# Patient Record
Sex: Male | Born: 1975 | Race: Black or African American | Hispanic: No | Marital: Single | Smoking: Current some day smoker
Health system: Southern US, Community
[De-identification: ages and names within clinical notes are randomized; demographics above are authoritative.]

## PROBLEM LIST (undated history)

## (undated) ENCOUNTER — Emergency Department (HOSPITAL_COMMUNITY): Discharge status: Against Medical Advice | Payer: No Typology Code available for payment source

## (undated) DIAGNOSIS — F329 Major depressive disorder, single episode, unspecified: Secondary | ICD-10-CM

## (undated) DIAGNOSIS — F200 Paranoid schizophrenia: Secondary | ICD-10-CM

## (undated) DIAGNOSIS — S42309A Unspecified fracture of shaft of humerus, unspecified arm, initial encounter for closed fracture: Secondary | ICD-10-CM

## (undated) DIAGNOSIS — M792 Neuralgia and neuritis, unspecified: Secondary | ICD-10-CM

## (undated) DIAGNOSIS — F419 Anxiety disorder, unspecified: Secondary | ICD-10-CM

## (undated) DIAGNOSIS — F32A Depression, unspecified: Secondary | ICD-10-CM

## (undated) DIAGNOSIS — F4481 Dissociative identity disorder: Secondary | ICD-10-CM

## (undated) DIAGNOSIS — F4310 Post-traumatic stress disorder, unspecified: Secondary | ICD-10-CM

## (undated) HISTORY — DX: Paranoid schizophrenia: F20.0

## (undated) HISTORY — PX: KNEE SURGERY: SHX244

---

## 2015-07-18 ENCOUNTER — Emergency Department (HOSPITAL_COMMUNITY)
Admission: EM | Admit: 2015-07-18 | Discharge: 2015-07-18 | Disposition: A | Discharge status: Home / Self Care | Payer: Self-pay | Attending: Emergency Medicine | Admitting: Emergency Medicine

## 2015-07-18 ENCOUNTER — Emergency Department (HOSPITAL_COMMUNITY): Payer: Self-pay

## 2015-07-18 ENCOUNTER — Encounter (HOSPITAL_COMMUNITY): Payer: Self-pay | Admitting: *Deleted

## 2015-07-18 DIAGNOSIS — K219 Gastro-esophageal reflux disease without esophagitis: Secondary | ICD-10-CM | POA: Insufficient documentation

## 2015-07-18 DIAGNOSIS — R066 Hiccough: Secondary | ICD-10-CM

## 2015-07-18 DIAGNOSIS — Z760 Encounter for issue of repeat prescription: Secondary | ICD-10-CM | POA: Insufficient documentation

## 2015-07-18 DIAGNOSIS — F329 Major depressive disorder, single episode, unspecified: Secondary | ICD-10-CM | POA: Insufficient documentation

## 2015-07-18 DIAGNOSIS — F17210 Nicotine dependence, cigarettes, uncomplicated: Secondary | ICD-10-CM | POA: Insufficient documentation

## 2015-07-18 HISTORY — DX: Major depressive disorder, single episode, unspecified: F32.9

## 2015-07-18 HISTORY — DX: Dissociative identity disorder: F44.81

## 2015-07-18 HISTORY — DX: Post-traumatic stress disorder, unspecified: F43.10

## 2015-07-18 HISTORY — DX: Anxiety disorder, unspecified: F41.9

## 2015-07-18 HISTORY — DX: Neuralgia and neuritis, unspecified: M79.2

## 2015-07-18 HISTORY — DX: Depression, unspecified: F32.A

## 2015-07-18 LAB — BASIC METABOLIC PANEL
ANION GAP: 6 (ref 5–15)
BUN: 10 mg/dL (ref 6–20)
CHLORIDE: 107 mmol/L (ref 101–111)
CO2: 26 mmol/L (ref 22–32)
Calcium: 9.1 mg/dL (ref 8.9–10.3)
Creatinine, Ser: 0.9 mg/dL (ref 0.61–1.24)
GFR calc Af Amer: >60 mL/min (ref >60)
GLUCOSE: 90 mg/dL (ref 65–99)
POTASSIUM: 4.2 mmol/L (ref 3.5–5.1)
SODIUM: 139 mmol/L (ref 135–145)

## 2015-07-18 MED ORDER — CHLORPROMAZINE HCL 25 MG PO TABS
25.0000 mg | ORAL_TABLET | Freq: Once | ORAL | Status: AC
  Administered 2015-07-18: 25 mg via ORAL
  Filled 2015-07-18: qty 1

## 2015-07-18 MED ORDER — PANTOPRAZOLE SODIUM 40 MG PO TBEC
40.0000 mg | DELAYED_RELEASE_TABLET | Freq: Once | ORAL | Status: AC
  Administered 2015-07-18: 40 mg via ORAL
  Filled 2015-07-18: qty 1

## 2015-07-18 MED ORDER — FAMOTIDINE 20 MG PO TABS: 20.0000 mg | ORAL_TABLET | Freq: Two times a day (BID) | ORAL | Status: DC

## 2015-07-18 MED ORDER — GABAPENTIN 100 MG PO CAPS: 300.0000 mg | ORAL_CAPSULE | Freq: Three times a day (TID) | ORAL | Status: DC

## 2015-07-18 MED ORDER — GI COCKTAIL ~~LOC~~
30.0000 mL | Freq: Once | ORAL | Status: AC
  Administered 2015-07-18: 30 mL via ORAL
  Filled 2015-07-18: qty 30

## 2015-07-18 MED ORDER — CHLORPROMAZINE HCL 25 MG PO TABS: 25.0000 mg | ORAL_TABLET | Freq: Three times a day (TID) | ORAL | Status: DC | PRN

## 2015-07-18 NOTE — ED Notes (Signed)
Per pt report: pt has had hiccups for the past 48 hours.  Pt reports that in the past, pt has had them for a day and they have resolved. Pt also reports having severe nerve pain that is normally managed by Neurontin but pt has no PCP since getting out of prison 3 months ago.  Pt a/o x 4 and ambulatory.

## 2015-07-18 NOTE — ED Provider Notes (Signed)
CSN: [id number]     Arrival date & time 07/18/15  1002 History  By signing my name below, I, Renetta ChalkBobby Ross, attest that this documentation has been prepared under the direction and in the presence of [facility] Jammy Stlouis PA-C.  Electronically Signed: Renetta ChalkBobby Ross, ED Scribe. 07/18/2015. 12:24 PM.   Chief Complaint  Patient presents with  . Hiccups    The history is provided by the patient. No language interpreter was used.    HPI Comments: Danny Lester is a 40 y.o. male who presents to the Emergency Department complaining of unchanged, burning hiccups that have persisted for the past 48 hours. Pt states he has had difficulty sleeping due to the hiccups. Pt states he has had a Hx of hiccups but no episodes has lasted this long. Symptoms always worse at night.  Pt denies eating any spicy foods in the past 3 days. Pt also reports a "sour taste" in the back of his throat and denies taking any medication for acid reflux. Has occasional cough and abdominal discomfort.  Pt has taken TUMS with no relief. Pt states he has had 4 bowel movements per day, for the last 4 days. Pt states he normally has two bowel movements per day. Pt further states he was recently prescribed Zoloft 50mg  and Risperdal 3mg  due to bipolar disorder, multiple personality disorder, paranoid schizophrenia, prescribed [facility].    Pt states he recently was released from a penitentiary and has run out of his nerve medication. Pt states he normally takes Neurontin 900mg  tid but has not taken it in the last 2 weeks. Pt states he was unaware he could go to the doctor without insurance to receive a refill of his medication. Pt reports he takes Neurontin due to a remote neck injury.     Past Medical History  Diagnosis Date  . Nerve pain   . Depression   . PTSD (post-traumatic stress disorder)   . Anxiety   . Multiple personality disorder    Past Surgical History  Procedure Laterality Date  . Knee surgery     No family history on file. Social  History  Substance Use Topics  . Smoking status: Current Every Day Smoker -- 0.10 packs/day    Types: Cigarettes  . Smokeless tobacco: None  . Alcohol Use: Yes    Review of Systems  Constitutional: Negative for fever, activity change and appetite change.  Respiratory: Positive for cough.   Gastrointestinal: Positive for nausea and abdominal pain (discomfort). Negative for vomiting, diarrhea and constipation.  Skin: Positive for rash (back). Negative for color change.  Allergic/Immunologic: Negative for immunocompromised state.  Hematological: Does not bruise/bleed easily.  Psychiatric/Behavioral: Positive for sleep disturbance. Negative for self-injury.    Allergies  Penicillins  Home Medications   Prior to Admission medications   Not on File   BP 124/84 mmHg  Pulse 49  Temp(Src) 98 F (36.7 C) (Oral)  Resp 16  Ht 5\' 6"  (1.676 m)  Wt 170 lb (77.111 kg)  BMI 27.45 kg/m2  SpO2 100% Physical Exam  Constitutional: He appears well-developed and well-nourished. No distress.  HENT:  Head: Normocephalic and atraumatic.  Neck: Neck supple.  Cardiovascular: Normal rate and regular rhythm.   Pulmonary/Chest: Effort normal and breath sounds normal. No respiratory distress. He has no wheezes. He has no rales.  Abdominal: Soft. He exhibits no distension and no mass. There is no tenderness. There is no rebound and no guarding.  Musculoskeletal: Normal range of motion. He exhibits no edema.  Neurological: He is alert. He exhibits normal muscle tone.  Skin: He is not diaphoretic.  Nursing note and vitals reviewed.   ED Course  Procedures  DIAGNOSTIC STUDIES: Oxygen Saturation is 100% on RA, normal by my interpretation.  COORDINATION OF CARE: 12:15 PM-Will order medication and imaging. Discussed treatment plan with pt at bedside and pt agreed to plan.   Labs Review Labs Reviewed  BASIC METABOLIC PANEL  I-STAT CHEM 8, ED    Imaging Review Dg Chest 2 View  07/18/2015   CLINICAL DATA:  Intractable hiccups.  Chest pain EXAM: CHEST  2 VIEW COMPARISON:  None. FINDINGS: Lungs are clear. Heart size and pulmonary vascularity are normal. No adenopathy. No pneumothorax. No bone lesions. IMPRESSION: No edema or consolidation. Electronically Signed   By: Bretta Bang III M.D.   On: 07/18/2015 12:53   I have personally reviewed and evaluated these images and lab results as part of my medical decision-making.   EKG Interpretation None      MDM   Final diagnoses:  Hiccups  Medication refill  Gastroesophageal reflux disease, esophagitis presence not specified   Afebrile, nontoxic patient with hiccups x 48 hours, with frequent hiccups that seem to be associated with reflux symptoms.  GI cocktail, protonix given.  CXR negative.  BMP normal.   Discussed and provided PCP and community resources for the patient.   D/C home with thorazine, pepcid, short term neurontin refill, resources for follow up.  Discussed result, findings, treatment, and follow up  with patient.  Pt given return precautions.  Pt verbalizes understanding and agrees with plan.        I personally performed the services described in this documentation, which was scribed in my presence. The recorded information has been reviewed and is accurate.     Trixie Dredge, PA-C 07/18/15 1458  Vanetta Mulders, MD 07/21/15 1402

## 2015-07-18 NOTE — Discharge Instructions (Signed)
Read the information below.  Use the prescribed medication as directed.  Please discuss all new medications with your pharmacist.  You may return to the Emergency Department at any time for worsening condition or any new symptoms that concern you.     Food Choices for Gastroesophageal Reflux Disease, Adult When you have gastroesophageal reflux disease (GERD), the foods you eat and your eating habits are very important. Choosing the right foods can help ease the discomfort of GERD. WHAT GENERAL GUIDELINES DO I NEED TO FOLLOW?  Choose fruits, vegetables, whole grains, low-fat dairy products, and low-fat meat, fish, and poultry.  Limit fats such as oils, salad dressings, butter, nuts, and avocado.  Keep a food diary to identify foods that cause symptoms.  Avoid foods that cause reflux. These may be different for different people.  Eat frequent small meals instead of three large meals each day.  Eat your meals slowly, in a relaxed setting.  Limit fried foods.  Cook foods using methods other than frying.  Avoid drinking alcohol.  Avoid drinking large amounts of liquids with your meals.  Avoid bending over or lying down until 2-3 hours after eating. WHAT FOODS ARE NOT RECOMMENDED? The following are some foods and drinks that may worsen your symptoms: Vegetables Tomatoes. Tomato juice. Tomato and spaghetti sauce. Chili peppers. Onion and garlic. Horseradish. Fruits Oranges, grapefruit, and lemon (fruit and juice). Meats High-fat meats, fish, and poultry. This includes hot dogs, ribs, ham, sausage, salami, and bacon. Dairy Whole milk and chocolate milk. Sour cream. Cream. Butter. Ice cream. Cream cheese.  Beverages Coffee and tea, with or without caffeine. Carbonated beverages or energy drinks. Condiments Hot sauce. Barbecue sauce.  Sweets/Desserts Chocolate and cocoa. Donuts. Peppermint and spearmint. Fats and Oils High-fat foods, including [place] fries and potato  chips. Other Vinegar. Strong spices, such as black pepper, white pepper, red pepper, cayenne, curry powder, cloves, ginger, and chili powder. The items listed above may not be a complete list of foods and beverages to avoid. Contact your dietitian for more information.   This information is not intended to replace advice given to you by your health care provider. Make sure you discuss any questions you have with your health care provider.   Document Released: 12/26/2004 Document Revised: 01/16/2014 Document Reviewed: 10/30/2012 Elsevier Interactive Patient Education 2016 Elsevier Inc.  Gastroesophageal Reflux Disease, Adult Normally, food travels down the esophagus and stays in the stomach to be digested. However, when a person has gastroesophageal reflux disease (GERD), food and stomach acid move back up into the esophagus. When this happens, the esophagus becomes sore and inflamed. Over time, GERD can create small holes (ulcers) in the lining of the esophagus.  CAUSES This condition is caused by a problem with the muscle between the esophagus and the stomach (lower esophageal sphincter, or LES). Normally, the LES muscle closes after food passes through the esophagus to the stomach. When the LES is weakened or abnormal, it does not close properly, and that allows food and stomach acid to go back up into the esophagus. The LES can be weakened by certain dietary substances, medicines, and medical conditions, including:  Tobacco use.  Pregnancy.  Having a hiatal hernia.  Heavy alcohol use.  Certain foods and beverages, such as coffee, chocolate, onions, and peppermint. RISK FACTORS This condition is more likely to develop in:  People who have an increased body weight.  People who have connective tissue disorders.  People who use NSAID medicines. SYMPTOMS Symptoms of this condition include:  Heartburn.  Difficult or painful swallowing.  The feeling of having a lump in the  throat.  Abitter taste in the mouth.  Bad breath.  Having a large amount of saliva.  Having an upset or bloated stomach.  Belching.  Chest pain.  Shortness of breath or wheezing.  Ongoing (chronic) cough or a night-time cough.  Wearing away of tooth enamel.  Weight loss. Different conditions can cause chest pain. Make sure to see your health care provider if you experience chest pain. DIAGNOSIS Your health care provider will take a medical history and perform a physical exam. To determine if you have mild or severe GERD, your health care provider may also monitor how you respond to treatment. You may also have other tests, including:  An endoscopy toexamine your stomach and esophagus with a small camera.  A test thatmeasures the acidity level in your esophagus.  A test thatmeasures how much pressure is on your esophagus.  A barium swallow or modified barium swallow to show the shape, size, and functioning of your esophagus. TREATMENT The goal of treatment is to help relieve your symptoms and to prevent complications. Treatment for this condition may vary depending on how severe your symptoms are. Your health care provider may recommend:  Changes to your diet.  Medicine.  Surgery. HOME CARE INSTRUCTIONS Diet  Follow a diet as recommended by your health care provider. This may involve avoiding foods and drinks such as:  Coffee and tea (with or without caffeine).  Drinks that containalcohol.  Energy drinks and sports drinks.  Carbonated drinks or sodas.  Chocolate and cocoa.  Peppermint and mint flavorings.  Garlic and onions.  Horseradish.  Spicy and acidic foods, including peppers, chili powder, curry powder, vinegar, hot sauces, and barbecue sauce.  Citrus fruit juices and citrus fruits, such as oranges, lemons, and limes.  Tomato-based foods, such as red sauce, chili, salsa, and pizza with red sauce.  Fried and fatty foods, such as donuts,  french fries, potato chips, and high-fat dressings.  High-fat meats, such as hot dogs and fatty cuts of red and white meats, such as rib eye steak, sausage, ham, and bacon.  High-fat dairy items, such as whole milk, butter, and cream cheese.  Eat small, frequent meals instead of large meals.  Avoid drinking large amounts of liquid with your meals.  Avoid eating meals during the 2-3 hours before bedtime.  Avoid lying down right after you eat.  Do not exercise right after you eat. General Instructions  Pay attention to any changes in your symptoms.  Take over-the-counter and prescription medicines only as told by your health care provider. Do not take aspirin, ibuprofen, or other NSAIDs unless your health care provider told you to do so.  Do not use any tobacco products, including cigarettes, chewing tobacco, and e-cigarettes. If you need help quitting, ask your health care provider.  Wear loose-fitting clothing. Do not wear anything tight around your waist that causes pressure on your abdomen.  Raise (elevate) the head of your bed 6 inches (15cm).  Try to reduce your stress, such as with yoga or meditation. If you need help reducing stress, ask your health care provider.  If you are overweight, reduce your weight to an amount that is healthy for you. Ask your health care provider for guidance about a safe weight loss goal.  Keep all follow-up visits as told by your health care provider. This is important. SEEK MEDICAL CARE IF:  You have new symptoms.  You  have unexplained weight loss.  You have difficulty swallowing, or it hurts to swallow.  You have wheezing or a persistent cough.  Your symptoms do not improve with treatment.  You have a hoarse voice. SEEK IMMEDIATE MEDICAL CARE IF:  You have pain in your arms, neck, jaw, teeth, or back.  You feel sweaty, dizzy, or light-headed.  You have chest pain or shortness of breath.  You vomit and your vomit looks like  blood or coffee grounds.  You faint.  Your stool is bloody or black.  You cannot swallow, drink, or eat.   This information is not intended to replace advice given to you by your health care provider. Make sure you discuss any questions you have with your health care provider.   Document Released: 10/05/2004 Document Revised: 09/16/2014 Document Reviewed: 04/22/2014 Elsevier Interactive Patient Education 2016 [employer].  Hiccups A hiccup is the result of a sudden shortening of the muscle below your lungs (diaphragm). This movement of your diaphragm causes a sudden inhalation followed by the closing of your vocal cords, which causes the hiccup sound. Most people get the hiccups. Typically, hiccups last only a short amount of time.  There are three types of hiccups:   Benign. These hiccups last less than 48 hours.   Persistent. These hiccups last more than 48 hours, but less than 1 month.   Intractable. These hiccups last more than 1 month.  A hiccup is a reflex. You cannot control reflexes.  HOME CARE INSTRUCTIONS  Watch your hiccups for any changes. The following actions may help to lessen any discomfort that you are feeling:  Eat small meals.   Limit alcohol intake to no more than 1 drink per day for nonpregnant women and 2 drinks per day for men. One drink equals 12 oz of beer, 5 oz of wine, or 1 oz of hard liquor.  Limit drinking carbonated or fizzy drinks, such as soda.  Eat and chew your food slowly.   Avoid eating or drinking hot or spicy foods and drinks.  Take medicines only as directed by your health care provider.  SEEK MEDICAL CARE IF:   Your hiccups last for more than 48 hours.   Your hiccups do not improve with treatment.  You cannot sleep or eat due to the hiccups.   You have unexpected weight loss due to the hiccups.   You have a fever.   You have trouble breathing or swallowing.   You develop severe pain in your abdomen.  You  develop numbness, tingling, or weakness.   This information is not intended to replace advice given to you by your health care provider. Make sure you discuss any questions you have with your health care provider.   Document Released: 03/06/2001 Document Revised: 05/12/2014 Document Reviewed: 12/22/2013 Elsevier Interactive Patient Education 2016 [employer].   [employer] Assistance [employer] 211 is a great source of information about community services available.  Access by dialing 2-1-1 from anywhere in  [place], or by website -  PooledIncome.pl.   Other Local Resources (Updated 01/2015)  Financial Assistance   Services    Phone Number and Address  [facility]  Low-cost medical care - 1st and 3rd Saturday of every month  Must not qualify for public or private insurance and must have limited income [phone] [address] [place], [place]    [place] [employer] of Social Services  Child care  Emergency assistance for housing and utilities  Food stamps  Medicaid [phone] [address] [place], [place] [address]   [facility] Department  Low-cost medical care for children, communicable diseases, sexually-transmitted diseases, immunizations, maternity care, womens health and family planning [phone] [address] [place], [place] [address]  [facility]   Medication assistance for [facility] residents  Must meet income requirements [phone] [address] [place], [place].    [facility] Social Services  Child care  Emergency assistance for housing and [employer]  Medicaid [id number] [address] [place], [place] [address]  [facility]   Low-cost medical care,   Monday through Friday, 9 am to 6 pm.   Accepts Medicare/Medicaid, and self-pay [phone] [place], [place] [address]  [facility]  Low-cost medical care - Monday through Friday, 8:30 am - 5:30 pm  Accepts Medicaid and self-pay [phone] [address] [place], [place] [address]   [facility]  Primary medical care, including for those with sickle cell disease  Accepts Medicare, Medicaid, insurance and self-pay [phone] [address] [place], [place]  [facility]   Primary medical care  Accepts Medicare, [place], insurance and self-pay [phone] [address] [place], [place] [address]   [facility] Department of Social Services  Child care  Emergency assistance for housing and [employer]  Medicaid [phone] [address] [place], [place] [address]  [facility] Department of Health and [employer]  Child care  Emergency assistance for housing and [employer]  Medicaid [phone] [address] [place], [place] [address]   [facility] Medication Assistance Program  Medication assistance for [facility] residents with no insurance only  Must have a primary care doctor [phone] [address] [place], [place]  [facility]   Primary medical care  [place], [place], insurance  [phone] [address] [place], [place]  [facility]   Medication assistance [phone]  [facility]   Primary medical care  Accepts Medicare, [place], insurance and self-pay [phone] [address] [place], [place] [address]  [facility]   Primary medical care  Accepts Medicare, [place], insurance and self-pay [phone] [address] [place], [place] [address]  [facility]  For [place] residents between the ages of 73 and 44 who do not have any form of health insurance, Medicare, [place], or [place]  benefits.  Services are provided free of charge to uninsured patients who fall within federal poverty guidelines.    Hours: Tuesdays and Thursdays, 4:15 - 8 pm [phone] [address] [place], [place] [address]  [facility]     Primary medical care  Dental care  Nutritional counseling  Pharmacy  Accepts Medicaid, Medicare, most insurance.  Fees are adjusted based on ability to pay.   [phone] [facility] [address] [place], [place]  [phone] [facility] [address] [place], [place]  [phone] [facility] [place], [place]  [phone] [facility], [address] [place], [place]  [phone] [facility] [address] [place], [place]  [facility]  Womens health and family planning [phone] [address] [place], [place]  [facility] Department of Social Services  Child care  Emergency assistance for housing and utilities  Food stamps  Medicaid [phone] [address] [place], [place] [address]   [facility]    Ages 87 and older  Hours: Mondays and Thursdays, 7:00 am - 9:00 am Patients are seen on a first come, first served basis. [phone], ext. [phone] [place], [place]  [facility] Division of Social Services  Child care  Emergency assistance for housing and [employer]  Medicaid [phone] [address] [place], [place] [address]  [facility]  Medication assistance  Rental assistance  Food pantry  Medication assistance  Housing assistance  Emergency food distribution  Utility assistance [phone] [address] [place], [place]  [phone]  [address] [place], [place] [address] Hours: Tuesdays and Thursdays from 9am - 12 noon by appointment only  [phone] [address] [place], [place] [address]  [facility] - Lanae Boast   Accepts private insurance, [place], and [place].  Payment is based on a sliding scale for those without insurance.  Hours: Mondays, Tuesdays and Thursdays, 8:30 am - 5:30 pm.   [phone] [address] [place], [place]  [facility] - Family Medicine at [facility], [place], and [place].  Payment is based on a sliding scale for those without insurance. [phone] [address] [place], [place]  [facility] - Pediatrics at [address]. Scientist, research (physical sciences), [employer], and [place].  Payment is based on a sliding scale for those without insurance [phone] [address], [employer], [place]  [facility] - Pediatrics at [employer], [place], and [place].  Payment is based on a sliding scale for those without insurance. [phone] [place], [place]  [facility] - Pediatrics at [facility], [place], and [place].  Payment is based on a sliding scale for those without insurance. [phone], ext. [phone] [place], [place].    [facility]  Maternity care.  Accepts Medicaid and self-pay. [phone] [address] [place], [place]

## 2015-07-18 NOTE — ED Notes (Signed)
Bed: WA27 Expected date:  Expected time:  Means of arrival:  Comments: 

## 2015-07-28 ENCOUNTER — Ambulatory Visit: Payer: Self-pay

## 2015-08-19 ENCOUNTER — Encounter (HOSPITAL_COMMUNITY): Payer: Self-pay | Admitting: Emergency Medicine

## 2015-08-19 ENCOUNTER — Emergency Department (HOSPITAL_COMMUNITY)
Admission: EM | Admit: 2015-08-19 | Discharge: 2015-08-19 | Disposition: A | Discharge status: Home / Self Care | Payer: Self-pay | Attending: Emergency Medicine | Admitting: Emergency Medicine

## 2015-08-19 DIAGNOSIS — M5412 Radiculopathy, cervical region: Secondary | ICD-10-CM | POA: Insufficient documentation

## 2015-08-19 DIAGNOSIS — Y999 Unspecified external cause status: Secondary | ICD-10-CM | POA: Insufficient documentation

## 2015-08-19 DIAGNOSIS — Y939 Activity, unspecified: Secondary | ICD-10-CM | POA: Insufficient documentation

## 2015-08-19 DIAGNOSIS — M542 Cervicalgia: Secondary | ICD-10-CM

## 2015-08-19 DIAGNOSIS — Y92149 Unspecified place in prison as the place of occurrence of the external cause: Secondary | ICD-10-CM | POA: Insufficient documentation

## 2015-08-19 DIAGNOSIS — F17210 Nicotine dependence, cigarettes, uncomplicated: Secondary | ICD-10-CM | POA: Insufficient documentation

## 2015-08-19 MED ORDER — GABAPENTIN 300 MG PO CAPS: 300.0000 mg | ORAL_CAPSULE | Freq: Three times a day (TID) | ORAL | 0 refills | Status: DC

## 2015-08-19 MED ORDER — CIPROFLOXACIN-DEXAMETHASONE 0.3-0.1 % OT SUSP: 4.0000 [drp] | Freq: Two times a day (BID) | OTIC | 0 refills | Status: DC

## 2015-08-19 NOTE — Discharge Instructions (Addendum)
Please use medication as directed, please follow-up with neurosurgery for reevaluation further management. Please return to the emergency room immediately if he experiences any new or worsening signs or symptoms

## 2015-08-19 NOTE — Progress Notes (Deleted)
Pt with [facility] ED visits x 2 and no admissions Pt last Ed visit on 07/18/15  Pt seen today by Stacy of [facility]) Given a [facility] application, resources, information on [facility] at [place],  list of [place] self pay and stacy's [facility] contact information for further questions Pt had informed Stacy that during his last ED visit an appt had been made for him at [facility] and he went there and there was not appt scheduled for him. ED CM reviewed pt 07/18/15 d/c instructions to find.  This is not true Pt was given d/c instructions to contact the [facility] and/or [facility] to scheduled himself an appt   

## 2015-08-19 NOTE — ED Triage Notes (Signed)
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 States he was put in a hold while he was in the penitentiary, since then has had tingling and sharp pains in distal extremities. Takes Neurontin but is out. Ambulatory at triage. A/O.

## 2015-08-19 NOTE — Progress Notes (Signed)
[facility] and no admissions Pt last Ed visit on 07/18/15  Pt seen today by Kennyth ArnoldStacy of partnership for community care network [facility]) Given a Passenger transport manager4Cc application, resources, information on [facility] at [place],  [employer] [place] self pay and stacy's [facility] contact information for further questions Pt had informed Kennyth ArnoldStacy that during his last ED visit an app[facility] had been [facility] for [facility] and he went there and there was not appt scheduled for him. ED CM reviewed pt 07/18/15 d/c instructions to find.  This is not true Pt was given d/c instructions [facility] and[facility]or [facility] to scheduled himself an appt

## 2015-08-19 NOTE — ED Provider Notes (Signed)
[facility]-EMERGENCY DEPT Provider Note   CSN: [id number] Arrival date & time: 08/19/15  [id number]  First Provider Contact:  None       History   Chief Complaint Chief Complaint  Patient presents with  . Neck Pain  . Back Pain    HPI Danny Lester is a 40 y.o. male.  HPI   40 year old male presents today with neck and back pain. Patient reports that in 2009 when he was incarcerated he was in an altered location where cards placed him in a neck hold. He reports at that time he was diagnosed with a pinched nerve and Tingling in his arms and legs. Patient notes that he's been seeing the doctor at the prison with MRI showing nerve impingement, but no surgical necessity. Patient reports since that time he has continued use Neurontin for nerve symptoms which has been helping. He notes the symptoms have progressively gotten worse, he notes worsening with sleeping as he sleeps with his arms to his chest this causes increased tingling in his fingers. He was placed in wrist splints for this which seems to be improving symptoms. Patient also notes that the lower extremity symptoms have been worsening with burning in his thighs, this is worse after prolonged periods of sitting in cars or laying in the bed. He notes symptoms are improved with movement. He denies any focal lower extremity strength deficits, denies any bowel or bladder incontinence, fever, IV drug use, or any other red flags at this moment. [facility] but was unable to be seen, he has an appointment next month for reevaluation.   Past Medical History:  Diagnosis Date  . Anxiety   . Depression   . Multiple personality disorder   . Nerve pain   . PTSD (post-traumatic stress disorder)     There are no active problems to display for this patient.   Past Surgical History:  Procedure Laterality Date  . KNEE SURGERY        Home Medications    Prior to Admission medications   Medication Sig  Start Date End Date Taking? Authorizing Provider  risperiDONE (RISPERDAL) 2 MG tablet Take 2 mg by mouth at bedtime.   Yes Historical Provider, MD  chlorproMAZINE (THORAZINE) 25 MG tablet Take 1-2 tablets (25-50 mg total) by mouth 3 (three) times daily as needed for hiccoughs. Patient not taking: Reported on 08/19/2015 07/18/15   Trixie DredgeEmily West, PA-C  famotidine (PEPCID) 20 MG tablet Take 1 tablet (20 mg total) by mouth 2 (two) times daily. Patient not taking: Reported on 08/19/2015 07/18/15   Trixie DredgeEmily West, PA-C  gabapentin (NEURONTIN) 300 MG capsule Take 1 capsule (300 mg total) by mouth 3 (three) times daily. 08/19/15   Eyvonne MechanicJeffrey Isamar Nazir, PA-C    Family History No family history on file.  Social History Social History  Substance Use Topics  . Smoking status: Current Every Day Smoker    Packs/day: 0.10    Types: Cigarettes  . Smokeless tobacco: Never Used  . Alcohol use Yes     Allergies   Penicillins   Review of Systems Review of Systems  All other systems reviewed and are negative.    Physical Exam Updated Vital Signs BP 109/83   Pulse (!) 51   Temp 97.9 F (36.6 C) (Oral)   Resp 18   SpO2 100%   Physical Exam  Constitutional: He is oriented to person, place, and time. He appears well-developed and well-nourished.  HENT:  Head:  Normocephalic and atraumatic.  Eyes: Conjunctivae are normal. Pupils are equal, round, and reactive to light. Right eye exhibits no discharge. Left eye exhibits no discharge. No scleral icterus.  Neck: Normal range of motion. No JVD present. No tracheal deviation present.  Pulmonary/Chest: Effort normal. No stridor.  Musculoskeletal:  Minimal tenderness to palpation of lower C-spine and surrounding soft tissue, generalized tenderness to remainder of back, no focal abnormalities. Patient has bilateral upper and lower extremity strength 5 out of 5 sensation is grossly intact, patellar reflexes are 2+. Straight leg negative  Neurological: He is alert and  oriented to person, place, and time. Coordination normal.  Psychiatric: He has a normal mood and affect. His behavior is normal. Judgment and thought content normal.  Nursing note and vitals reviewed.    ED Treatments / Results  Labs (all labs ordered are listed, but only abnormal results are displayed) Labs Reviewed  URINALYSIS, ROUTINE W REFLEX MICROSCOPIC (NOT AT [facility])    EKG  EKG Interpretation None       Radiology No results found.  Procedures Procedures (including critical care time)  Medications Ordered in ED Medications - No data to display   Initial Impression / Assessment and Plan / ED Course  I have reviewed the triage vital signs and the nursing notes.  Pertinent labs & imaging results that were available during my care of the patient were reviewed by me and considered in my medical decision making (see chart for details).  Clinical Course     Final Clinical Impressions(s) / ED Diagnoses   Final diagnoses:  Neck pain  Cervical radiculopathy    Labs:   Imaging:  Consults:  Therapeutics:   Discharge Meds:   Assessment/Plan: 40 year old male presents today with neck and back pain with neurological complaints. The symptoms have been going on since 2009 with slow progression. Patient likely has some degree of impingement in his cervical spine causing the symptoms. Patient has no rapid progression of symptoms, can maintain activities of daily living without difficulty. Patient has no immediate red flags here. Patient will need neurosurgical evaluation as an outpatient, he will be given a prescription for Neurontin, encouraged follow-up with neurosurgery for reevaluation. Patient spoke with hospital staff and was provided information for an orange card, he has a follow-up appointment at [facility] next month. Patient is given strict return precautions, he verbalized understanding and agreement to today's plan had no further questions or concerns  at the time discharge     New Prescriptions New Prescriptions   GABAPENTIN (NEURONTIN) 300 MG CAPSULE    Take 1 capsule (300 mg total) by mouth 3 (three) times daily.     Eyvonne Mechanic, PA-C 08/19/15 [address] PA-C 08/19/15 [address] PA-C 08/19/15 1153    Nira Conn, MD 08/21/15 1134

## 2015-08-19 NOTE — ED Notes (Signed)
At the end of triage pt states he is having some dysuria. Urine ordered.

## 2015-09-03 ENCOUNTER — Encounter (HOSPITAL_COMMUNITY): Payer: Self-pay | Admitting: Emergency Medicine

## 2015-09-03 ENCOUNTER — Emergency Department (HOSPITAL_COMMUNITY)
Admission: EM | Admit: 2015-09-03 | Discharge: 2015-09-03 | Disposition: A | Discharge status: Home / Self Care | Payer: Self-pay | Attending: Emergency Medicine | Admitting: Emergency Medicine

## 2015-09-03 DIAGNOSIS — T426X5A Adverse effect of other antiepileptic and sedative-hypnotic drugs, initial encounter: Secondary | ICD-10-CM | POA: Insufficient documentation

## 2015-09-03 DIAGNOSIS — G251 Drug-induced tremor: Secondary | ICD-10-CM | POA: Insufficient documentation

## 2015-09-03 DIAGNOSIS — T50905A Adverse effect of unspecified drugs, medicaments and biological substances, initial encounter: Secondary | ICD-10-CM

## 2015-09-03 DIAGNOSIS — F17210 Nicotine dependence, cigarettes, uncomplicated: Secondary | ICD-10-CM | POA: Insufficient documentation

## 2015-09-03 LAB — COMPREHENSIVE METABOLIC PANEL
ALK PHOS: 68 U/L (ref 38–126)
ALT: 23 U/L (ref 17–63)
AST: 27 U/L (ref 15–41)
Albumin: 4.3 g/dL (ref 3.5–5.0)
Anion gap: 8 (ref 5–15)
BUN: 12 mg/dL (ref 6–20)
CALCIUM: 9.4 mg/dL (ref 8.9–10.3)
CHLORIDE: 106 mmol/L (ref 101–111)
CO2: 24 mmol/L (ref 22–32)
CREATININE: 0.79 mg/dL (ref 0.61–1.24)
Glucose, Bld: 89 mg/dL (ref 65–99)
Potassium: 3.8 mmol/L (ref 3.5–5.1)
SODIUM: 138 mmol/L (ref 135–145)
Total Bilirubin: 0.9 mg/dL (ref 0.3–1.2)
Total Protein: 8.1 g/dL (ref 6.5–8.1)

## 2015-09-03 LAB — CBC WITH DIFFERENTIAL/PLATELET
Basophils Absolute: 0 10*3/uL (ref 0.0–0.1)
Basophils Relative: 0 %
EOS ABS: 0 10*3/uL (ref 0.0–0.7)
EOS PCT: 0 %
HCT: 40.1 % (ref 39.0–52.0)
HEMOGLOBIN: 13.8 g/dL (ref 13.0–17.0)
LYMPHS ABS: 2.2 10*3/uL (ref 0.7–4.0)
Lymphocytes Relative: 12 %
MCH: 29.1 pg (ref 26.0–34.0)
MCHC: 34.4 g/dL (ref 30.0–36.0)
MCV: 84.4 fL (ref 78.0–100.0)
MONOS PCT: 8 %
Monocytes Absolute: 1.6 10*3/uL — ABNORMAL HIGH (ref 0.1–1.0)
Neutro Abs: 15.6 10*3/uL — ABNORMAL HIGH (ref 1.7–7.7)
Neutrophils Relative %: 80 %
PLATELETS: 263 10*3/uL (ref 150–400)
RBC: 4.75 MIL/uL (ref 4.22–5.81)
RDW: 16.5 % — ABNORMAL HIGH (ref 11.5–15.5)
WBC: 19.4 10*3/uL — ABNORMAL HIGH (ref 4.0–10.5)

## 2015-09-03 LAB — VALPROIC ACID LEVEL: Valproic Acid Lvl: <10 ug/mL — ABNORMAL LOW (ref 50.0–100.0)

## 2015-09-03 MED ORDER — DIPHENHYDRAMINE HCL 50 MG/ML IJ SOLN
25.0000 mg | Freq: Once | INTRAMUSCULAR | Status: AC
  Administered 2015-09-03: 25 mg via INTRAVENOUS
  Filled 2015-09-03: qty 1

## 2015-09-03 MED ORDER — SODIUM CHLORIDE 0.9 % IV SOLN: INTRAVENOUS | Status: DC

## 2015-09-03 MED ORDER — BENZTROPINE MESYLATE 1 MG/ML IJ SOLN
1.0000 mg | Freq: Once | INTRAMUSCULAR | Status: AC
  Administered 2015-09-03: 1 mg via INTRAVENOUS
  Filled 2015-09-03: qty 2

## 2015-09-03 MED ORDER — BENZTROPINE MESYLATE 1 MG PO TABS: 1.0000 mg | ORAL_TABLET | Freq: Two times a day (BID) | ORAL | 0 refills | Status: DC | PRN

## 2015-09-03 NOTE — ED Triage Notes (Signed)
Pt's depakote was recently increased from 250mg  to 500mg . Afterwards pt began to have uncontrolable R arm movement and anxiety. Takes medication for psychiatric condition.

## 2015-09-03 NOTE — ED Notes (Signed)
Pt told writer that "ya'll aint gonna all draw my blood" and got upset with me.

## 2015-09-03 NOTE — ED Provider Notes (Addendum)
[facility] DEPT Provider Note   CSN: [id number] Arrival date & time: 09/03/15  1700     History   Chief Complaint Chief Complaint  Patient presents with  . Medication Problem    HPI Shaiden Loleta ChanceHill is a 40 y.o. male.  40 year old male presents with son onset of right upper extremity tremors which began after taking a dose of Depakote. [facility] and had his dose of Depakote-and as was his first dose of that new regimen. States that he immediately began to have twitching of his right arm. Denies any headache. No vomiting. Denies any ataxia. No diarrhea noted. Patient also takes Resperdal and has been compliant with his medication. Denies any illicit drug use. Symptoms have been progressively worse throughout the entire day and no treatment use prior to arrival.      Past Medical History:  Diagnosis Date  . Anxiety   . Depression   . Multiple personality disorder   . Nerve pain   . PTSD (post-traumatic stress disorder)     There are no active problems to display for this patient.   Past Surgical History:  Procedure Laterality Date  . KNEE SURGERY         Home Medications    Prior to Admission medications   Medication Sig Start Date End Date Taking? Authorizing Provider  chlorproMAZINE (THORAZINE) 25 MG tablet Take 1-2 tablets (25-50 mg total) by mouth 3 (three) times daily as needed for hiccoughs. Patient not taking: Reported on 08/19/2015 07/18/15   Trixie DredgeEmily West, PA-C  famotidine (PEPCID) 20 MG tablet Take 1 tablet (20 mg total) by mouth 2 (two) times daily. Patient not taking: Reported on 08/19/2015 07/18/15   Trixie DredgeEmily West, PA-C  gabapentin (NEURONTIN) 300 MG capsule Take 1 capsule (300 mg total) by mouth 3 (three) times daily. 08/19/15   Eyvonne MechanicJeffrey Hedges, PA-C  risperiDONE (RISPERDAL) 2 MG tablet Take 2 mg by mouth at bedtime.    Historical Provider, MD    Family History History reviewed. No pertinent family history.  Social  History Social History  Substance Use Topics  . Smoking status: Current Every Day Smoker    Packs/day: 0.10    Types: Cigarettes  . Smokeless tobacco: Never Used  . Alcohol use Yes     Allergies   Penicillins   Review of Systems Review of Systems  All other systems reviewed and are negative.    Physical Exam Updated Vital Signs BP 129/67 (BP Location: Left Arm)   Pulse 88   Temp 99.5 F (37.5 C) (Oral)   Resp 20   SpO2 99%   Physical Exam  Constitutional: He is oriented to person, place, and time. He appears well-developed and well-nourished.  Non-toxic appearance. No distress.  HENT:  Head: Normocephalic and atraumatic.  Eyes: Conjunctivae, EOM and lids are normal. Pupils are equal, round, and reactive to light.  Neck: Normal range of motion. Neck supple. No tracheal deviation present. No thyroid mass present.  Cardiovascular: Normal rate, regular rhythm and normal heart sounds.  Exam reveals no gallop.   No murmur heard. Pulmonary/Chest: Effort normal and breath sounds normal. No stridor. No respiratory distress. He has no decreased breath sounds. He has no wheezes. He has no rhonchi. He has no rales.  Abdominal: Soft. Normal appearance and bowel sounds are normal. He exhibits no distension. There is no tenderness. There is no rebound and no CVA tenderness.  Musculoskeletal: Normal range of motion. He exhibits no edema or tenderness.  Neurological: He is alert and oriented to person, place, and time. He displays tremor. No cranial nerve deficit or sensory deficit. Coordination and gait normal. GCS eye subscore is 4. GCS verbal subscore is 5. GCS motor subscore is 6.  Right ue tremors noted  Skin: Skin is warm and dry. No abrasion and no rash noted.  Psychiatric: His speech is normal and behavior is normal. His mood appears anxious.  Nursing note and vitals reviewed.    ED Treatments / Results  Labs (all labs ordered are listed, but only abnormal results are  displayed) Labs Reviewed  CBC WITH DIFFERENTIAL/PLATELET  COMPREHENSIVE METABOLIC PANEL  VALPROIC ACID LEVEL    EKG  EKG Interpretation None       Radiology No results found.  Procedures Procedures (including critical care time)  Medications Ordered in ED Medications  0.9 %  sodium chloride infusion (not administered)  benztropine mesylate (COGENTIN) injection 1 mg (not administered)  diphenhydrAMINE (BENADRYL) injection 25 mg (not administered)     Initial Impression / Assessment and Plan / ED Course  I have reviewed the triage vital signs and the nursing notes.  Pertinent labs & imaging results that were available during my care of the patient were reviewed by me and considered in my medical decision making (see chart for details).  Clinical Course   Patient's Depakote level not supratherapeutic Medication medicated with Benadryl and Cogentin. Symptoms have improved. Possible dystonic reaction. Will prescribe Cogentin  Final Clinical Impressions(s) / ED Diagnoses   Final diagnoses:  None    New Prescriptions New Prescriptions   No medications on file     Lorre Nick, MD 09/03/15 1950    Lorre Nick, MD 09/03/15 [phone]

## 2015-09-03 NOTE — Progress Notes (Signed)
Patient noted to have been seen in the ED 3 times within the last six months 0 admissions. [facility] rep for orange card enrollment in the recent past.   Noted patient has an [facility] at 1030am to establish care.  Placed on AVS. Patient's first appointment will be 10 dollars and will be given the application for the orange card. Patient is to bring all of his medications with him to this appointment. No further EDCM needs at this time.

## 2015-09-28 ENCOUNTER — Encounter: Payer: Self-pay | Admitting: Internal Medicine

## 2015-09-28 ENCOUNTER — Ambulatory Visit (INDEPENDENT_AMBULATORY_CARE_PROVIDER_SITE_OTHER): Payer: Self-pay | Admitting: Internal Medicine

## 2015-09-28 VITALS — BP 122/76 | HR 70 | Resp 18 | Ht 65.5 in | Wt 170.0 lb

## 2015-09-28 DIAGNOSIS — M5137 Other intervertebral disc degeneration, lumbosacral region: Secondary | ICD-10-CM | POA: Insufficient documentation

## 2015-09-28 DIAGNOSIS — K029 Dental caries, unspecified: Secondary | ICD-10-CM

## 2015-09-28 DIAGNOSIS — M542 Cervicalgia: Secondary | ICD-10-CM

## 2015-09-28 DIAGNOSIS — M5442 Lumbago with sciatica, left side: Secondary | ICD-10-CM

## 2015-09-28 DIAGNOSIS — M51379 Other intervertebral disc degeneration, lumbosacral region without mention of lumbar back pain or lower extremity pain: Secondary | ICD-10-CM | POA: Insufficient documentation

## 2015-09-28 DIAGNOSIS — M5441 Lumbago with sciatica, right side: Secondary | ICD-10-CM

## 2015-09-28 DIAGNOSIS — G5603 Carpal tunnel syndrome, bilateral upper limbs: Secondary | ICD-10-CM

## 2015-09-28 DIAGNOSIS — M722 Plantar fascial fibromatosis: Secondary | ICD-10-CM

## 2015-09-28 MED ORDER — IBUPROFEN 200 MG PO TABS: ORAL_TABLET | ORAL | 0 refills | Status: DC

## 2015-09-28 MED ORDER — GABAPENTIN 300 MG PO CAPS: 300.0000 mg | ORAL_CAPSULE | Freq: Three times a day (TID) | ORAL | 11 refills | Status: DC

## 2015-09-28 NOTE — Progress Notes (Signed)
Subjective:    Patient ID: Danny Lester, male    DOB: 19-Nov-1975, 40 y.o.   MRN: [id number]  HPI   New to establish.  1.  "nerve damage to hands and legs to feet starting in 2012.  Describes numbness starting in thumb side of hands.  Also describes left thumb locking at times.   Had splints at one time, which helped a bit, but the splints don't stay on any longer.  Wore then during the day and not at night. Numbness can go up to distal upper arm.  Describes a burning numbness and tingling pain. Could drop things out of left hand at times. No history of nerve conduction studies.   No history of neck pain, but then states he has bilateral shoulder pain and points to proximal traps. Later, states when his fingertips from DIP to tip would get cold and turn pale with numbness.  His cell was damp and cold much of time.  Has not had this particular symptoms except for one time at latter end of August.  May have been cold and rainy that day.  Was in solitary confinement for the year prior to symptoms starting.  Devised a sheet to use as a pull up "bar"  To perform pull ups in his cell--performed these throughout the day.  Bilateral leg symptoms started in 2012 as well.    Was released in March after 16 years confinement.  Noted pins and needles in both feet since.  Mostly in great toes on end.  Has burning from the great toes to plantar aspect of heels bilaterally.  Nighttime is the worst with this discomfort, but also when sits in a car for a prolonged period of time.  Was taking Neurontin 300 mg three times daily in prison.  Was not given any meds on release.  He thinks he received 30 caps in July when went to ED at [facility].  Another fill in August from ED visit when could not tolerate any longer.  States discomfort was significantly less when taking 3 times daily in prison, but not resolved.  Does have bilateral low back pain with radiculopathy down backs of legs. Did have xrays of neck in  prison--he thinks.  He states he was told they were fine.  2.  Psychiatric:  Loss of paternal grandmother at age 79 yo put him in a tailspin.  States diagnosed with paranoid schizophrenia, bipolar disorder with severe depression and anxiety, and PTSD States at one time he was diagnosed with multiple personality disorder, but he thinks they have removed that diagnosis.  Current Meds  Medication Sig  . divalproex (DEPAKOTE) 500 MG DR tablet Take 500 mg by mouth 2 (two) times daily.  . famotidine (PEPCID) 20 MG tablet Take 1 tablet (20 mg total) by mouth 2 (two) times daily.  Marland Kitchen gabapentin (NEURONTIN) 300 MG capsule Take 1 capsule (300 mg total) by mouth 3 (three) times daily.  . QUEtiapine (SEROQUEL) 100 MG tablet Take 100 mg by mouth at bedtime.  . traZODone (DESYREL) 50 MG tablet Take 50 mg by mouth at bedtime.   Allergies  Allergen Reactions  . Penicillins Hives and Swelling    Swelling of face Has patient had a PCN reaction causing immediate rash, facial/tongue/throat swelling, SOB or lightheadedness with hypotension: No Has patient had a PCN reaction causing severe rash involving mucus membranes or skin necrosis: Yes Has patient had a PCN reaction that required hospitalization No Has patient had a PCN reaction  occurring within the last 10 years: No If all of the above answers are "NO", then may proceed with Cephalosporin use.       Review of Systems     Objective:   Physical Exam  NAD Muscular, well developed, healthy appearing male Long dread locks HEENT: PERRL, EOMI, TMs pearly gray throat without injection, dental decay, frontal incisors Neck:  Supple, no adenopathy, no thyromegaly Chest:  CTA CV:  RRR with normal S1 and S2, No S3, S4 or murmur, radial and DP pulses normal and equal Abd:  S, NT, No HSM or mass, + BS MS: Tender over bilateral traps to nuchal ridge, Mild tenderness over Cspinous processes Tender over thoracic to L/S spinous processes and over L/S  paraspinous musculature. Ankle monitor in place Tender over plantar aspect of bilateral heels. No triggering of thumbs noted today Neuro:  A & O x 3,  CN II - XII grossly intact, DTRs 2+ /4 throughout, Motor 5/5 throughout. Sensory normal to pinprick and light touch.  Gait normal.  Positive Tinels and Phalens over median nerve at wrist bilaterally.        Assessment & Plan:  1.  Bilateral Carpal Tunnel Syndrome, thought cannot rule out cervical radiculopathy as well.  Cockup splints at bedtime daily.  Ibuprofen 600 mg twice daily with meals for now as tolerated. Would like to visualize Cspine with MRI.  Patient to apply for [facility] [employer] support and orange card and will see about getting him in for this. May also have some mild Raynaud's phenomenon associated.  No treatment for now as better now in better environment.  2.  Low back pain with likely bilateral radiculopathy:  Would like MRI here as well.  Applications as above.  Restart Gabapentin 300 mg 3 times daily.  3.  Plantar fasciitis:  Heel cups or cushions.  Stretches/exercises twice daily.  No walking in stocking or bare feet.  4.  Dental decay:  Dental referral once has orange card

## 2015-09-28 NOTE — Patient Instructions (Addendum)
Wear cock up splints at bedtime every night.   Shoe inserts for heel cushion in all shoes Stretch bottoms of feet every morning and evening--roll a can of vegetables under your bare foot while watchingTV Stay warm with layers on cool rainy days to prevent finger and toe numbness Let us know when you have orange card or [facility] financial assistance

## 2015-12-01 ENCOUNTER — Observation Stay (HOSPITAL_COMMUNITY): Payer: Medicaid Other

## 2015-12-01 ENCOUNTER — Emergency Department (HOSPITAL_COMMUNITY): Payer: Medicaid Other

## 2015-12-01 ENCOUNTER — Encounter (HOSPITAL_COMMUNITY): Payer: Self-pay | Admitting: *Deleted

## 2015-12-01 ENCOUNTER — Inpatient Hospital Stay (HOSPITAL_COMMUNITY)
Admission: EM | Admit: 2015-12-01 | Discharge: 2015-12-03 | DRG: 641 | Disposition: A | Discharge status: Home / Self Care | Payer: Medicaid Other | Attending: Internal Medicine | Admitting: Internal Medicine

## 2015-12-01 DIAGNOSIS — R945 Abnormal results of liver function studies: Secondary | ICD-10-CM | POA: Diagnosis present

## 2015-12-01 DIAGNOSIS — F419 Anxiety disorder, unspecified: Secondary | ICD-10-CM | POA: Diagnosis present

## 2015-12-01 DIAGNOSIS — R4182 Altered mental status, unspecified: Secondary | ICD-10-CM | POA: Diagnosis present

## 2015-12-01 DIAGNOSIS — Z781 Physical restraint status: Secondary | ICD-10-CM

## 2015-12-01 DIAGNOSIS — F4310 Post-traumatic stress disorder, unspecified: Secondary | ICD-10-CM | POA: Diagnosis present

## 2015-12-01 DIAGNOSIS — R740 Nonspecific elevation of levels of transaminase and lactic acid dehydrogenase [LDH]: Secondary | ICD-10-CM | POA: Diagnosis present

## 2015-12-01 DIAGNOSIS — R451 Restlessness and agitation: Secondary | ICD-10-CM

## 2015-12-01 DIAGNOSIS — E860 Dehydration: Principal | ICD-10-CM | POA: Diagnosis present

## 2015-12-01 DIAGNOSIS — Y92009 Unspecified place in unspecified non-institutional (private) residence as the place of occurrence of the external cause: Secondary | ICD-10-CM

## 2015-12-01 DIAGNOSIS — F17210 Nicotine dependence, cigarettes, uncomplicated: Secondary | ICD-10-CM | POA: Diagnosis present

## 2015-12-01 DIAGNOSIS — D72829 Elevated white blood cell count, unspecified: Secondary | ICD-10-CM | POA: Diagnosis present

## 2015-12-01 DIAGNOSIS — F4481 Dissociative identity disorder: Secondary | ICD-10-CM

## 2015-12-01 DIAGNOSIS — F200 Paranoid schizophrenia: Secondary | ICD-10-CM | POA: Diagnosis present

## 2015-12-01 DIAGNOSIS — T426X6A Underdosing of other antiepileptic and sedative-hypnotic drugs, initial encounter: Secondary | ICD-10-CM | POA: Diagnosis present

## 2015-12-01 DIAGNOSIS — N179 Acute kidney failure, unspecified: Secondary | ICD-10-CM | POA: Diagnosis present

## 2015-12-01 DIAGNOSIS — M6282 Rhabdomyolysis: Secondary | ICD-10-CM | POA: Diagnosis present

## 2015-12-01 DIAGNOSIS — R7989 Other specified abnormal findings of blood chemistry: Secondary | ICD-10-CM | POA: Diagnosis present

## 2015-12-01 DIAGNOSIS — F209 Schizophrenia, unspecified: Secondary | ICD-10-CM

## 2015-12-01 DIAGNOSIS — Z880 Allergy status to penicillin: Secondary | ICD-10-CM

## 2015-12-01 DIAGNOSIS — Z91128 Patient's intentional underdosing of medication regimen for other reason: Secondary | ICD-10-CM

## 2015-12-01 LAB — URINALYSIS, ROUTINE W REFLEX MICROSCOPIC
Bilirubin Urine: NEGATIVE
GLUCOSE, UA: 100 mg/dL — AB
LEUKOCYTES UA: NEGATIVE
NITRITE: NEGATIVE
PROTEIN: 30 mg/dL — AB
Specific Gravity, Urine: 1.025 (ref 1.005–1.030)
pH: 5.5 (ref 5.0–8.0)

## 2015-12-01 LAB — COMPREHENSIVE METABOLIC PANEL
ALK PHOS: 79 U/L (ref 38–126)
ALT: 90 U/L — ABNORMAL HIGH (ref 17–63)
ANION GAP: 18 — AB (ref 5–15)
AST: 423 U/L — ABNORMAL HIGH (ref 15–41)
Albumin: 5 g/dL (ref 3.5–5.0)
BUN: 36 mg/dL — ABNORMAL HIGH (ref 6–20)
CALCIUM: 9.4 mg/dL (ref 8.9–10.3)
CO2: 15 mmol/L — AB (ref 22–32)
Chloride: 105 mmol/L (ref 101–111)
Creatinine, Ser: 1.39 mg/dL — ABNORMAL HIGH (ref 0.61–1.24)
GFR calc non Af Amer: >60 mL/min (ref >60)
Glucose, Bld: 58 mg/dL — ABNORMAL LOW (ref 65–99)
Potassium: 4.5 mmol/L (ref 3.5–5.1)
SODIUM: 138 mmol/L (ref 135–145)
Total Bilirubin: 2.6 mg/dL — ABNORMAL HIGH (ref 0.3–1.2)
Total Protein: 8.2 g/dL — ABNORMAL HIGH (ref 6.5–8.1)

## 2015-12-01 LAB — CBC
HCT: 40.9 % (ref 39.0–52.0)
HEMOGLOBIN: 14.1 g/dL (ref 13.0–17.0)
MCH: 29.6 pg (ref 26.0–34.0)
MCHC: 34.5 g/dL (ref 30.0–36.0)
MCV: 85.9 fL (ref 78.0–100.0)
PLATELETS: 189 10*3/uL (ref 150–400)
RBC: 4.76 MIL/uL (ref 4.22–5.81)
RDW: 15.5 % (ref 11.5–15.5)
WBC: 22.3 10*3/uL — ABNORMAL HIGH (ref 4.0–10.5)

## 2015-12-01 LAB — BLOOD GAS, VENOUS
ACID-BASE DEFICIT: 9.9 mmol/L — AB (ref 0.0–2.0)
Bicarbonate: 14.9 mmol/L — ABNORMAL LOW (ref 20.0–28.0)
DRAWN BY: 295031
FIO2: 21
O2 SAT: 56.7 %
PATIENT TEMPERATURE: 98.6
PCO2 VEN: 31.1 mmHg — AB (ref 44.0–60.0)
pH, Ven: 7.303 (ref 7.250–7.430)
pO2, Ven: 33.9 mmHg (ref 32.0–45.0)

## 2015-12-01 LAB — ACETAMINOPHEN LEVEL

## 2015-12-01 LAB — RAPID URINE DRUG SCREEN, HOSP PERFORMED
Amphetamines: NOT DETECTED
Barbiturates: NOT DETECTED
Benzodiazepines: NOT DETECTED
COCAINE: NOT DETECTED
OPIATES: NOT DETECTED
TETRAHYDROCANNABINOL: POSITIVE — AB

## 2015-12-01 LAB — BASIC METABOLIC PANEL
Anion gap: 10 (ref 5–15)
BUN: 26 mg/dL — AB (ref 6–20)
CHLORIDE: 107 mmol/L (ref 101–111)
CO2: 18 mmol/L — AB (ref 22–32)
CREATININE: 1.14 mg/dL (ref 0.61–1.24)
Calcium: 8.5 mg/dL — ABNORMAL LOW (ref 8.9–10.3)
GFR calc Af Amer: >60 mL/min (ref >60)
GFR calc non Af Amer: >60 mL/min (ref >60)
GLUCOSE: 58 mg/dL — AB (ref 65–99)
Potassium: 4.9 mmol/L (ref 3.5–5.1)
Sodium: 135 mmol/L (ref 135–145)

## 2015-12-01 LAB — CBG MONITORING, ED: GLUCOSE-CAPILLARY: 132 mg/dL — AB (ref 65–99)

## 2015-12-01 LAB — SALICYLATE LEVEL

## 2015-12-01 LAB — ETHANOL: Alcohol, Ethyl (B): <5 mg/dL (ref <5)

## 2015-12-01 LAB — URINE MICROSCOPIC-ADD ON
Bacteria, UA: NONE SEEN
RBC / HPF: NONE SEEN RBC/hpf (ref 0–5)
Squamous Epithelial / LPF: NONE SEEN
WBC, UA: NONE SEEN WBC/hpf (ref 0–5)

## 2015-12-01 LAB — MAGNESIUM: Magnesium: 2.4 mg/dL (ref 1.7–2.4)

## 2015-12-01 LAB — PHOSPHORUS: PHOSPHORUS: 2.4 mg/dL — AB (ref 2.5–4.6)

## 2015-12-01 LAB — VALPROIC ACID LEVEL

## 2015-12-01 LAB — I-STAT CG4 LACTIC ACID, ED: LACTIC ACID, VENOUS: 1.13 mmol/L (ref 0.5–1.9)

## 2015-12-01 MED ORDER — SODIUM CHLORIDE 0.9 % IV BOLUS (SEPSIS)
1000.0000 mL | Freq: Once | INTRAVENOUS | Status: AC
  Administered 2015-12-01: 1000 mL via INTRAVENOUS

## 2015-12-01 MED ORDER — HALOPERIDOL LACTATE 5 MG/ML IJ SOLN
5.0000 mg | Freq: Four times a day (QID) | INTRAMUSCULAR | Status: DC | PRN
  Administered 2015-12-01 – 2015-12-02 (×2): 5 mg via INTRAVENOUS
  Filled 2015-12-01: qty 1

## 2015-12-01 MED ORDER — HALOPERIDOL LACTATE 5 MG/ML IJ SOLN
INTRAMUSCULAR | Status: AC
  Filled 2015-12-01: qty 1

## 2015-12-01 MED ORDER — DEXTROSE 50 % IV SOLN
25.0000 mL | Freq: Once | INTRAVENOUS | Status: AC
  Administered 2015-12-01: 25 mL via INTRAVENOUS
  Filled 2015-12-01: qty 50

## 2015-12-01 MED ORDER — DIPHENHYDRAMINE HCL 50 MG/ML IJ SOLN
50.0000 mg | Freq: Four times a day (QID) | INTRAMUSCULAR | Status: DC | PRN
  Administered 2015-12-01 – 2015-12-02 (×2): 50 mg via INTRAVENOUS
  Filled 2015-12-01 (×2): qty 1

## 2015-12-01 MED ORDER — LORAZEPAM 2 MG/ML IJ SOLN
1.0000 mg | Freq: Once | INTRAMUSCULAR | Status: AC
  Administered 2015-12-01: 1 mg via INTRAVENOUS
  Filled 2015-12-01: qty 1

## 2015-12-01 MED ORDER — BENZTROPINE MESYLATE 1 MG PO TABS
1.0000 mg | ORAL_TABLET | Freq: Two times a day (BID) | ORAL | Status: DC | PRN
  Administered 2015-12-02: 1 mg via ORAL
  Filled 2015-12-01 (×2): qty 1

## 2015-12-01 MED ORDER — DIPHENHYDRAMINE HCL 50 MG/ML IJ SOLN
INTRAMUSCULAR | Status: AC
  Filled 2015-12-01: qty 1

## 2015-12-01 MED ORDER — SODIUM CHLORIDE 0.9 % IV SOLN
INTRAVENOUS | Status: DC
  Administered 2015-12-01 – 2015-12-02 (×4): via INTRAVENOUS

## 2015-12-01 NOTE — ED Notes (Signed)
Called Dr Estell HarpinZammit to notify about abnormal lab values

## 2015-12-01 NOTE — ED Notes (Signed)
Patient here for agiatation and confusion. He is not oriented and talks non stop from one topic to the next.

## 2015-12-01 NOTE — ED Provider Notes (Signed)
[facility]-EMERGENCY DEPT Provider Note   CSN: [id number] Arrival date & time: 12/01/15  0808     History   Chief Complaint Chief Complaint  Patient presents with  . Agitation    HPI Danny Lester is a 40 y.o. male.  Patient brought to the emergency department by the police for confusion agitation. Patient history of schizophrenia and agitation and multiple personalities along with posttraumatic stress disease. He is supposed be taking Depakote but has not been taking it   The history is provided by the patient and medical records. No language interpreter was used.  Altered Mental Status   This is a recurrent problem. Episode onset: Unknown. The problem has not changed since onset.Associated symptoms include confusion. Pertinent negatives include no seizures and no hallucinations. Risk factors include the patient not taking medications correctly. His past medical history does not include seizures.    Past Medical History:  Diagnosis Date  . Anxiety   . Depression   . Multiple personality disorder   . Nerve pain   . PTSD (post-traumatic stress disorder)   . Schizophrenia, paranoid, chronic [facility](HCC)     Patient Active Problem List   Diagnosis Date Noted  . Dehydration 12/01/2015  . Carpal tunnel syndrome, bilateral 09/28/2015  . Neck pain 09/28/2015  . Low back pain 09/28/2015  . Plantar fasciitis, bilateral 09/28/2015  . Dental decay 09/28/2015    Past Surgical History:  Procedure Laterality Date  . KNEE SURGERY         Home Medications    Prior to Admission medications   Medication Sig Start Date End Date Taking? Authorizing Provider  divalproex (DEPAKOTE) 500 MG DR tablet Take 500 mg by mouth 2 (two) times daily.   Yes Historical Provider, MD  benztropine (COGENTIN) 1 MG tablet Take 1 tablet (1 mg total) by mouth 2 (two) times daily as needed for tremors. Patient not taking: Reported on 12/01/2015 09/03/15   Lorre NickAnthony Allen, MD  famotidine (PEPCID) 20 MG tablet Take  1 tablet (20 mg total) by mouth 2 (two) times daily. Patient not taking: Reported on 12/01/2015 07/18/15   Trixie DredgeEmily West, PA-C  gabapentin (NEURONTIN) 300 MG capsule Take 1 capsule (300 mg total) by mouth 3 (three) times daily. Patient not taking: Reported on 12/01/2015 09/28/15   Julieanne MansonElizabeth Mulberry, MD  ibuprofen (ADVIL,MOTRIN) 200 MG tablet 3 tabs by mouth twice daily with food Patient not taking: Reported on 12/01/2015 09/28/15   Julieanne MansonElizabeth Mulberry, MD    Family History No family history on file.  Social History Social History  Substance Use Topics  . Smoking status: Current Some Day Smoker    Packs/day: 0.10    Types: Cigarettes  . Smokeless tobacco: Never Used  . Alcohol use Yes     Allergies   Penicillins   Review of Systems Review of Systems  Constitutional: Negative for appetite change and fatigue.  HENT: Negative for congestion, ear discharge and sinus pressure.   Eyes: Negative for discharge.  Respiratory: Negative for cough.   Cardiovascular: Negative for chest pain.  Gastrointestinal: Negative for abdominal pain and diarrhea.  Genitourinary: Negative for frequency and hematuria.  Musculoskeletal: Negative for back pain.  Skin: Negative for rash.  Neurological: Negative for seizures and headaches.  Psychiatric/Behavioral: Positive for confusion. Negative for hallucinations.     Physical Exam Updated Vital Signs BP 136/74   Pulse (!) 54   Temp 97.8 F (36.6 C)   Resp 20   SpO2 99%   Physical Exam  Constitutional: He  appears well-developed.  HENT:  Head: Normocephalic.  Eyes: Conjunctivae and EOM are normal. No scleral icterus.  Neck: Neck supple. No thyromegaly present.  Cardiovascular: Normal rate and regular rhythm.  Exam reveals no gallop and no friction rub.   No murmur heard. Pulmonary/Chest: No stridor. He has no wheezes. He has no rales. He exhibits no tenderness.  Abdominal: He exhibits no distension. There is no tenderness. There is no rebound.    Musculoskeletal: Normal range of motion. He exhibits no edema.  Lymphadenopathy:    He has no cervical adenopathy.  Neurological: He is alert. He exhibits normal muscle tone. Coordination normal.  Patient very agitated. He is alert he is oriented to person only. He does not eat in the hospital. He is continuously talking. Most the time he's not making any sense.  Skin: No rash noted. No erythema.  Psychiatric: He has a normal mood and affect. His behavior is normal.     ED Treatments / Results  Labs (all labs ordered are listed, but only abnormal results are displayed) Labs Reviewed  COMPREHENSIVE METABOLIC PANEL - Abnormal; Notable for the following:       Result Value   CO2 15 (*)    Glucose, Bld 58 (*)    BUN 36 (*)    Creatinine, Ser 1.39 (*)    Total Protein 8.2 (*)    AST 423 (*)    ALT 90 (*)    Total Bilirubin 2.6 (*)    Anion gap 18 (*)    All other components within normal limits  ACETAMINOPHEN LEVEL - Abnormal; Notable for the following:    Acetaminophen (Tylenol), Serum <10 (*)    All other components within normal limits  CBC - Abnormal; Notable for the following:    WBC 22.3 (*)    All other components within normal limits  RAPID URINE DRUG SCREEN, HOSP PERFORMED - Abnormal; Notable for the following:    Tetrahydrocannabinol POSITIVE (*)    All other components within normal limits  URINALYSIS, ROUTINE W REFLEX MICROSCOPIC (NOT AT [facility]) - Abnormal; Notable for the following:    APPearance CLOUDY (*)    Glucose, UA 100 (*)    Hgb urine dipstick LARGE (*)    Ketones, ur >80 (*)    Protein, ur 30 (*)    All other components within normal limits  VALPROIC ACID LEVEL - Abnormal; Notable for the following:    Valproic Acid Lvl <10 (*)    All other components within normal limits  URINE MICROSCOPIC-ADD ON - Abnormal; Notable for the following:    Casts HYALINE CASTS (*)    All other components within normal limits  CBG MONITORING, ED - Abnormal; Notable for  the following:    Glucose-Capillary 132 (*)    All other components within normal limits  CULTURE, BLOOD (ROUTINE X 2)  CULTURE, BLOOD (ROUTINE X 2)  ETHANOL  SALICYLATE LEVEL  BLOOD GAS, VENOUS  I-STAT CG4 LACTIC ACID, ED    EKG  EKG Interpretation None       Radiology Dg Chest Port 1 View  Result Date: 12/01/2015 CLINICAL DATA:  Agitation, confusion EXAM: PORTABLE CHEST 1 VIEW COMPARISON:  Chest x-ray of 07/18/2015 FINDINGS: Minimal haziness at the lung bases most likely represents linear atelectasis left-greater-than-right. No definite pneumonia or effusion is seen. The heart is borderline enlarged. No bony abnormality is seen. IMPRESSION: Mild bibasilar atelectasis left-greater-than-right. Electronically Signed   By: Dwyane DeePaul  Barry M.D.   On: 12/01/2015 10:59  Procedures Procedures (including critical care time)  Medications Ordered in ED Medications  sodium chloride 0.9 % bolus 1,000 mL (1,000 mLs Intravenous New Bag/Given 12/01/15 1112)  dextrose 50 % solution 25 mL (25 mLs Intravenous Given 12/01/15 1121)     Initial Impression / Assessment and Plan / ED Course  I have reviewed the triage vital signs and the nursing notes.  Pertinent labs & imaging results that were available during my care of the patient were reviewed by me and considered in my medical decision making (see chart for details).  Clinical Course     Patient with agitation confusion. Suspect he is having an exacerbation of his paranoid schizophrenia. He is also dehydrated. Patient will be admitted to medicine for further workup and psychiatry will be eventually consulted  Final Clinical Impressions(s) / ED Diagnoses   Final diagnoses:  Agitation    New Prescriptions New Prescriptions   No medications on file     Bethann Berkshire, MD 12/01/15 1313

## 2015-12-01 NOTE — ED Notes (Signed)
Bed: WA27 Expected date:  Expected time:  Means of arrival:  Comments: Clean and ready for jen

## 2015-12-01 NOTE — ED Notes (Signed)
Bed: WA09 Expected date:  Expected time:  Means of arrival:  Comments: Hold for rm 27

## 2015-12-01 NOTE — ED Triage Notes (Addendum)
[facility] [employer] [facility] to [facility] himself [facility], but their staff stated "he was on drugs" and they couldn't accept him. Pt appears agitated, obviously responding to internal stimuli, keeps talking about his wife "she's trying to do me in, I didn't do those things she says I did. She keeps telling y'all I was hurting her, but I didn't she did that to herself".(Pt's gf was seen here yesterday for SI-wrist lacerations and pt was with her)

## 2015-12-01 NOTE — ED Notes (Signed)
Patient transferred to floor.

## 2015-12-01 NOTE — ED Notes (Signed)
Patient's belongings moved to 5 east and given to a tech to be secured.

## 2015-12-01 NOTE — ED Notes (Signed)
Report given to floor Rn. Patient is stable laying in bed talking.

## 2015-12-01 NOTE — ED Notes (Signed)
Pt given ham sandwich and orange juice. 

## 2015-12-01 NOTE — Progress Notes (Signed)
RN received report from ED. Pt arrived unit from ED, yelling and screaming. Pt very erratic in behavior, attempting to hit staff. MD notified, Psch. MD called for assistance. Meds. ordered and Pt placed on restraint. Sitter at bed side. Will continue with current plan of care.

## 2015-12-01 NOTE — ED Notes (Addendum)
Personal Belongings: Blue long sleeve shirt, jeans, blk belt, [place] white & blue shoes, white v-neck shirt, white long socks, Earrings

## 2015-12-01 NOTE — Progress Notes (Signed)
Pt with sitter at doorway Uninsured pt with pcp listed as Julieanne MansonElizabeth Mulberry  CM spoke with pt who confirmed E Mulberry as pcp Pt states upcoming visit to pcp on December 28 2015  Pt throughout conversation noted to repeat statements over in series of threes.  "Yes. Yes. Yes." December third. December third.  December third."

## 2015-12-01 NOTE — H&P (Signed)
History and Physical    Melville Kaneshiro OZH:[id number] DOB: 18-Aug-1975 DOA: 12/01/2015  PCP: Julieanne Manson, MD  Patient coming from: home  Chief Complaint: altered mental status  HPI: Ramin Sanjuan is a 40 y.o. male with medical history significant of schizophrenia, multiple personality disorder, anxiety presenting to the ED after being found confused in the community. The patient is unable to provide any history and has tangential thinking. Much of the history is obtained by discussion with the ER the physician and EMR.  ED Course: Patient was found to be altered mentally. With elevated BUN creatinine as well as AST and ALT levels. Also found to have concentrated urine with large ketones. We are consulted for further evaluation and recommendations.  Review of Systems: As per HPI otherwise 10 point review of systems negative.   Past Medical History:  Diagnosis Date  . Anxiety   . Depression   . Multiple personality disorder   . Nerve pain   . PTSD (post-traumatic stress disorder)   . Schizophrenia, paranoid, chronic (HCC)     Past Surgical History:  Procedure Laterality Date  . KNEE SURGERY       reports that he has been smoking Cigarettes.  He has been smoking about 0.10 packs per day. He has never used smokeless tobacco. He reports that he drinks alcohol. He reports that he does not use drugs.  Allergies  Allergen Reactions  . Penicillins Hives and Swelling    Swelling of face Has patient had a PCN reaction causing immediate rash, facial/tongue/throat swelling, SOB or lightheadedness with hypotension: No Has patient had a PCN reaction causing severe rash involving mucus membranes or skin necrosis: Yes Has patient had a PCN reaction that required hospitalization No Has patient had a PCN reaction occurring within the last 10 years: No If all of the above answers are "NO", then may proceed with Cephalosporin use.     No family history on file.   Prior to Admission  medications   Medication Sig Start Date End Date Taking? Authorizing Provider  divalproex (DEPAKOTE) 500 MG DR tablet Take 500 mg by mouth 2 (two) times daily.   Yes Historical Provider, MD  benztropine (COGENTIN) 1 MG tablet Take 1 tablet (1 mg total) by mouth 2 (two) times daily as needed for tremors. Patient not taking: Reported on 12/01/2015 09/03/15   Lorre Nick, MD  famotidine (PEPCID) 20 MG tablet Take 1 tablet (20 mg total) by mouth 2 (two) times daily. Patient not taking: Reported on 12/01/2015 07/18/15   Trixie Dredge, PA-C  gabapentin (NEURONTIN) 300 MG capsule Take 1 capsule (300 mg total) by mouth 3 (three) times daily. Patient not taking: Reported on 12/01/2015 09/28/15   Julieanne Manson, MD  ibuprofen (ADVIL,MOTRIN) 200 MG tablet 3 tabs by mouth twice daily with food Patient not taking: Reported on 12/01/2015 09/28/15   Julieanne Manson, MD    Physical Exam: Vitals:   12/01/15 0820  BP: 136/74  Pulse: (!) 54  Resp: 20  Temp: 97.8 F (36.6 C)  SpO2: 99%      Constitutional: NAD, calm, comfortable Vitals:   12/01/15 0820  BP: 136/74  Pulse: (!) 54  Resp: 20  Temp: 97.8 F (36.6 C)  SpO2: 99%   Eyes: PERRL, lids and conjunctivae normal ENMT: Mucous membranes are dry.  Normal dentition.  Neck: normal, supple, no masses, no thyromegaly Respiratory: clear to auscultation bilaterally, no wheezing, no crackles. Normal respiratory effort. No accessory muscle use.  Cardiovascular: Regular rate and rhythm, no  murmurs / rubs / gallops. No extremity edema.  Abdomen: no tenderness, no masses palpated. No guarding Bowel sounds positive.  Musculoskeletal: no clubbing / cyanosis. No joint deformity upper and lower extremities.  Skin: no rashes, lesions, ulcers. No induration on limited exam. Neurologic: unable to participate well due to uncontrolled psychiatric condition, no facial asymmetry, moves extremities equally Psychiatric: tangential thinking, unable to assess fully  due to uncontrolled pschychiatric condition  Labs on Admission: I have personally reviewed following labs and imaging studies  CBC:  Recent Labs Lab 12/01/15 0841  WBC 22.3*  HGB 14.1  HCT 40.9  MCV 85.9  PLT 189   Basic Metabolic Panel:  Recent Labs Lab 12/01/15 0841  NA 138  K 4.5  CL 105  CO2 15*  GLUCOSE 58*  BUN 36*  CREATININE 1.39*  CALCIUM 9.4   GFR: CrCl cannot be calculated (Unknown ideal weight.). Liver Function Tests:  Recent Labs Lab 12/01/15 0841  AST 423*  ALT 90*  ALKPHOS 79  BILITOT 2.6*  PROT 8.2*  ALBUMIN 5.0   No results for input(s): LIPASE, AMYLASE in the last 168 hours. No results for input(s): AMMONIA in the last 168 hours. Coagulation Profile: No results for input(s): INR, PROTIME in the last 168 hours. Cardiac Enzymes: No results for input(s): CKTOTAL, CKMB, CKMBINDEX, TROPONINI in the last 168 hours. BNP (last 3 results) No results for input(s): PROBNP in the last 8760 hours. HbA1C: No results for input(s): HGBA1C in the last 72 hours. CBG:  Recent Labs Lab 12/01/15 1153  GLUCAP 132*   Lipid Profile: No results for input(s): CHOL, HDL, LDLCALC, TRIG, CHOLHDL, LDLDIRECT in the last 72 hours. Thyroid Function Tests: No results for input(s): TSH, T4TOTAL, FREET4, T3FREE, THYROIDAB in the last 72 hours. Anemia Panel: No results for input(s): VITAMINB12, FOLATE, FERRITIN, TIBC, IRON, RETICCTPCT in the last 72 hours. Urine analysis:    Component Value Date/Time   COLORURINE YELLOW 12/01/2015 1202   APPEARANCEUR CLOUDY (A) 12/01/2015 1202   LABSPEC 1.025 12/01/2015 1202   PHURINE 5.5 12/01/2015 1202   GLUCOSEU 100 (A) 12/01/2015 1202   HGBUR LARGE (A) 12/01/2015 1202   BILIRUBINUR NEGATIVE 12/01/2015 1202   KETONESUR >80 (A) 12/01/2015 1202   PROTEINUR 30 (A) 12/01/2015 1202   NITRITE NEGATIVE 12/01/2015 1202   LEUKOCYTESUR NEGATIVE 12/01/2015 1202   Sepsis Labs:  !!!!!!!!!!!!!!!!!!!!!!!!!!!!!!!!!!!!!!!!!!!! @LABRCNTIP (procalcitonin:4,lacticidven:4) )No results found for this or any previous visit (from the past 240 hour(s)).   Radiological Exams on Admission: Dg Chest Port 1 View  Result Date: 12/01/2015 CLINICAL DATA:  Agitation, confusion EXAM: PORTABLE CHEST 1 VIEW COMPARISON:  Chest x-ray of 07/18/2015 FINDINGS: Minimal haziness at the lung bases most likely represents linear atelectasis left-greater-than-right. No definite pneumonia or effusion is seen. The heart is borderline enlarged. No bony abnormality is seen. IMPRESSION: Mild bibasilar atelectasis left-greater-than-right. Electronically Signed   By: Dwyane DeePaul  Barry M.D.   On: 12/01/2015 10:59    EKG: Independently reviewed. Sinus rhythm with no ST elevations or depressions  Assessment/Plan Active Problems:   Dehydration -  Rehydrate and reassess bmp  Transaminitis -  CMP tomorrow morning.    Leukocytosis - No source of infection identified - will monitor off antibiotics - no fevers    Schizophrenia (HCC) - not well controlled, unable to place back on depakote due to elevated AST of 423 and depakotes contraindication in folks with hepatic disease as well as potential for side effect of hepatotoxicity.  Will consult psychiatry to see if there is another  medication that can be used for treatment of schizophrenia    Multiple personality disorder - psych consult   DVT prophylaxis: heparin Code Status: full Family Communication:  Disposition Plan:  Consults called: psychiatry  Admission status: obs   Penny PiaVEGA, Ladeidra Borys MD [facility] Pager [phone]  If 7PM-7AM, please contact night-coverage [email or web address] Password [email or web address]  12/01/2015, 1:28 PM

## 2015-12-02 ENCOUNTER — Observation Stay (HOSPITAL_COMMUNITY): Payer: Medicaid Other

## 2015-12-02 DIAGNOSIS — R4182 Altered mental status, unspecified: Secondary | ICD-10-CM | POA: Diagnosis present

## 2015-12-02 DIAGNOSIS — F4310 Post-traumatic stress disorder, unspecified: Secondary | ICD-10-CM | POA: Diagnosis present

## 2015-12-02 DIAGNOSIS — M6282 Rhabdomyolysis: Secondary | ICD-10-CM | POA: Diagnosis present

## 2015-12-02 DIAGNOSIS — Z91128 Patient's intentional underdosing of medication regimen for other reason: Secondary | ICD-10-CM | POA: Diagnosis not present

## 2015-12-02 DIAGNOSIS — Z781 Physical restraint status: Secondary | ICD-10-CM | POA: Diagnosis not present

## 2015-12-02 DIAGNOSIS — D72829 Elevated white blood cell count, unspecified: Secondary | ICD-10-CM

## 2015-12-02 DIAGNOSIS — R7989 Other specified abnormal findings of blood chemistry: Secondary | ICD-10-CM

## 2015-12-02 DIAGNOSIS — Z818 Family history of other mental and behavioral disorders: Secondary | ICD-10-CM

## 2015-12-02 DIAGNOSIS — Y92009 Unspecified place in unspecified non-institutional (private) residence as the place of occurrence of the external cause: Secondary | ICD-10-CM | POA: Diagnosis not present

## 2015-12-02 DIAGNOSIS — F419 Anxiety disorder, unspecified: Secondary | ICD-10-CM | POA: Diagnosis present

## 2015-12-02 DIAGNOSIS — F17210 Nicotine dependence, cigarettes, uncomplicated: Secondary | ICD-10-CM

## 2015-12-02 DIAGNOSIS — E860 Dehydration: Secondary | ICD-10-CM | POA: Diagnosis not present

## 2015-12-02 DIAGNOSIS — F200 Paranoid schizophrenia: Secondary | ICD-10-CM | POA: Diagnosis present

## 2015-12-02 DIAGNOSIS — Z880 Allergy status to penicillin: Secondary | ICD-10-CM

## 2015-12-02 DIAGNOSIS — R451 Restlessness and agitation: Secondary | ICD-10-CM | POA: Diagnosis present

## 2015-12-02 DIAGNOSIS — F4481 Dissociative identity disorder: Secondary | ICD-10-CM | POA: Diagnosis present

## 2015-12-02 DIAGNOSIS — N179 Acute kidney failure, unspecified: Secondary | ICD-10-CM | POA: Diagnosis present

## 2015-12-02 DIAGNOSIS — F1210 Cannabis abuse, uncomplicated: Secondary | ICD-10-CM

## 2015-12-02 DIAGNOSIS — R740 Nonspecific elevation of levels of transaminase and lactic acid dehydrogenase [LDH]: Secondary | ICD-10-CM | POA: Diagnosis present

## 2015-12-02 DIAGNOSIS — T426X6A Underdosing of other antiepileptic and sedative-hypnotic drugs, initial encounter: Secondary | ICD-10-CM | POA: Diagnosis present

## 2015-12-02 DIAGNOSIS — Z813 Family history of other psychoactive substance abuse and dependence: Secondary | ICD-10-CM

## 2015-12-02 DIAGNOSIS — Z79899 Other long term (current) drug therapy: Secondary | ICD-10-CM

## 2015-12-02 DIAGNOSIS — R45851 Suicidal ideations: Secondary | ICD-10-CM

## 2015-12-02 DIAGNOSIS — R945 Abnormal results of liver function studies: Secondary | ICD-10-CM | POA: Diagnosis present

## 2015-12-02 DIAGNOSIS — Z9114 Patient's other noncompliance with medication regimen: Secondary | ICD-10-CM

## 2015-12-02 LAB — BASIC METABOLIC PANEL
Anion gap: 5 (ref 5–15)
Anion gap: 6 (ref 5–15)
BUN: 20 mg/dL (ref 6–20)
BUN: 17 mg/dL (ref 6–20)
CO2: 20 mmol/L — ABNORMAL LOW (ref 22–32)
CO2: 22 mmol/L (ref 22–32)
CREATININE: 0.89 mg/dL (ref 0.61–1.24)
CREATININE: 0.76 mg/dL (ref 0.61–1.24)
Calcium: 8 mg/dL — ABNORMAL LOW (ref 8.9–10.3)
Calcium: 8 mg/dL — ABNORMAL LOW (ref 8.9–10.3)
Chloride: 110 mmol/L (ref 101–111)
Chloride: 109 mmol/L (ref 101–111)
Glucose, Bld: 116 mg/dL — ABNORMAL HIGH (ref 65–99)
Glucose, Bld: 102 mg/dL — ABNORMAL HIGH (ref 65–99)
POTASSIUM: 3.8 mmol/L (ref 3.5–5.1)
POTASSIUM: 3.5 mmol/L (ref 3.5–5.1)
SODIUM: 135 mmol/L (ref 135–145)
SODIUM: 137 mmol/L (ref 135–145)

## 2015-12-02 LAB — CBC
HCT: 36.5 % — ABNORMAL LOW (ref 39.0–52.0)
HEMOGLOBIN: 12.4 g/dL — AB (ref 13.0–17.0)
MCH: 29.1 pg (ref 26.0–34.0)
MCHC: 34 g/dL (ref 30.0–36.0)
MCV: 85.7 fL (ref 78.0–100.0)
PLATELETS: 153 10*3/uL (ref 150–400)
RBC: 4.26 MIL/uL (ref 4.22–5.81)
RDW: 15.4 % (ref 11.5–15.5)
WBC: 17.7 10*3/uL — ABNORMAL HIGH (ref 4.0–10.5)

## 2015-12-02 LAB — COMPREHENSIVE METABOLIC PANEL
ALK PHOS: 52 U/L (ref 38–126)
ALT: 122 U/L — AB (ref 17–63)
ANION GAP: 8 (ref 5–15)
AST: 492 U/L — ABNORMAL HIGH (ref 15–41)
Albumin: 3.4 g/dL — ABNORMAL LOW (ref 3.5–5.0)
BILIRUBIN TOTAL: 2.1 mg/dL — AB (ref 0.3–1.2)
BUN: 27 mg/dL — ABNORMAL HIGH (ref 6–20)
CALCIUM: 8.1 mg/dL — AB (ref 8.9–10.3)
CO2: 18 mmol/L — AB (ref 22–32)
CREATININE: 1.1 mg/dL (ref 0.61–1.24)
Chloride: 110 mmol/L (ref 101–111)
Glucose, Bld: 86 mg/dL (ref 65–99)
Potassium: 4 mmol/L (ref 3.5–5.1)
Sodium: 136 mmol/L (ref 135–145)
TOTAL PROTEIN: 6.3 g/dL — AB (ref 6.5–8.1)

## 2015-12-02 LAB — AMMONIA: AMMONIA: 32 umol/L (ref 9–35)

## 2015-12-02 LAB — PROTIME-INR
INR: 1.05
PROTHROMBIN TIME: 13.7 s (ref 11.4–15.2)

## 2015-12-02 LAB — TSH: TSH: 0.412 u[IU]/mL (ref 0.350–4.500)

## 2015-12-02 LAB — CK: Total CK: 18941 U/L — ABNORMAL HIGH (ref 49–397)

## 2015-12-02 MED ORDER — DIVALPROEX SODIUM 250 MG PO DR TAB
500.0000 mg | DELAYED_RELEASE_TABLET | Freq: Two times a day (BID) | ORAL | Status: DC
  Administered 2015-12-02: 500 mg via ORAL
  Filled 2015-12-02: qty 2

## 2015-12-02 MED ORDER — SODIUM BICARBONATE 650 MG PO TABS
650.0000 mg | ORAL_TABLET | Freq: Two times a day (BID) | ORAL | Status: DC
  Administered 2015-12-02 (×2): 650 mg via ORAL
  Filled 2015-12-02 (×2): qty 1

## 2015-12-02 MED ORDER — TRAZODONE HCL 100 MG PO TABS
100.0000 mg | ORAL_TABLET | Freq: Every day | ORAL | Status: DC
  Administered 2015-12-02: 100 mg via ORAL
  Filled 2015-12-02: qty 1

## 2015-12-02 NOTE — Consult Note (Signed)
[place] [facility] Consult   Reason for Consult: Altered mental status and agitation Referring Physician:  Dr. Erlinda Hong Patient Identification: Danny Lester MRN:  [id number] Principal Diagnosis: <principal problem not specified> Diagnosis:   Patient Active Problem List   Diagnosis Date Noted  . Dehydration [E86.0] 12/01/2015  . Leukocytosis [D72.829] 12/01/2015  . Schizophrenia ([place]) [F20.9] 12/01/2015  . 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 Carpal tunnel syndrome, bilateral [G56.03] 09/28/2015  . Neck pain [M54.2] 09/28/2015  . Low back pain [M54.5] 09/28/2015  . Plantar fasciitis, bilateral [M72.2] 09/28/2015  . Dental decay [K02.9] 09/28/2015    Total Time spent with patient: 1 hour  Subjective:   Danny Lester is a 40 y.o. male patient admitted with agitation and altered mental status.  HPI:  Danny Lester  is a 40 years old male seen, chart reviewed for the face-to-face psychiatric consultation and evaluation of increased psychosis, agitation and history of schizophrenia with recent noncompliant with medication for the last 10 days. Patient reported he has been abusing drugs like alcohol 3-4 beers today, smoking weed with the latest stimulants like cocaine. Patient stated he stopped taking medication because he wanted to overcome his problems with her medication and then started self-medicating with a drug of abuse. Patient stated he came to the hospital voluntarily to get back on his medication management. Patient was found confused in the community and unable to provide linear and goal-directed history because of tangential thinking and also has poor insight. Patient denies current suicidal/homicidal ideation, intention or plans. Patient has no evidence of psychosis and not responding to internal stimuli. Patient is willing to participate in medication management and stated he has been taken and Depakote trazodone, Lurasidone and 2 different anti-depressant  medications from [facility].    Past Psychiatric History: Patient has been diagnosed with schizophrenia and polysubstance abuse but patient denied previous history of acute psychiatric hospitalization.   Agitation and altered mental status Risk to Self: Is patient at risk for suicide?: No, but patient needs Medical Clearance Risk to Others:   Prior Inpatient Therapy:   Prior Outpatient Therapy:    Past Medical History:  Past Medical History:  Diagnosis Date  . Anxiety   . Depression   . Multiple personality disorder   . Nerve pain   . PTSD (post-traumatic stress disorder)   . Schizophrenia, paranoid, chronic ([place])     Past Surgical History:  Procedure Laterality Date  . KNEE SURGERY     Family History: No family history on file. Family Psychiatric  History: Patient reported family history of substance abuse and bipolar disorder most significant.  Social History:  History  Alcohol Use  . Yes     History  Drug Use No    Social History   Social History  . Marital status: Single    Spouse name: N/A  . Number of children: N/A  . Years of education: N/A   Social History Main Topics  . Smoking status: Current Some Day Smoker    Packs/day: 0.10    Types: Cigarettes  . Smokeless tobacco: Never Used  . Alcohol use Yes  . Drug use: No  . Sexual activity: Yes    Birth control/ protection: None   Other Topics Concern  . None   Social History Narrative  . None   Additional Social History:    Allergies:   Allergies  Allergen Reactions  . Penicillins Hives and Swelling    Swelling of face Has patient had a PCN reaction causing  immediate rash, facial/tongue/throat swelling, SOB or lightheadedness with hypotension: No Has patient had a PCN reaction causing severe rash involving mucus membranes or skin necrosis: Yes Has patient had a PCN reaction that required hospitalization No Has patient had a PCN reaction occurring within the last 10 years: No If all of the above  answers are "NO", then may proceed with Cephalosporin use.     Labs:  Results for orders placed or performed during the hospital encounter of 12/01/15 (from the past 48 hour(s))  Comprehensive metabolic panel     Status: Abnormal   Collection Time: 12/01/15  8:41 AM  Result Value Ref Range   Sodium 138 135 - 145 mmol/L   Potassium 4.5 3.5 - 5.1 mmol/L   Chloride 105 101 - 111 mmol/L   CO2 15 (L) 22 - 32 mmol/L   Glucose, Bld 58 (L) 65 - 99 mg/dL   BUN 36 (H) 6 - 20 mg/dL   Creatinine, Ser 1.39 (H) 0.61 - 1.24 mg/dL   Calcium 9.4 8.9 - 10.3 mg/dL   Total Protein 8.2 (H) 6.5 - 8.1 g/dL   Albumin 5.0 3.5 - 5.0 g/dL   AST 423 (H) 15 - 41 U/L   ALT 90 (H) 17 - 63 U/L   Alkaline Phosphatase 79 38 - 126 U/L   Total Bilirubin 2.6 (H) 0.3 - 1.2 mg/dL   GFR calc non Af Amer >60 >60 mL/min   GFR calc Af Amer >60 >60 mL/min    Comment: (NOTE) The eGFR has been calculated using the CKD EPI equation. This calculation has not been validated in all clinical situations. eGFR's persistently <60 mL/min signify possible Chronic Kidney Disease.    Anion gap 18 (H) 5 - 15  Ethanol     Status: None   Collection Time: 12/01/15  8:41 AM  Result Value Ref Range   Alcohol, Ethyl (B) <5 <5 mg/dL    Comment:        LOWEST DETECTABLE LIMIT FOR SERUM ALCOHOL IS 5 mg/dL FOR MEDICAL PURPOSES ONLY   Salicylate level     Status: None   Collection Time: 12/01/15  8:41 AM  Result Value Ref Range   Salicylate Lvl <6.0 2.8 - 30.0 mg/dL  Acetaminophen level     Status: Abnormal   Collection Time: 12/01/15  8:41 AM  Result Value Ref Range   Acetaminophen (Tylenol), Serum <10 (L) 10 - 30 ug/mL    Comment:        THERAPEUTIC CONCENTRATIONS VARY SIGNIFICANTLY. A RANGE OF 10-30 ug/mL MAY BE AN EFFECTIVE CONCENTRATION FOR MANY PATIENTS. HOWEVER, SOME ARE BEST TREATED AT CONCENTRATIONS OUTSIDE THIS RANGE. ACETAMINOPHEN CONCENTRATIONS >150 ug/mL AT 4 HOURS AFTER INGESTION AND >50 ug/mL AT 12 HOURS AFTER  INGESTION ARE OFTEN ASSOCIATED WITH TOXIC REACTIONS.   cbc     Status: Abnormal   Collection Time: 12/01/15  8:41 AM  Result Value Ref Range   WBC 22.3 (H) 4.0 - 10.5 K/uL   RBC 4.76 4.22 - 5.81 MIL/uL   Hemoglobin 14.1 13.0 - 17.0 g/dL   HCT 40.9 39.0 - 52.0 %   MCV 85.9 78.0 - 100.0 fL   MCH 29.6 26.0 - 34.0 pg   MCHC 34.5 30.0 - 36.0 g/dL   RDW 15.5 11.5 - 15.5 %   Platelets 189 150 - 400 K/uL  Blood culture (routine x 2)     Status: None (Preliminary result)   Collection Time: 12/01/15 10:49 AM  Result Value Ref Range  Specimen Description BLOOD LEFT ANTECUBITAL    Special Requests BOTTLES DRAWN AEROBIC AND ANAEROBIC 5CC    Culture      NO GROWTH 1 DAY Performed at [facility]    Report Status PENDING   Valproic acid level     Status: Abnormal   Collection Time: 12/01/15 10:58 AM  Result Value Ref Range   Valproic Acid Lvl <10 (L) 50.0 - 100.0 ug/mL    Comment: RESULTS CONFIRMED BY MANUAL DILUTION  Blood culture (routine x 2)     Status: None (Preliminary result)   Collection Time: 12/01/15 11:11 AM  Result Value Ref Range   Specimen Description BLOOD RIGHT ANTECUBITAL    Special Requests BOTTLES DRAWN AEROBIC AND ANAEROBIC 5CC    Culture      NO GROWTH 1 DAY Performed at [facility]    Report Status PENDING   CBG monitoring, ED     Status: Abnormal   Collection Time: 12/01/15 11:53 AM  Result Value Ref Range   Glucose-Capillary 132 (H) 65 - 99 mg/dL   Comment 1 Notify RN    Comment 2 Document in Chart   Rapid urine drug screen (hospital performed)     Status: Abnormal   Collection Time: 12/01/15 12:02 PM  Result Value Ref Range   Opiates NONE DETECTED NONE DETECTED   Cocaine NONE DETECTED NONE DETECTED   Benzodiazepines NONE DETECTED NONE DETECTED   Amphetamines NONE DETECTED NONE DETECTED   Tetrahydrocannabinol POSITIVE (A) NONE DETECTED   Barbiturates NONE DETECTED NONE DETECTED    Comment:        DRUG SCREEN FOR MEDICAL PURPOSES ONLY.   IF CONFIRMATION IS NEEDED FOR ANY PURPOSE, NOTIFY LAB WITHIN 5 DAYS.        LOWEST DETECTABLE LIMITS FOR URINE DRUG SCREEN Drug Class       Cutoff (ng/mL) Amphetamine      1000 Barbiturate      200 Benzodiazepine   517 Tricyclics       616 Opiates          300 Cocaine          300 THC              50   Urinalysis, Routine w reflex microscopic (not at [facility])     Status: Abnormal   Collection Time: 12/01/15 12:02 PM  Result Value Ref Range   Color, Urine YELLOW YELLOW   APPearance CLOUDY (A) CLEAR   Specific Gravity, Urine 1.025 1.005 - 1.030   pH 5.5 5.0 - 8.0   Glucose, UA 100 (A) NEGATIVE mg/dL   Hgb urine dipstick LARGE (A) NEGATIVE   Bilirubin Urine NEGATIVE NEGATIVE   Ketones, ur >80 (A) NEGATIVE mg/dL   Protein, ur 30 (A) NEGATIVE mg/dL   Nitrite NEGATIVE NEGATIVE   Leukocytes, UA NEGATIVE NEGATIVE  Urine microscopic-add on     Status: Abnormal   Collection Time: 12/01/15 12:02 PM  Result Value Ref Range   Squamous Epithelial / LPF NONE SEEN NONE SEEN   WBC, UA NONE SEEN 0 - 5 WBC/hpf   RBC / HPF NONE SEEN 0 - 5 RBC/hpf   Bacteria, UA NONE SEEN NONE SEEN   Casts HYALINE CASTS (A) NEGATIVE   Urine-Other AMORPHOUS URATES/PHOSPHATES   Blood gas, venous     Status: Abnormal   Collection Time: 12/01/15  1:00 PM  Result Value Ref Range   FIO2 21.00    Delivery systems ROOM AIR    pH,  Ven 7.303 7.250 - 7.430   pCO2, Ven 31.1 (L) 44.0 - 60.0 mmHg   pO2, Ven 33.9 32.0 - 45.0 mmHg   Bicarbonate 14.9 (L) 20.0 - 28.0 mmol/L   Acid-base deficit 9.9 (H) 0.0 - 2.0 mmol/L   O2 Saturation 56.7 %   Patient temperature 98.6    Collection site VEIN    Drawn by [phone]    Sample type VEINOUS   I-Stat CG4 Lactic Acid, ED     Status: None   Collection Time: 12/01/15  1:17 PM  Result Value Ref Range   Lactic Acid, Venous 1.13 0.5 - 1.9 mmol/L  Phosphorus     Status: Abnormal   Collection Time: 12/01/15  6:16 PM  Result Value Ref Range   Phosphorus 2.4 (L) 2.5 - 4.6 mg/dL   Magnesium     Status: None   Collection Time: 12/01/15  6:16 PM  Result Value Ref Range   Magnesium 2.4 1.7 - 2.4 mg/dL  Basic metabolic panel     Status: Abnormal   Collection Time: 12/01/15  6:16 PM  Result Value Ref Range   Sodium 135 135 - 145 mmol/L   Potassium 4.9 3.5 - 5.1 mmol/L   Chloride 107 101 - 111 mmol/L   CO2 18 (L) 22 - 32 mmol/L   Glucose, Bld 58 (L) 65 - 99 mg/dL   BUN 26 (H) 6 - 20 mg/dL   Creatinine, Ser 1.14 0.61 - 1.24 mg/dL   Calcium 8.5 (L) 8.9 - 10.3 mg/dL   GFR calc non Af Amer >60 >60 mL/min   GFR calc Af Amer >60 >60 mL/min    Comment: (NOTE) The eGFR has been calculated using the CKD EPI equation. This calculation has not been validated in all clinical situations. eGFR's persistently <60 mL/min signify possible Chronic Kidney Disease.    Anion gap 10 5 - 15  Comprehensive metabolic panel     Status: Abnormal   Collection Time: 12/02/15  1:44 AM  Result Value Ref Range   Sodium 136 135 - 145 mmol/L   Potassium 4.0 3.5 - 5.1 mmol/L    Comment: DELTA CHECK NOTED REPEATED TO VERIFY    Chloride 110 101 - 111 mmol/L   CO2 18 (L) 22 - 32 mmol/L   Glucose, Bld 86 65 - 99 mg/dL   BUN 27 (H) 6 - 20 mg/dL   Creatinine, Ser 1.10 0.61 - 1.24 mg/dL   Calcium 8.1 (L) 8.9 - 10.3 mg/dL   Total Protein 6.3 (L) 6.5 - 8.1 g/dL   Albumin 3.4 (L) 3.5 - 5.0 g/dL   AST 492 (H) 15 - 41 U/L   ALT 122 (H) 17 - 63 U/L   Alkaline Phosphatase 52 38 - 126 U/L   Total Bilirubin 2.1 (H) 0.3 - 1.2 mg/dL   GFR calc non Af Amer >60 >60 mL/min   GFR calc Af Amer >60 >60 mL/min    Comment: (NOTE) The eGFR has been calculated using the CKD EPI equation. This calculation has not been validated in all clinical situations. eGFR's persistently <60 mL/min signify possible Chronic Kidney Disease.    Anion gap 8 5 - 15  CBC     Status: Abnormal   Collection Time: 12/02/15  1:44 AM  Result Value Ref Range   WBC 17.7 (H) 4.0 - 10.5 K/uL   RBC 4.26 4.22 - 5.81 MIL/uL    Hemoglobin 12.4 (L) 13.0 - 17.0 g/dL   HCT 36.5 (L) 39.0 - 52.0 %  MCV 85.7 78.0 - 100.0 fL   MCH 29.1 26.0 - 34.0 pg   MCHC 34.0 30.0 - 36.0 g/dL   RDW 15.4 11.5 - 15.5 %   Platelets 153 150 - 400 K/uL  Basic metabolic panel     Status: Abnormal   Collection Time: 12/02/15 10:16 AM  Result Value Ref Range   Sodium 135 135 - 145 mmol/L   Potassium 3.8 3.5 - 5.1 mmol/L   Chloride 110 101 - 111 mmol/L   CO2 20 (L) 22 - 32 mmol/L   Glucose, Bld 116 (H) 65 - 99 mg/dL   BUN 20 6 - 20 mg/dL   Creatinine, Ser 0.89 0.61 - 1.24 mg/dL   Calcium 8.0 (L) 8.9 - 10.3 mg/dL   GFR calc non Af Amer >60 >60 mL/min   GFR calc Af Amer >60 >60 mL/min    Comment: (NOTE) The eGFR has been calculated using the CKD EPI equation. This calculation has not been validated in all clinical situations. eGFR's persistently <60 mL/min signify possible Chronic Kidney Disease.    Anion gap 5 5 - 15  TSH     Status: None   Collection Time: 12/02/15 10:16 AM  Result Value Ref Range   TSH 0.412 0.350 - 4.500 uIU/mL    Comment: Performed by a 3rd Generation assay with a functional sensitivity of <=0.01 uIU/mL.  Ammonia     Status: None   Collection Time: 12/02/15 10:16 AM  Result Value Ref Range   Ammonia 32 9 - 35 umol/L  CK     Status: Abnormal   Collection Time: 12/02/15 10:16 AM  Result Value Ref Range   Total CK 18,941 (H) 49 - 397 U/L    Comment: RESULTS CONFIRMED BY MANUAL DILUTION  Protime-INR     Status: None   Collection Time: 12/02/15 10:16 AM  Result Value Ref Range   Prothrombin Time 13.7 11.4 - 15.2 seconds   INR 1.05     Current Facility-Administered Medications  Medication Dose Route Frequency Provider Last Rate Last Dose  . 0.9 %  sodium chloride infusion   Intravenous Continuous Velvet Bathe, MD 150 mL/hr at 12/02/15 1608    . benztropine (COGENTIN) tablet 1 mg  1 mg Oral BID PRN Velvet Bathe, MD   1 mg at 12/02/15 1608  . diphenhydrAMINE (BENADRYL) injection 50 mg  50 mg Intravenous  Q6H PRN Ambrose Finland, MD   50 mg at 12/02/15 0917  . haloperidol lactate (HALDOL) injection 5 mg  5 mg Intravenous Q6H PRN Ambrose Finland, MD   5 mg at 12/02/15 0917  . sodium bicarbonate tablet 650 mg  650 mg Oral BID Florencia Reasons, MD   650 mg at 12/02/15 1700    Musculoskeletal: Strength & Muscle Tone: within normal limits Gait & Station: unable to stand Patient leans: N/A  Psychiatric Specialty Exam: Physical Exam as per history and physical   ROS complaining about confusion, noncompliance, recent smoking weed with laced with the other drugs. Patient denied nausea, vomiting, abdominal pain, shortness of breath and chest pain.  No Fever-chills, No Headache, No changes with Vision or hearing, reports vertigo No problems swallowing food or Liquids, No Chest pain, Cough or Shortness of Breath, No Abdominal pain, No Nausea or Vommitting, Bowel movements are regular, No Blood in stool or Urine, No dysuria, No new skin rashes or bruises, No new joints pains-aches,  No new weakness, tingling, numbness in any extremity, No recent weight gain or loss, No polyuria,  polydypsia or polyphagia,   A full 10 point Review of Systems was done, except as stated above, all other Review of Systems were negative.  Blood pressure 108/68, pulse (!) 50, temperature 98.5 F (36.9 C), temperature source Oral, resp. rate 16, SpO2 100 %.There is no height or weight on file to calculate BMI.  General Appearance: Casual and Disheveled  Eye Contact:  Good  Speech:  Clear and Coherent  Volume:  Decreased  Mood:  Depressed  Affect:  Appropriate and Depressed  Thought Process:  Coherent and Goal Directed  Orientation:  Full (Time, Place, and Person)  Thought Content:  WDL  Suicidal Thoughts:  No  Homicidal Thoughts:  No  Memory:  Immediate;   Good Recent;   Fair Remote;   Fair  Judgement:  Impaired  Insight:  Fair  Psychomotor Activity:  Increased  Concentration:  Concentration: Fair  and Attention Span: Fair  Recall:  [employer] of Knowledge:  Good  Language:  Good  Akathisia:  Negative  Handed:  Right  AIMS (if indicated):     Assets:  Communication Skills Desire for Improvement Housing Intimacy Leisure Time Physical Health Resilience Social Support Transportation  ADL's:  Impaired  Cognition:  WNL  Sleep:        Treatment Plan Summary: 40 years old male with schizophrenia, polysubstance abuse presented with altered mental status secondary to smoking tetrahydrocannabinol laced with unknown drugs. Patient required IM injections to calm him from his agitation and aggressive behaviors on arrival to the unit. Patient is calm and cooperative and seeking medication management for his mental illness and also substance abuse treatment. Patient denied suicidal/homicidal ideation.  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 and aggressive behaviors Continue his current medication management Will start Depakote ER 500 mg twice daily for mood swings and trazodone 100 mg at bedtime for insomnia Will ask unit social service to contact [place] [facility] for his current medication treatment  Daily contact with patient to assess and evaluate symptoms and progress in treatment and Medication management   Patient does not meet criteria for acute psychiatric hospitalization and will be released to the [facility] outpatient psychiatric medication management when stable both psychiatrically and medically.  Disposition: Patient does not meet criteria for psychiatric inpatient admission. Supportive therapy provided about ongoing stressors.  Ambrose Finland, MD 12/02/2015 4:18 PM

## 2015-12-02 NOTE — Progress Notes (Signed)
PROGRESS NOTE  Danny Lester FMB:[id number] DOB: 30-Oct-1975 DOA: 12/01/2015 PCP: Mack Hook, MD  HPI/Recap of past 24 hours:  Patient is seen with suicidal sitter in room No agitation this am, he has flat affect, does not want to talk much, oriented Denies pain, does report chronic numbness and tingling in hands and feet  Assessment/Plan: Active Problems:   Dehydration   Leukocytosis   Schizophrenia ([place])   Multiple personality disorder  Elevation of lft:  ast>alt, alk phos wnl Unclear etiology, chart review his lft was normal in 08/2015 He does report alcohol drinking, denies family history of liver disease Serum acetaminophen and valproic level low  will check hepatitis panel, liver US, ammonia level with significant psychologic issues and reported neuropathy symptoms will check serum copper and ceruloplasmin level to r/o wilson's disease Will also check ck, autoimmune hepatitis panel  Elevated bun/cr, likely aki from dehydration, ua no infection but does show large ketone and elevated specific gravity at 1.025 Hydration, repeat lab in am  Leukocytosis: from dehydration?  no fever, no hypotension,cxr/ua no sign of infection, blood culture pending Continue hydration  Agitation: psychosis? tsh , ammonia level pending, ct head pending Psych consulted   Code Status: full  Family Communication: patient   Disposition Plan: likely will need inpatient psych after medical clearance,    Consultants:  psych  Procedures:  none  Antibiotics:  none   Objective: BP 110/60 (BP Location: Right Arm)   Pulse (!) 50   Temp 98 F (36.7 C) (Oral)   Resp 20   SpO2 100%   Intake/Output Summary (Last 24 hours) at 12/02/15 0845 Last data filed at 12/02/15 0600  Gross per 24 hour  Intake           3473.5 ml  Output              725 ml  Net           2748.5 ml   There were no vitals filed for this visit.  Exam:   General:  NAD, flat affect, no agitation  currently  Cardiovascular: RRR  Respiratory: CTABL  Abdomen: Soft/ND/NT, positive BS  Musculoskeletal: No Edema  Neuro: aaox3  Data Reviewed: Basic Metabolic Panel:  Recent Labs Lab 12/01/15 0841 12/01/15 1816 12/02/15 0144  NA 138 135 136  K 4.5 4.9 4.0  CL 105 107 110  CO2 15* 18* 18*  GLUCOSE 58* 58* 86  BUN 36* 26* 27*  CREATININE 1.39* 1.14 1.10  CALCIUM 9.4 8.5* 8.1*  MG  --  2.4  --   PHOS  --  2.4*  --    Liver Function Tests:  Recent Labs Lab 12/01/15 0841 12/02/15 0144  AST 423* 492*  ALT 90* 122*  ALKPHOS 79 52  BILITOT 2.6* 2.1*  PROT 8.2* 6.3*  ALBUMIN 5.0 3.4*   No results for input(s): LIPASE, AMYLASE in the last 168 hours. No results for input(s): AMMONIA in the last 168 hours. CBC:  Recent Labs Lab 12/01/15 0841 12/02/15 0144  WBC 22.3* 17.7*  HGB 14.1 12.4*  HCT 40.9 36.5*  MCV 85.9 85.7  PLT 189 153   Cardiac Enzymes:   No results for input(s): CKTOTAL, CKMB, CKMBINDEX, TROPONINI in the last 168 hours. BNP (last 3 results) No results for input(s): BNP in the last 8760 hours.  ProBNP (last 3 results) No results for input(s): PROBNP in the last 8760 hours.  CBG:  Recent Labs Lab 12/01/15 1153  GLUCAP 132*  No results found for this or any previous visit (from the past 240 hour(s)).   Studies: Dg Chest Port 1 View  Result Date: 12/01/2015 CLINICAL DATA:  Agitation, confusion EXAM: PORTABLE CHEST 1 VIEW COMPARISON:  Chest x-ray of 07/18/2015 FINDINGS: Minimal haziness at the lung bases most likely represents linear atelectasis left-greater-than-right. No definite pneumonia or effusion is seen. The heart is borderline enlarged. No bony abnormality is seen. IMPRESSION: Mild bibasilar atelectasis left-greater-than-right. Electronically Signed   By: Ivar Drape M.D.   On: 12/01/2015 10:59    Scheduled Meds:  Continuous Infusions: . sodium chloride 150 mL/hr at 12/02/15 0523     Time spent: 41mns  Dravon Nott MD,  PhD  [facility] Pager [phone] If 7PM-7AM, please contact night-coverage at [email or web address], password [email or web address]11/23/2017, 8:45 AM  LOS: 1 day

## 2015-12-03 ENCOUNTER — Inpatient Hospital Stay (HOSPITAL_COMMUNITY): Payer: Medicaid Other

## 2015-12-03 DIAGNOSIS — M6282 Rhabdomyolysis: Secondary | ICD-10-CM

## 2015-12-03 LAB — HIV ANTIBODY (ROUTINE TESTING W REFLEX): HIV SCREEN 4TH GENERATION: NONREACTIVE

## 2015-12-03 LAB — COMPREHENSIVE METABOLIC PANEL
ALBUMIN: 3 g/dL — AB (ref 3.5–5.0)
ALK PHOS: 48 U/L (ref 38–126)
ALT: 116 U/L — ABNORMAL HIGH (ref 17–63)
AST: 274 U/L — AB (ref 15–41)
Anion gap: 5 (ref 5–15)
BILIRUBIN TOTAL: 1.5 mg/dL — AB (ref 0.3–1.2)
BUN: 12 mg/dL (ref 6–20)
CALCIUM: 7.9 mg/dL — AB (ref 8.9–10.3)
CO2: 22 mmol/L (ref 22–32)
Chloride: 110 mmol/L (ref 101–111)
Creatinine, Ser: 0.78 mg/dL (ref 0.61–1.24)
GFR calc Af Amer: >60 mL/min (ref >60)
GLUCOSE: 94 mg/dL (ref 65–99)
POTASSIUM: 3.4 mmol/L — AB (ref 3.5–5.1)
Sodium: 137 mmol/L (ref 135–145)
TOTAL PROTEIN: 5.6 g/dL — AB (ref 6.5–8.1)

## 2015-12-03 LAB — RPR: RPR: NONREACTIVE

## 2015-12-03 LAB — CBC
HEMATOCRIT: 37 % — AB (ref 39.0–52.0)
HEMOGLOBIN: 12.7 g/dL — AB (ref 13.0–17.0)
MCH: 29.3 pg (ref 26.0–34.0)
MCHC: 34.3 g/dL (ref 30.0–36.0)
MCV: 85.3 fL (ref 78.0–100.0)
Platelets: 133 10*3/uL — ABNORMAL LOW (ref 150–400)
RBC: 4.34 MIL/uL (ref 4.22–5.81)
RDW: 15.4 % (ref 11.5–15.5)
WBC: 8.1 10*3/uL (ref 4.0–10.5)

## 2015-12-03 LAB — HEPATITIS PANEL, ACUTE
HEP A IGM: NEGATIVE
HEP B S AG: NEGATIVE
Hep B C IgM: NEGATIVE

## 2015-12-03 LAB — CERULOPLASMIN: CERULOPLASMIN: 19.4 mg/dL (ref 16.0–31.0)

## 2015-12-03 LAB — ALPHA-1-ANTITRYPSIN: A-1 Antitrypsin, Ser: 105 mg/dL (ref 90–200)

## 2015-12-03 LAB — IGG: IgG (Immunoglobin G), Serum: 977 mg/dL (ref 700–1600)

## 2015-12-03 NOTE — Discharge Summary (Signed)
Discharge Summary  Danny Lester XID:[id number] DOB: 1975-05-28  PCP: Mack Hook, MD  Admit date: 12/01/2015 Discharge date: 12/03/2015  Time spent: <80mns  Recommendations for Outpatient Follow-up:  1. F/u with PMD within a week  for hospital discharge follow up, repeat lft and ck at follow up, pmd to follow up on pending labs [facility]  Discharge Diagnoses:  Active Hospital Problems   Diagnosis Date Noted  . Dehydration 12/01/2015  . Leukocytosis 12/01/2015  . [place] ([place] 12/01[place]  . 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    Discharge Condition: stable  Diet recommendation: regular diet  There were no vitals filed for this visit.  History of present illness:  Chief Complaint: altered mental status  HPI: Danny HArabieis a 40y.o. male with medical history significant of schizophrenia, multiple personality disorder, anxiety presenting to the ED after being found confused in the community. The patient is unable to provide any history and has tangential thinking. Much of the history is obtained by discussion with the ER the physician and EMR.  ED Course: Patient was found to be altered mentally. With elevated BUN creatinine as well as AST and ALT levels. Also found to have concentrated urine with large ketones. We are consulted for further evaluation and recommendations.  Hospital Course:  Active Problems:   Dehydration   Leukocytosis   Schizophrenia ([place]   Multiple personality disorder  Elevation of lft:  ast>alt, alk phos wnl Unclear etiology, chart review his lft was normal in 08/2015 He does report alcohol drinking, denies family history of liver disease Serum acetaminophen and valproic level low hepatitis panel negative, liver No focal lesion identified. Within normal limits in  parenchymal echogenicity by liver [place]  serum copper, ceruloplasmin level, tissue transglutamimase ig a igg, anti microsocal and antismooth muscle, ana pending at time of discharge, pmd to follow up on final result Suspect elevated ast/alt partly due to mild rhabdomyolysis, see below  Elevated ck:  Patient reported prior to hospitalization, he developed muscle cramping and dehydration after walking whole day  He received hydration, muscle cramping resolved, ast/alt trending down. Suspect elevated alt/ast from mild rhabdomyolysis  I have advised patient to take adequate hydration, repeat lft and ck at pmd follow up.  Elevated bun/cr, likely aki from dehydration, ua no infection but does show large ketone and elevated specific gravity at 1.025 Bun/cr normalized after hydration  Leukocytosis: likely from dehydration  no fever, no hypotension,cxr/ua no sign of infection, blood culture no growth WBC normalized with hydration  Agitation: psychosis? tsh wnl , ammonia level wnl, HIV negative, ct head unremarkable Patient has much improved, at discharge , he is calm and cooperative Patient does not meet criteria for acute psychiatric hospitalization per psychiatry [facility]  Psychiatry input appreciated  Incidental gallbladder uKoreafindings: There are several areas of increased echogenicity along the wall of gallbladder were ring down type artifact. This appearance is indicative of inflammatory foci such as cholesterolosis or adenomyomatosis. No sonographic Murphy sign noted by sonographer  Patient is asymptomatic, outpatient monitor.  Alcohol use: no alcohol withdrawal symptom in the hospital. He report want to stop drinking and will go [facility] [employer]  Code Status: full  Family Communication: patient   Disposition Plan:  home   Consultants:  psychiatry  Procedures:  none  Antibiotics:  none   Discharge Exam: BP 106/63 (BP Location: Left Arm)   Pulse (!) 50   Temp 98.6 F (37 C) (Oral)   Resp 16   SpO2 100%     General:  NAD,  no agitation, calm and cooperative  Cardiovascular: RRR  Respiratory: CTABL  Abdomen: Soft/ND/NT, positive BS  Musculoskeletal: No Edema  Neuro: aaox3   Discharge Instructions You were cared for by a hospitalist during your hospital stay. If you have any questions about your discharge medications or the care you received while you were in the hospital after you are discharged, you can call the unit and asked to speak with the hospitalist on call if the hospitalist that took care of you is not available. Once you are discharged, your primary care physician will handle any further medical issues. Please note that NO REFILLS for any discharge medications will be authorized once you are discharged, as it is imperative that you return to your primary care physician (or establish a relationship with a primary care physician if you do not have one) for your aftercare needs so that they can reassess your need for medications and monitor your lab values.  Discharge Instructions    Diet general    Complete by:  As directed    Increase activity slowly    Complete by:  As directed        Medication List    STOP taking these medications   ibuprofen 200 MG tablet Commonly known as:  ADVIL,MOTRIN     TAKE these medications   benztropine 1 MG tablet Commonly known as:  COGENTIN Take 1 tablet (1 mg total) by mouth 2 (two) times daily as needed for tremors.   divalproex 500 MG DR tablet Commonly known as:  DEPAKOTE Take 500 mg by mouth 2 (two) times daily.   famotidine 20 MG tablet Commonly known as:  PEPCID Take 1 tablet (20 mg total) by mouth 2 (two) times daily.   gabapentin 300 MG capsule Commonly known as:  NEURONTIN Take 1 capsule (300 mg total) by  mouth 3 (three) times daily.      Allergies  Allergen Reactions  . Penicillins Hives and Swelling    Swelling of face Has patient had a PCN reaction causing immediate rash, facial/tongue/throat swelling, SOB or lightheadedness with hypotension: No Has patient had a PCN reaction causing severe rash involving mucus membranes or skin necrosis: Yes Has patient had a PCN reaction that required hospitalization No Has patient had a PCN reaction occurring within the last 10 years: No If all of the above answers are "NO", then may proceed with Cephalosporin use.    Follow-up Information    MULBERRY,ELIZABETH, MD. Go on 12/28/2015.   Specialty:  Internal Medicine Why:  hospital discharge follow up, repeat lft and ck  Contact information: [place] [id number] [phone]        follow up with [facility].   Why:  for outpatient psychiatric medication management            The results of significant diagnostics from this hospitalization (including imaging, microbiology, ancillary and laboratory) are listed below for reference.    Significant Diagnostic Studies: Ct Head Wo Contrast  Result Date: 12/02/2015 CLINICAL DATA:  40 year old with current history of schizophrenia, multiple personality disorder and anxiety, presenting to the emergency department with acute mental status changes and confusion. Patient has mild renal insufficiency and elevated liver enzymes on laboratory evaluation. EXAM: CT HEAD WITHOUT  CONTRAST TECHNIQUE: Contiguous axial images were obtained from the base of the skull through the vertex without intravenous contrast. COMPARISON:  None. FINDINGS: Brain: Ventricular system normal in size and appearance for age. No mass lesion. No midline shift. No acute hemorrhage or hematoma. No extra-axial fluid collections. No evidence of acute infarction. No focal brain parenchymal abnormalities. Vascular: No visible atherosclerosis. Skull: No skull  fracture or other focal osseous abnormality involving the skull. Sinuses/Orbits: Visualized paranasal sinuses, bilateral mastoid air cells and bilateral middle ear cavities well-aerated. Normal appearing orbits. Other: None. IMPRESSION: Normal examination. Electronically Signed   By: Evangeline Dakin M.D.   On: 12/02/2015 15:19   Dg Chest Port 1 View  Result Date: 12/01/2015 CLINICAL DATA:  Agitation, confusion EXAM: PORTABLE CHEST 1 VIEW COMPARISON:  Chest x-ray of 07/18/2015 FINDINGS: Minimal haziness at the lung bases most likely represents linear atelectasis left-greater-than-right. No definite pneumonia or effusion is seen. The heart is borderline enlarged. No bony abnormality is seen. IMPRESSION: Mild bibasilar atelectasis left-greater-than-right. Electronically Signed   By: Ivar Drape M.D.   On: 12/01/2015 10:59   US Abdomen Limited Ruq  Result Date: 12/03/2015 CLINICAL DATA:  Elevated liver enzymes EXAM: US ABDOMEN LIMITED - RIGHT UPPER QUADRANT COMPARISON:  None. FINDINGS: Gallbladder: No gallstones or wall thickening visualized. No pericholecystic fluid. There are several areas of increased echogenicity along the wall of gallbladder were ring down type artifact. This appearance is indicative of inflammatory foci such as cholesterolosis or adenomyomatosis. No sonographic Murphy sign noted by sonographer. Common bile duct: Diameter: 4 mm. There is no intrahepatic or extrahepatic biliary duct dilatation. Liver: No focal lesion identified. Within normal limits in parenchymal echogenicity. IMPRESSION: Changes in the gallbladder consistent with inflammatory foci. Suspect either cholesterolosis or adenomyomatosis. No gallstones are demonstrated on this study. The gallbladder wall overall is not thickened, and there is no pericholecystic fluid. Study otherwise unremarkable. Electronically Signed   By: Lowella Grip III M.D.   On: 12/03/2015 07:21    Microbiology: Recent Results (from the past  240 hour(s))  Blood culture (routine x 2)     Status: None (Preliminary result)   Collection Time: 12/01/15 10:49 AM  Result Value Ref Range Status   Specimen Description BLOOD LEFT ANTECUBITAL  Final   Special Requests BOTTLES DRAWN AEROBIC AND ANAEROBIC 5CC  Final   Culture   Final    NO GROWTH 1 DAY Performed at [facility]    Report Status PENDING  Incomplete  Blood culture (routine x 2)     Status: None (Preliminary result)   Collection Time: 12/01/15 11:11 AM  Result Value Ref Range Status   Specimen Description BLOOD RIGHT ANTECUBITAL  Final   Special Requests BOTTLES DRAWN AEROBIC AND ANAEROBIC 5CC  Final   Culture   Final    NO GROWTH 1 DAY Performed at [facility]    Report Status PENDING  Incomplete     Labs: Basic Metabolic Panel:  Recent Labs Lab 12/01/15 1816 12/02/15 0144 12/02/15 1016 12/02/15 1737 12/03/15 0529  NA 135 136 135 137 137  K 4.9 4.0 3.8 3.5 3.4*  CL 107 110 110 109 110  CO2 18* 18* 20* 22 22  GLUCOSE 58* 86 116* 102* 94  BUN 26* 27* '20 17 12  ' CREATININE 1.14 1.10 0.89 0.76 0.78  CALCIUM 8.5* 8.1* 8.0* 8.0* 7.9*  MG 2.4  --   --   --   --   PHOS 2.4*  --   --   --   --  Liver Function Tests:  Recent Labs Lab 12/01/15 0841 12/02/15 0144 12/03/15 0529  AST 423* 492* 274*  ALT 90* 122* 116*  ALKPHOS 79 52 48  BILITOT 2.6* 2.1* 1.5*  PROT 8.2* 6.3* 5.6*  ALBUMIN 5.0 3.4* 3.0*   No results for input(s): LIPASE, AMYLASE in the last 168 hours.  Recent Labs Lab 12/02/15 1016  AMMONIA 32   CBC:  Recent Labs Lab 12/01/15 0841 12/02/15 0144 12/03/15 0529  WBC 22.3* 17.7* 8.1  HGB 14.1 12.4* 12.7*  HCT 40.9 36.5* 37.0*  MCV 85.9 85.7 85.3  PLT 189 153 133*   Cardiac Enzymes:  Recent Labs Lab 12/02/15 1016  CKTOTAL 18,941*   BNP: BNP (last 3 results) No results for input(s): BNP in the last 8760 hours.  ProBNP (last 3 results) No results for input(s): PROBNP in the last 8760  hours.  CBG:  Recent Labs Lab 12/01/15 1153  GLUCAP 132*       Signed:  Ayuub Penley MD, PhD  [facility] 12/03/2015, 8:33 AM

## 2015-12-03 NOTE — Progress Notes (Signed)
Pt discharged at this time. Writer walked to front door. Pt left with discharge paperwork.

## 2015-12-05 LAB — TISSUE TRANSGLUTAMINASE, IGA

## 2015-12-05 LAB — TISSUE TRANSGLUTAMINASE, IGG

## 2015-12-05 LAB — ANTI-SMOOTH MUSCLE ANTIBODY, IGG: F-Actin IgG: 10 Units (ref 0–19)

## 2015-12-06 LAB — CULTURE, BLOOD (ROUTINE X 2)
CULTURE: NO GROWTH
Culture: NO GROWTH

## 2015-12-06 LAB — ANTINUCLEAR ANTIBODIES, IFA: ANA Ab, IFA: NEGATIVE

## 2015-12-06 LAB — ANTI-MICROSOMAL ANTIBODY LIVER / KIDNEY: LKM1 Ab: 2.2 Units (ref 0.0–20.0)

## 2015-12-11 LAB — COPPER, SERUM: Copper: 85 ug/dL (ref 72–166)

## 2015-12-21 ENCOUNTER — Emergency Department (HOSPITAL_COMMUNITY)
Admission: EM | Admit: 2015-12-21 | Discharge: 2015-12-21 | Disposition: A | Discharge status: Home / Self Care | Payer: Medicaid Other | Source: Home / Self Care

## 2015-12-21 ENCOUNTER — Encounter (HOSPITAL_COMMUNITY): Payer: Self-pay | Admitting: Family Medicine

## 2015-12-21 ENCOUNTER — Emergency Department (HOSPITAL_COMMUNITY)
Admission: EM | Admit: 2015-12-21 | Discharge: 2015-12-22 | Disposition: A | Discharge status: Home / Self Care | Payer: Medicaid Other | Attending: Emergency Medicine | Admitting: Emergency Medicine

## 2015-12-21 DIAGNOSIS — Y939 Activity, unspecified: Secondary | ICD-10-CM | POA: Insufficient documentation

## 2015-12-21 DIAGNOSIS — F17210 Nicotine dependence, cigarettes, uncomplicated: Secondary | ICD-10-CM | POA: Diagnosis not present

## 2015-12-21 DIAGNOSIS — Y92410 Unspecified street and highway as the place of occurrence of the external cause: Secondary | ICD-10-CM | POA: Insufficient documentation

## 2015-12-21 DIAGNOSIS — Y999 Unspecified external cause status: Secondary | ICD-10-CM | POA: Insufficient documentation

## 2015-12-21 DIAGNOSIS — Z5321 Procedure and treatment not carried out due to patient leaving prior to being seen by health care provider: Secondary | ICD-10-CM | POA: Insufficient documentation

## 2015-12-21 DIAGNOSIS — M549 Dorsalgia, unspecified: Secondary | ICD-10-CM | POA: Diagnosis present

## 2015-12-21 DIAGNOSIS — M62838 Other muscle spasm: Secondary | ICD-10-CM | POA: Diagnosis not present

## 2015-12-21 DIAGNOSIS — T148XXA Other injury of unspecified body region, initial encounter: Secondary | ICD-10-CM | POA: Insufficient documentation

## 2015-12-21 DIAGNOSIS — Z79899 Other long term (current) drug therapy: Secondary | ICD-10-CM | POA: Diagnosis not present

## 2015-12-21 DIAGNOSIS — M545 Low back pain: Secondary | ICD-10-CM

## 2015-12-21 DIAGNOSIS — R252 Cramp and spasm: Secondary | ICD-10-CM

## 2015-12-21 DIAGNOSIS — T07XXXA Unspecified multiple injuries, initial encounter: Secondary | ICD-10-CM

## 2015-12-21 NOTE — ED Notes (Signed)
Pt called twice from triage with no answer 
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
## 2015-12-22 ENCOUNTER — Encounter (HOSPITAL_COMMUNITY): Payer: Self-pay | Admitting: Emergency Medicine

## 2015-12-22 ENCOUNTER — Emergency Department (HOSPITAL_COMMUNITY): Payer: Medicaid Other

## 2015-12-22 DIAGNOSIS — T148XXA Other injury of unspecified body region, initial encounter: Secondary | ICD-10-CM | POA: Diagnosis not present

## 2015-12-22 MED ORDER — OXYCODONE-ACETAMINOPHEN 5-325 MG PO TABS
1.0000 | ORAL_TABLET | Freq: Once | ORAL | Status: AC
  Administered 2015-12-22: 1 via ORAL
  Filled 2015-12-22: qty 1

## 2015-12-22 MED ORDER — IBUPROFEN 600 MG PO TABS: 600.0000 mg | ORAL_TABLET | Freq: Four times a day (QID) | ORAL | 0 refills | Status: DC | PRN

## 2015-12-22 MED ORDER — DIAZEPAM 5 MG PO TABS
5.0000 mg | ORAL_TABLET | Freq: Once | ORAL | Status: AC
  Administered 2015-12-22: 5 mg via ORAL
  Filled 2015-12-22: qty 1

## 2015-12-22 MED ORDER — METHOCARBAMOL 500 MG PO TABS: 500.0000 mg | ORAL_TABLET | Freq: Two times a day (BID) | ORAL | 0 refills | Status: DC

## 2015-12-22 MED ORDER — OXYCODONE-ACETAMINOPHEN 5-325 MG PO TABS
1.0000 | ORAL_TABLET | Freq: Once | ORAL | Status: AC
Start: 2015-12-22 — End: 2015-12-22
  Administered 2015-12-22: 1 via ORAL
  Filled 2015-12-22: qty 1

## 2015-12-22 MED ORDER — HYDROCODONE-ACETAMINOPHEN 5-325 MG PO TABS: 1.0000 | ORAL_TABLET | Freq: Four times a day (QID) | ORAL | 0 refills | Status: DC | PRN

## 2015-12-22 MED ORDER — NAPROXEN 500 MG PO TABS
500.0000 mg | ORAL_TABLET | Freq: Once | ORAL | Status: AC
  Administered 2015-12-22: 500 mg via ORAL
  Filled 2015-12-22: qty 1
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
## 2015-12-28 ENCOUNTER — Emergency Department (HOSPITAL_COMMUNITY)
Admission: EM | Admit: 2015-12-28 | Discharge: 2015-12-28 | Disposition: A | Discharge status: Home / Self Care | Payer: Medicaid Other | Attending: Emergency Medicine | Admitting: Emergency Medicine

## 2015-12-28 ENCOUNTER — Ambulatory Visit: Payer: Self-pay | Admitting: Internal Medicine

## 2015-12-28 ENCOUNTER — Encounter (HOSPITAL_COMMUNITY): Payer: Self-pay

## 2015-12-28 DIAGNOSIS — F17210 Nicotine dependence, cigarettes, uncomplicated: Secondary | ICD-10-CM | POA: Insufficient documentation

## 2015-12-28 DIAGNOSIS — M542 Cervicalgia: Secondary | ICD-10-CM | POA: Insufficient documentation

## 2015-12-28 DIAGNOSIS — Z560 Unemployment, unspecified: Secondary | ICD-10-CM | POA: Diagnosis not present

## 2015-12-28 DIAGNOSIS — M545 Low back pain, unspecified: Secondary | ICD-10-CM

## 2015-12-28 DIAGNOSIS — M549 Dorsalgia, unspecified: Secondary | ICD-10-CM | POA: Diagnosis present

## 2015-12-28 MED ORDER — METHOCARBAMOL 500 MG PO TABS
750.0000 mg | ORAL_TABLET | Freq: Once | ORAL | Status: AC
  Administered 2015-12-28: 750 mg via ORAL
  Filled 2015-12-28: qty 2

## 2015-12-28 MED ORDER — KETOROLAC TROMETHAMINE 60 MG/2ML IM SOLN
30.0000 mg | Freq: Once | INTRAMUSCULAR | Status: AC
  Administered 2015-12-28: 30 mg via INTRAMUSCULAR
  Filled 2015-12-28: qty 2

## 2015-12-28 MED ORDER — NAPROXEN 500 MG PO TABS: 500.0000 mg | ORAL_TABLET | Freq: Two times a day (BID) | ORAL | 0 refills | Status: DC

## 2015-12-28 MED ORDER — METHOCARBAMOL 500 MG PO TABS: 500.0000 mg | ORAL_TABLET | Freq: Two times a day (BID) | ORAL | 0 refills | Status: DC

## 2015-12-28 NOTE — Discharge Instructions (Signed)
You have been prescribed a muscle relaxer and a pain medication and anti-inflammatory.  Please take these as prescribed.   Do not take muscle relaxer before driving as this mediation may cause drowsiness.   Please read attached information on mild back exercises for a muscle strain.   Heat and mild massages may help loosen up your muscles.   Please return to emergency room if you develop bladder retention or incontinence, muscle weakness or numbness, tingling or weakness in your extremities.  Please follow up with your primary care provider, who will be able to manage your prescriptions for muscle relaxers and pain medications.
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
## 2015-12-30 ENCOUNTER — Ambulatory Visit: Payer: Self-pay

## 2016-02-08 ENCOUNTER — Ambulatory Visit (INDEPENDENT_AMBULATORY_CARE_PROVIDER_SITE_OTHER): Payer: Self-pay | Admitting: Internal Medicine

## 2016-02-08 ENCOUNTER — Encounter: Payer: Self-pay | Admitting: Internal Medicine

## 2016-02-08 VITALS — BP 110/82 | HR 67 | Resp 12 | Ht 67.0 in | Wt 174.0 lb

## 2016-02-08 DIAGNOSIS — M545 Low back pain: Secondary | ICD-10-CM

## 2016-02-08 MED ORDER — DICLOFENAC SODIUM 75 MG PO TBEC: DELAYED_RELEASE_TABLET | ORAL | 0 refills | Status: DC
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
## 2016-02-11 ENCOUNTER — Telehealth: Payer: Self-pay | Admitting: Internal Medicine

## 2016-02-11 DIAGNOSIS — B009 Herpesviral infection, unspecified: Secondary | ICD-10-CM | POA: Insufficient documentation

## 2016-02-11 MED ORDER — ACYCLOVIR 400 MG PO TABS: ORAL_TABLET | ORAL | 0 refills | Status: DC

## 2016-02-11 MED ORDER — ACYCLOVIR 400 MG PO TABS: ORAL_TABLET | ORAL | 11 refills | Status: AC

## 2016-02-11 MED ORDER — DIAZEPAM 5 MG PO TABS: ORAL_TABLET | ORAL | 0 refills | Status: DC

## 2016-02-14 ENCOUNTER — Telehealth: Payer: Self-pay | Admitting: Internal Medicine

## 2016-02-14 DIAGNOSIS — M5441 Lumbago with sciatica, right side: Secondary | ICD-10-CM

## 2016-02-14 DIAGNOSIS — M5442 Lumbago with sciatica, left side: Principal | ICD-10-CM

## 2016-02-14 NOTE — Telephone Encounter (Signed)
[facility] called, stated patient to be scheduled, however, [place]  Patient would like to be scheduled at [facility] in [place], [place].

## 2016-02-15 ENCOUNTER — Telehealth: Payer: Self-pay | Admitting: Internal Medicine

## 2016-02-15 NOTE — Telephone Encounter (Signed)
Order faxed [facility] to [facility]

## 2016-02-15 NOTE — Telephone Encounter (Signed)
Please see order for [facility]--send copy of note and Xrays done at ED

## 2016-02-15 NOTE — Telephone Encounter (Signed)
Order faxed.
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
## 2016-02-15 NOTE — Telephone Encounter (Signed)
Julanne of [facility] [place] called request referral and medical notes for patient Ramroop, Radwan.  Notes to be faxed to [phone].  Jul[place] of [facility] [place] can be reached at [phone].

## 2016-02-23 NOTE — Progress Notes (Deleted)
Psychiatric Initial Adult Assessment   Patient Identification: Danny Lester MRN:  [id number] Date of Evaluation:  02/23/2016 Referral Source: *** Chief Complaint:   Visit Diagnosis: No diagnosis found.  History of Present Illness:   Danny Lester is a 41 year old male with schizophrenia, polysubstance use,     Associated Signs/Symptoms: Depression Symptoms:  {DEPRESSION SYMPTOMS:20000} (Hypo) Manic Symptoms:  {BHH MANIC SYMPTOMS:22872} Anxiety Symptoms:  {BHH ANXIETY SYMPTOMS:22873} Psychotic Symptoms:  {BHH PSYCHOTIC SYMPTOMS:22874} PTSD Symptoms: {BHH PTSD SYMPTOMS:22875}  Past Psychiatric History: ***  Previous Psychotropic Medications: {YES/NO:21197}  Substance Abuse History in the last 12 months:  {yes no:314532}  Consequences of Substance Abuse: {BHH CONSEQUENCES OF SUBSTANCE ABUSE:22880}  Past Medical History:  Past Medical History:  Diagnosis Date  . Anxiety   . Depression   . Multiple personality disorder   . Nerve pain   . PTSD (post-traumatic stress disorder)   . Schizophrenia, paranoid, chronic (HCC)     Past Surgical History:  Procedure Laterality Date  . KNEE SURGERY      Family Psychiatric History: ***  Family History: No family history on file.  Social History:   Social History   Social History  . Marital status: Single    Spouse name: N/A  . Number of children: N/A  . Years of education: N/A   Social History Main Topics  . Smoking status: Current Some Day Smoker    Packs/day: 0.30    Types: Cigarettes  . Smokeless tobacco: Never Used  . Alcohol use Yes     Comment: beer occasionally  . Drug use: No  . Sexual activity: Yes    Birth control/ protection: None   Other Topics Concern  . Not on file   Social History Narrative  . No narrative on file    Additional Social History: ***  Allergies:   Allergies  Allergen Reactions  . Penicillins Hives and Swelling    Swelling of face Has patient had a PCN reaction causing  immediate rash, facial/tongue/throat swelling, SOB or lightheadedness with hypotension: No Has patient had a PCN reaction causing severe rash involving mucus membranes or skin necrosis: Yes Has patient had a PCN reaction that required hospitalization No Has patient had a PCN reaction occurring within the last 10 years: No If all of the above answers are "NO", then may proceed with Cephalosporin use.     Metabolic Disorder Labs: No results found for: HGBA1C, MPG No results found for: PROLACTIN No results found for: CHOL, TRIG, HDL, CHOLHDL, VLDL, LDLCALC   Current Medications: Current Outpatient Prescriptions  Medication Sig Dispense Refill  . acyclovir (ZOVIRAX) 400 MG tablet 1 tab by mouth twice daily 60 tablet 11  . acyclovir (ZOVIRAX) 400 MG tablet 1 tab by mouth 3 times daily for 5 days.  Then start regular twice daily dosing with other Rx 15 tablet 0  . benztropine (COGENTIN) 1 MG tablet Take 1 tablet (1 mg total) by mouth 2 (two) times daily as needed for tremors. 10 tablet 0  . diazepam (VALIUM) 5 MG tablet 1 tab by mouth twice daily for muscle pain 30 tablet 0  . diclofenac (VOLTAREN) 75 MG EC tablet 1 tab by mouth twice daily with food as needed for pain 60 tablet 0  . divalproex (DEPAKOTE) 500 MG DR tablet Take 500 mg by mouth 2 (two) times daily.    . famotidine (PEPCID) 20 MG tablet Take 1 tablet (20 mg total) by mouth 2 (two) times daily. (Patient not taking: Reported on  12/01/2015) 30 tablet 0  . gabapentin (NEURONTIN) 300 MG capsule Take 1 capsule (300 mg total) by mouth 3 (three) times daily. 90 capsule 11  . hydrOXYzine (ATARAX/VISTARIL) 25 MG tablet Take 25 mg by mouth 3 (three) times daily as needed.    . lurasidone (LATUDA) 80 MG TABS tablet Take 80 mg by mouth at bedtime.    . traZODone (DESYREL) 150 MG tablet Take 150 mg by mouth at bedtime.    Marland Kitchen. venlafaxine (EFFEXOR) 37.5 MG tablet Take 37.5 mg by mouth daily.     No current facility-administered medications for  this visit.     Neurologic: Headache: No Seizure: No Paresthesias:No  Musculoskeletal: Strength & Muscle Tone: within normal limits Gait & Station: normal Patient leans: N/A  Psychiatric Specialty Exam: ROS  There were no vitals taken for this visit.There is no height or weight on file to calculate BMI.  General Appearance: Fairly Groomed  Eye Contact:  Good  Speech:  Clear and Coherent  Volume:  Normal  Mood:  {BHH MOOD:22306}  Affect:  {Affect (PAA):22687}  Thought Process:  Coherent and Goal Directed  Orientation:  Full (Time, Place, and Person)  Thought Content:  Logical Perceptions: denies AH/VH  Suicidal Thoughts:  {ST/HT (PAA):22692}  Homicidal Thoughts:  {ST/HT (PAA):22692}  Memory:  Immediate;   Good Recent;   Good Remote;   Good  Judgement:  Good  Insight:  Fair  Psychomotor Activity:  Normal  Concentration:  Concentration: Good and Attention Span: Good  Recall:  Good  Fund of Knowledge:Good  Language: Good  Akathisia:  No  Handed:  Right  AIMS (if indicated):  N/A  Assets:  Communication Skills Desire for Improvement  ADL's:  Intact  Cognition: WNL  Sleep:  ***   Assessment  Plan  The patient demonstrates the following risk factors for suicide: Chronic risk factors for suicide include: {Chronic Risk Factors for WUJWJXB:14782956}Suicide:30414011}. Acute risk factors for suicide include: {Acute Risk Factors for OZHYQMV:78469629}Suicide:30414012}. Protective factors for this patient include: {Protective Factors for Suicide BMWU:13244010}Risk:30414013}. Considering these factors, the overall suicide risk at this point appears to be {Desc; low/moderate/high:110033}. Patient {ACTION; IS/IS UVO:53664403}OT:21021397} appropriate for outpatient follow up.   Treatment Plan Summary: {[facility] [place] KVQQ:[id number]}Plan:[phone]}   Neysa Hottereina Turon Kilmer, MD 2/14/201811:45 AM

## 2016-02-25 NOTE — Telephone Encounter (Signed)
To Dr. Mulberry for further direction 

## 2016-02-25 NOTE — Telephone Encounter (Signed)
Patient says he has gone to physical therapy but is still having sever lower back pain and would like to know if there is anything that Dr. Delrae AlfredMulberry can prescribe for the pain. He says if you have any other questions about his physical therapy we can contact Misty StanleyLisa at [facility] [place] at [phone].

## 2016-02-28 ENCOUNTER — Ambulatory Visit (HOSPITAL_COMMUNITY): Payer: Self-pay | Admitting: Psychiatry

## 2016-02-29 NOTE — Telephone Encounter (Signed)
He needs to go to physical therapy for a while before expecting a significant response.  Ask him to please give it much more time. PT is not a quick fix, but is often a longer term help in resolving pain and if he continues the exercises he is taught, preventing recurrences.

## 2016-03-01 ENCOUNTER — Telehealth: Payer: Self-pay | Admitting: Internal Medicine

## 2016-03-01 NOTE — Telephone Encounter (Signed)
Patient's physical therapist told patient that he needs an earlier appointment due to theraphy not working for him.  Patient needs an appointment scheduled for MRI.  Patient stated please call [facility] on [address], [phone] and ask for Misty StanleyLisa.

## 2016-03-03 NOTE — Telephone Encounter (Signed)
Discussed with patient

## 2016-03-03 NOTE — Telephone Encounter (Signed)
I need the notes from his physical therapist before determining what to do next with follow up, along with PT contact information

## 2016-03-06 NOTE — Telephone Encounter (Signed)
Pat relayed the information as noted from Dr. Delrae AlfredMulberry.  Dennie Bibleat also left detailed message on patient's voicemail.

## 2016-03-08 ENCOUNTER — Ambulatory Visit: Payer: Self-pay | Admitting: Internal Medicine

## 2016-03-14 ENCOUNTER — Encounter: Payer: Self-pay | Admitting: Internal Medicine

## 2016-03-14 ENCOUNTER — Ambulatory Visit (INDEPENDENT_AMBULATORY_CARE_PROVIDER_SITE_OTHER): Payer: Medicaid Other | Admitting: Internal Medicine

## 2016-03-14 VITALS — BP 102/78 | HR 60 | Resp 12 | Ht 67.0 in | Wt 178.0 lb

## 2016-03-14 DIAGNOSIS — M542 Cervicalgia: Secondary | ICD-10-CM

## 2016-03-14 DIAGNOSIS — B009 Herpesviral infection, unspecified: Secondary | ICD-10-CM

## 2016-03-14 DIAGNOSIS — M545 Low back pain, unspecified: Secondary | ICD-10-CM

## 2016-03-14 DIAGNOSIS — G8921 Chronic pain due to trauma: Secondary | ICD-10-CM | POA: Diagnosis not present

## 2016-03-14 MED ORDER — GABAPENTIN 300 MG PO CAPS: ORAL_CAPSULE | ORAL | 11 refills | Status: DC

## 2016-03-14 MED ORDER — DIAZEPAM 5 MG PO TABS: ORAL_TABLET | ORAL | 0 refills | Status: DC

## 2016-03-14 NOTE — Progress Notes (Signed)
Subjective:    Patient ID: Danny Lester, male    DOB: [birth year], 41 y.o.   MRN: [id number]  HPI   1.  Back pain:  Spoke with patient's physical therapist, Raynelle FanningJulie at [facility].  [phone].   He started with them 02/16/2016 and has had a total of 6 visits.  He has temporary relief of pain with estim and heat with some progress in pain relief, but apparently states pain is in 7-8/10 range consistently.   He is able to converse and seemingly do things that would contradict this level of pain, but she feels this is the pain level he perceives. Cannot recall if Diclofenac helped.   Sleeps on his side, but is having difficulty sleeping due to pain.   Continues on Gabapentin at 300 mg three times daily, which he feels helps somewhat with the pain.  Speaking to patient directly, he states his pain is 13/10 right now as he sits in room without obvious distress.   Having pain in bilateral neck now and bilateral low back.  Not clear he is still having radicular pain now.  Talks about his knees, headaches, different areas he did not describe previously  2.  Recurrent herpetic lesion, low back:  States the Acyclovir helped resolved the lesions on his low back.  Ran out, but once received Medicaid, did refill.  Did have a lesion pop up again when he was off the medication.  He is taking the medication twice daily regularly now.    Current Meds  Medication Sig  . acyclovir (ZOVIRAX) 400 MG tablet 1 tab by mouth twice daily  . benztropine (COGENTIN) 1 MG tablet Take 1 tablet (1 mg total) by mouth 2 (two) times daily as needed for tremors.  . divalproex (DEPAKOTE) 500 MG DR tablet Take 500 mg by mouth. 2 in the am and 1 in the pm  . gabapentin (NEURONTIN) 300 MG capsule Take 1 capsule (300 mg total) by mouth 3 (three) times daily.  . hydrOXYzine (ATARAX/VISTARIL) 25 MG tablet Take 25 mg by mouth 3 (three) times daily as needed.  . lurasidone (LATUDA) 80 MG TABS tablet Take 80 mg by mouth at  bedtime.  . traZODone (DESYREL) 150 MG tablet Take 150 mg by mouth at bedtime.  Marland Kitchen. venlafaxine (EFFEXOR) 37.5 MG tablet Take 75 mg by mouth daily.   . [DISCONTINUED] diclofenac (VOLTAREN) 75 MG EC tablet 1 tab by mouth twice daily with food as needed for pain    Allergies  Allergen Reactions  . Penicillins Hives and Swelling    Swelling of face Has patient had a PCN reaction causing immediate rash, facial/tongue/throat swelling, SOB or lightheadedness with hypotension: No Has patient had a PCN reaction causing severe rash involving mucus membranes or skin necrosis: Yes Has patient had a PCN reaction that required hospitalization No Has patient had a PCN reaction occurring within the last 10 years: No If all of the above answers are "NO", then may proceed with Cephalosporin use.       Review of Systems     Objective:   Physical Exam  Poor posture, slumped forward. NAD Tender all over back--no specific focus of tenderness.   Neuro:  A & O x 3 , CN, II-XII grossly intact.  Motor 5/5, DTRs 2+/4.  Gait normal Skin:  Well healed scars on low back.  No active vesicular or ulcerative lesions.        Assessment & Plan:  Back pain:  Would like  for him to give PT another month.  To continue with home exercise program.  To stay active with something that does not increase his pain. He is to work on posture via working on Patent examiner. Diazepam 5 mg twice daily as needed for muscle relaxation #30 Increase Gabapentin to 600 mg at bedtime and continue the 300 mg twice daily earlier in the day. See if wife will work on massage. Follow up in 1 month.  If no improvement in 1 month, ortho referral.

## 2016-03-16 ENCOUNTER — Other Ambulatory Visit: Payer: Self-pay

## 2016-03-16 ENCOUNTER — Telehealth: Payer: Self-pay | Admitting: Internal Medicine

## 2016-03-16 DIAGNOSIS — G8921 Chronic pain due to trauma: Secondary | ICD-10-CM

## 2016-03-16 MED ORDER — GABAPENTIN 300 MG PO CAPS: ORAL_CAPSULE | ORAL | 11 refills | Status: DC

## 2016-03-16 NOTE — Telephone Encounter (Signed)
Spoke with patient. Rx re-faxed to pharmacy.

## 2016-03-16 NOTE — Telephone Encounter (Signed)
Patient was told to take 4 pills instead of 3 pills of the Gabapentin and would like to have more pills added to his prescription because he does not think that he will have enough for the whole month with the dosage being bumped up.

## 2016-04-12 ENCOUNTER — Encounter (HOSPITAL_COMMUNITY): Payer: Self-pay

## 2016-04-12 ENCOUNTER — Emergency Department (HOSPITAL_COMMUNITY): Payer: Medicaid Other

## 2016-04-12 ENCOUNTER — Emergency Department (HOSPITAL_COMMUNITY)
Admission: EM | Admit: 2016-04-12 | Discharge: 2016-04-13 | Disposition: A | Discharge status: Home / Self Care | Payer: Medicaid Other | Attending: Emergency Medicine | Admitting: Emergency Medicine

## 2016-04-12 DIAGNOSIS — R4182 Altered mental status, unspecified: Secondary | ICD-10-CM | POA: Diagnosis not present

## 2016-04-12 DIAGNOSIS — F17210 Nicotine dependence, cigarettes, uncomplicated: Secondary | ICD-10-CM | POA: Insufficient documentation

## 2016-04-12 DIAGNOSIS — F209 Schizophrenia, unspecified: Secondary | ICD-10-CM | POA: Diagnosis present

## 2016-04-12 DIAGNOSIS — F4481 Dissociative identity disorder: Secondary | ICD-10-CM | POA: Diagnosis present

## 2016-04-12 DIAGNOSIS — R45851 Suicidal ideations: Secondary | ICD-10-CM | POA: Insufficient documentation

## 2016-04-12 LAB — CBC WITH DIFFERENTIAL/PLATELET
BASOS ABS: 0 10*3/uL (ref 0.0–0.1)
Basophils Relative: 0 %
EOS PCT: 0 %
Eosinophils Absolute: 0 10*3/uL (ref 0.0–0.7)
HEMATOCRIT: 41.7 % (ref 39.0–52.0)
Hemoglobin: 14.7 g/dL (ref 13.0–17.0)
LYMPHS ABS: 1.2 10*3/uL (ref 0.7–4.0)
LYMPHS PCT: 6 %
MCH: 30.6 pg (ref 26.0–34.0)
MCHC: 35.3 g/dL (ref 30.0–36.0)
MCV: 86.9 fL (ref 78.0–100.0)
MONO ABS: 0.9 10*3/uL (ref 0.1–1.0)
Monocytes Relative: 5 %
Neutro Abs: 15.8 10*3/uL — ABNORMAL HIGH (ref 1.7–7.7)
Neutrophils Relative %: 89 %
Platelets: 165 10*3/uL (ref 150–400)
RBC: 4.8 MIL/uL (ref 4.22–5.81)
RDW: 15.5 % (ref 11.5–15.5)
WBC: 17.9 10*3/uL — AB (ref 4.0–10.5)

## 2016-04-12 LAB — COMPREHENSIVE METABOLIC PANEL
ALT: 20 U/L (ref 17–63)
AST: 28 U/L (ref 15–41)
Albumin: 4.1 g/dL (ref 3.5–5.0)
Alkaline Phosphatase: 61 U/L (ref 38–126)
Anion gap: 12 (ref 5–15)
BUN: 12 mg/dL (ref 6–20)
CO2: 23 mmol/L (ref 22–32)
CREATININE: 1.18 mg/dL (ref 0.61–1.24)
Calcium: 9.1 mg/dL (ref 8.9–10.3)
Chloride: 104 mmol/L (ref 101–111)
Glucose, Bld: 86 mg/dL (ref 65–99)
Potassium: 3.9 mmol/L (ref 3.5–5.1)
Sodium: 139 mmol/L (ref 135–145)
TOTAL PROTEIN: 6.9 g/dL (ref 6.5–8.1)
Total Bilirubin: 1.3 mg/dL — ABNORMAL HIGH (ref 0.3–1.2)

## 2016-04-12 LAB — VALPROIC ACID LEVEL: Valproic Acid Lvl: <10 ug/mL — ABNORMAL LOW (ref 50.0–100.0)

## 2016-04-12 LAB — ETHANOL

## 2016-04-12 LAB — ACETAMINOPHEN LEVEL: Acetaminophen (Tylenol), Serum: <10 ug/mL — ABNORMAL LOW (ref 10–30)

## 2016-04-12 LAB — SALICYLATE LEVEL

## 2016-04-12 MED ORDER — HYDROXYZINE HCL 25 MG PO TABS
25.0000 mg | ORAL_TABLET | Freq: Three times a day (TID) | ORAL | Status: DC | PRN
  Administered 2016-04-13: 25 mg via ORAL
  Filled 2016-04-12: qty 1

## 2016-04-12 MED ORDER — NICOTINE 21 MG/24HR TD PT24
21.0000 mg | MEDICATED_PATCH | Freq: Every day | TRANSDERMAL | Status: DC
  Administered 2016-04-12 – 2016-04-13 (×2): 21 mg via TRANSDERMAL
  Filled 2016-04-12 (×2): qty 1

## 2016-04-12 MED ORDER — BENZTROPINE MESYLATE 1 MG PO TABS: 1.0000 mg | ORAL_TABLET | Freq: Two times a day (BID) | ORAL | Status: DC | PRN

## 2016-04-12 MED ORDER — VENLAFAXINE HCL 75 MG PO TABS: 75.0000 mg | ORAL_TABLET | Freq: Every day | ORAL | Status: DC

## 2016-04-12 MED ORDER — ACYCLOVIR 400 MG PO TABS
400.0000 mg | ORAL_TABLET | Freq: Two times a day (BID) | ORAL | Status: DC
  Administered 2016-04-12 – 2016-04-13 (×3): 400 mg via ORAL
  Filled 2016-04-12 (×4): qty 1

## 2016-04-12 MED ORDER — ONDANSETRON HCL 4 MG PO TABS: 4.0000 mg | ORAL_TABLET | Freq: Three times a day (TID) | ORAL | Status: DC | PRN

## 2016-04-12 MED ORDER — IBUPROFEN 200 MG PO TABS: 600.0000 mg | ORAL_TABLET | Freq: Three times a day (TID) | ORAL | Status: DC | PRN

## 2016-04-12 MED ORDER — GABAPENTIN 300 MG PO CAPS
300.0000 mg | ORAL_CAPSULE | Freq: Once | ORAL | Status: AC
  Administered 2016-04-12: 300 mg via ORAL
  Filled 2016-04-12: qty 1

## 2016-04-12 MED ORDER — DIVALPROEX SODIUM 250 MG PO DR TAB
1000.0000 mg | DELAYED_RELEASE_TABLET | Freq: Every morning | ORAL | Status: DC
  Administered 2016-04-12: 500 mg via ORAL
  Administered 2016-04-13: 1000 mg via ORAL
  Filled 2016-04-12 (×2): qty 4

## 2016-04-12 MED ORDER — DIVALPROEX SODIUM 250 MG PO DR TAB
500.0000 mg | DELAYED_RELEASE_TABLET | Freq: Once | ORAL | Status: AC
  Administered 2016-04-12: 500 mg via ORAL
  Filled 2016-04-12: qty 2

## 2016-04-12 MED ORDER — VENLAFAXINE HCL ER 75 MG PO CP24
75.0000 mg | ORAL_CAPSULE | Freq: Every day | ORAL | Status: DC
  Administered 2016-04-12 – 2016-04-13 (×2): 75 mg via ORAL
  Filled 2016-04-12 (×4): qty 1

## 2016-04-12 MED ORDER — ACETAMINOPHEN 325 MG PO TABS: 650.0000 mg | ORAL_TABLET | ORAL | Status: DC | PRN

## 2016-04-12 MED ORDER — LURASIDONE HCL 80 MG PO TABS
80.0000 mg | ORAL_TABLET | Freq: Every day | ORAL | Status: DC
  Administered 2016-04-12: 80 mg via ORAL
  Filled 2016-04-12 (×2): qty 1

## 2016-04-12 MED ORDER — ALUM & MAG HYDROXIDE-SIMETH 200-200-20 MG/5ML PO SUSP: 30.0000 mL | ORAL | Status: DC | PRN

## 2016-04-12 MED ORDER — LORAZEPAM 1 MG PO TABS
1.0000 mg | ORAL_TABLET | Freq: Three times a day (TID) | ORAL | Status: DC | PRN
  Administered 2016-04-13: 1 mg via ORAL
  Filled 2016-04-12: qty 1

## 2016-04-12 MED ORDER — ZOLPIDEM TARTRATE 5 MG PO TABS: 5.0000 mg | ORAL_TABLET | Freq: Every evening | ORAL | Status: DC | PRN

## 2016-04-12 MED ORDER — TRAZODONE HCL 50 MG PO TABS
150.0000 mg | ORAL_TABLET | Freq: Every day | ORAL | Status: DC
  Administered 2016-04-12: 150 mg via ORAL
  Filled 2016-04-12: qty 3

## 2016-04-12 MED ORDER — DIAZEPAM 5 MG PO TABS
5.0000 mg | ORAL_TABLET | Freq: Two times a day (BID) | ORAL | Status: DC | PRN
  Administered 2016-04-12: 5 mg via ORAL
  Filled 2016-04-12: qty 1

## 2016-04-12 MED ORDER — GABAPENTIN 300 MG PO CAPS
300.0000 mg | ORAL_CAPSULE | Freq: Three times a day (TID) | ORAL | Status: DC
  Administered 2016-04-12 – 2016-04-13 (×4): 300 mg via ORAL
  Filled 2016-04-12 (×4): qty 1

## 2016-04-12 NOTE — ED Notes (Signed)
Pt belongings at nursing station with pt label on bags (2 bags). Pt wife reports she will take belongings with her when she leaves

## 2016-04-12 NOTE — ED Notes (Signed)
Pt's wife at bedside.  States he's had episodes like this before. He has been under a lot of stress b/c his father has cancer. Also, she had a sleep study last night and she was not with him, so she feels this is what triggered this behavior.

## 2016-04-12 NOTE — BH Assessment (Signed)
Tele Assessment Note    Danny Lester is an 41 y.o. male. Pt was found laying in oncoming traffic reporting SI. Pt was found catatonic. Pt has a history of black-outs and losing hours/days. Pt denies HI. Pt reports AVH. 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 Pt states one of his alternates is consistently SI. Pt is seen at [facility]. Pt is seeking an ACTT team but does not qualify due to DID diagnosis. Pt is currently prescribed Latuda, Depakote, Effexor, Hydrozyzine, Valium, Gabbapentin, and Trazodone. Pt reports previous SI attempts. 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   Writer consulted with Jacki Cones, NP. Per Jacki Cones Pt meets inpatient criteria. TTS to seek placement.  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 Anxiety   . Depression   . Multiple personality disorder   . Nerve pain   . PTSD (post-traumatic stress disorder)   . Schizophrenia, paranoid, chronic (HCC)     Past Surgical History:  Procedure Laterality Date  . KNEE SURGERY      Family History: No family history on file.  Social History:  reports that he has been smoking Cigarettes.  He has been smoking about 0.30 packs per day. He has never used smokeless tobacco. He reports that he drinks alcohol. He reports that he does not use drugs.  Additional Social History:  Alcohol / Drug Use Pain Medications: please see mar Prescriptions: please see mar Over the Counter: please see mar History of alcohol / drug use?: Yes Longest period of sobriety (when/how long): 16 years Substance #1 Name of Substance 1: cocaine 1 - Age of First Use: unknown 1 - Amount (size/oz): unknown 1 - Frequency: Pt reports relaspe yesterday 1 - Duration: ongoing 1 - Last Use / Amount: 04/11/16  CIWA: CIWA-Ar BP: 112/68 Pulse Rate: (!) 59 COWS:    PATIENT STRENGTHS: (choose at least two) Average or above  average intelligence Communication skills  Allergies:  Allergies  Allergen Reactions  . Penicillins Hives and Swelling    Swelling of face Has patient had a PCN reaction causing immediate rash, facial/tongue/throat swelling, SOB or lightheadedness with hypotension: No Has patient had a PCN reaction causing severe rash involving mucus membranes or skin necrosis: Yes Has patient had a PCN reaction that required hospitalization No Has patient had a PCN reaction occurring within the last 10 years: No If all of the above answers are "NO", then may proceed with Cephalosporin use.     Home Medications:  (Not in a hospital admission)  OB/GYN Status:  No LMP for male patient.  General Assessment Data Location of Assessment: [facility] ED TTS Assessment: In system Is this a Tele or Face-to-Face Assessment?: Tele Assessment Is this an Initial Assessment or a Re-assessment for this encounter?: Initial Assessment Marital status: Married [place] name: NA Is patient pregnant?: No Pregnancy Status: No Living Arrangements: Spouse/significant other Can pt return to current living arrangement?: Yes Admission Status: Involuntary Is patient capable of signing voluntary admission?: No Referral Source: Self/Family/Friend Insurance type: Medicaid     Crisis Care Plan Living Arrangements: Spouse/significant other Legal Guardian: Other: (self) Name of Psychiatrist: Transport planner Name of Therapist: [facility]  Education Status Is patient currently in school?: No Current Grade: NA Highest grade of school patient has completed: GED Name of school: NA Contact person: NA  Risk to self with the past 6 months Suicidal Ideation: Yes-Currently Present Suicidal Intent: Yes-Currently  Present Has patient had any suicidal intent within the past 6 months prior to admission? : Yes Is patient at risk for suicide?: Yes Suicidal Plan?: Yes-Currently Present Has patient had any suicidal plan within the past 6 months  prior to admission? : Yes Specify Current Suicidal Plan: hit by traffic Access to Means: Yes Specify Access to Suicidal Means: access to traffic What has been your use of drugs/alcohol within the last 12 months?: crack cocaine Previous Attempts/Gestures: Yes How many times?: 3 Other Self Harm Risks: SA Triggers for Past Attempts: Unknown Intentional Self Injurious Behavior: None Family Suicide History: No Recent stressful life event(s): Other (Comment) (family issues) Persecutory voices/beliefs?: Yes Depression: Yes Depression Symptoms: Tearfulness, Despondent, Loss of interest in usual pleasures, Feeling angry/irritable, Feeling worthless/self pity, Guilt, Fatigue, Isolating, Insomnia Substance abuse history and/or treatment for substance abuse?: Yes Suicide prevention information given to non-admitted patients: Not applicable  Risk to Others within the past 6 months Homicidal Ideation: No Does patient have any lifetime risk of violence toward others beyond the six months prior to admission? : No Thoughts of Harm to Others: No Current Homicidal Intent: No Current Homicidal Plan: No Access to Homicidal Means: No Identified Victim: NA History of harm to others?: No Assessment of Violence: None Noted Violent Behavior Description: NA Does patient have access to weapons?: No Criminal Charges Pending?: No Does patient have a court date: No Is patient on probation?: No  Psychosis Hallucinations: Auditory, Visual Delusions: Persecutory  Mental Status Report Appearance/Hygiene: Bizarre Eye Contact: Poor Motor Activity: Freedom of movement Speech: Logical/coherent Level of Consciousness: Alert Mood: Depressed Affect: Depressed Anxiety Level: Minimal Thought Processes: Relevant Judgement: Impaired Orientation: Person, Place, Time, Situation Obsessive Compulsive Thoughts/Behaviors: None  Cognitive Functioning Concentration: Normal Memory: Recent Intact, Remote Intact IQ:  Average Insight: Poor Impulse Control: Poor Appetite: Poor Weight Loss: 0 Weight Gain: 0 Sleep: Decreased Total Hours of Sleep: 5 Vegetative Symptoms: None  ADLScreening [facility] Assessment Services) Patient's cognitive ability adequate to safely complete daily activities?: Yes Patient able to express need for assistance with ADLs?: Yes Independently performs ADLs?: Yes (appropriate for developmental age)  Prior Inpatient Therapy Prior Inpatient Therapy: Yes Prior Therapy Dates: 2017 Prior Therapy Facilty/Provider(s): [facility] Reason for Treatment: schizoprhenia  Prior Outpatient Therapy Prior Outpatient Therapy: Yes Prior Therapy Dates: current Prior Therapy Facilty/Provider(s):  [facility] Reason for Treatment: schizoprhenia Does patient have an ACCT team?: No Does patient have Intensive In-House Services?  : No Does patient have [facility] services? : Yes Does patient have P4CC services?: No  ADL Screening (condition at time of admission) Patient's cognitive ability adequate to safely complete daily activities?: Yes Is the patient deaf or have difficulty hearing?: No Does the patient have difficulty seeing, even when wearing glasses/contacts?: No Patient able to express need for assistance with ADLs?: Yes Does the patient have difficulty dressing or bathing?: No Independently performs ADLs?: Yes (appropriate for developmental age) Does the patient have difficulty walking or climbing stairs?: No Weakness of Legs: None Weakness of Arms/Hands: None       Abuse/Neglect Assessment (Assessment to be complete while patient is alone) Physical Abuse: Denies Verbal Abuse: Denies Sexual Abuse: Denies Exploitation of patient/patient's resources: Denies Self-Neglect: Denies     Merchant navy officer (For Healthcare) Does Patient Have a Medical Advance Directive?: No    Additional Information 1:1 In Past 12 Months?: No CIRT Risk: No Elopement Risk: No Does patient have medical  clearance?: Yes     Disposition:  Disposition Initial Assessment Completed for this Encounter:  Yes  Kam Rahimi D 04/12/2016 10:14 AM

## 2016-04-12 NOTE — ED Notes (Signed)
Pt is speaking to his wife, though not staff.  She states he is telling her he wants to die.  He also told her he has relapsed and started taking drugs 3 days ago.

## 2016-04-12 NOTE — ED Provider Notes (Signed)
Patient left the ER without discharge. He was tracked down by police/security and brought back as we have involuntary committed him. More information is coming from the girlfriend that he has been trying to kill himself and wanting to kill himself. He was laying in the middle of the road this morning to kill himself. This point he is not stable for discharge. He actually wants help and was going to go to [place]. However he is high risk for suicide and I do not think he can be reliably discharged to make these components on his own. TTS consulted   Pricilla Loveless, MD 04/12/16 [phone]

## 2016-04-12 NOTE — ED Notes (Signed)
Pt responds to ammonia ampule

## 2016-04-12 NOTE — ED Notes (Addendum)
Pt pacing the floor, throwing papers, becoming more agitated.  Pt refuses to take anxiety medications.

## 2016-04-12 NOTE — ED Provider Notes (Signed)
[facility]-EMERGENCY DEPT Provider Note   CSN: [id number] Arrival date & time: 04/12/16  4098     History   Chief Complaint Chief Complaint  Patient presents with  . Altered Mental Status    HPI Jacub Denham is a 41 y.o. male.  The history is provided by the EMS personnel.  Altered Mental Status      Level V caveat:  Non-verbal 41 year old male with history of anxiety, depression, multiple personality disorder, PTSD, schizophrenia, presenting to the ED with altered mental status. Per EMS patient was found lying in the road. His eyes were open but he refuses to speak or move. He arrives without any signs of trauma but in c-collar and on long spine board.  Patient does not provide any history.  Past Medical History:  Diagnosis Date  . Anxiety   . Depression   . Multiple personality disorder   . Nerve pain   . PTSD (post-traumatic stress disorder)   . Schizophrenia, paranoid, chronic [facility])     Patient Active Problem List   Diagnosis Date Noted  . Herpes simplex 02/11/2016  . Dehydration 12/01/2015  . Leukocytosis 12/01/2015  . Schizophrenia (HCC) 12/01/2015  . 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 Carpal tunnel syndrome, bilateral 09/28/2015  . Neck pain 09/28/2015  . Low back pain 09/28/2015  . Plantar fasciitis, bilateral 09/28/2015  . Dental decay 09/28/2015    Past Surgical History:  Procedure Laterality Date  . KNEE SURGERY         Home Medications    Prior to Admission medications   Medication Sig Start Date End Date Taking? Authorizing Provider  acyclovir (ZOVIRAX) 400 MG tablet 1 tab by mouth twice daily 02/11/16   Julieanne Manson, MD  benztropine (COGENTIN) 1 MG tablet Take 1 tablet (1 mg total) by mouth 2 (two) times daily as needed for tremors. 09/03/15   Lorre Nick, MD  diazepam (VALIUM) 5 MG tablet 1 tab by mouth twice daily for muscle pain 03/14/16   Julieanne Manson, MD  divalproex (DEPAKOTE) 500 MG DR tablet Take 500 mg by mouth. 2 in the  am and 1 in the pm    Historical Provider, MD  famotidine (PEPCID) 20 MG tablet Take 1 tablet (20 mg total) by mouth 2 (two) times daily. Patient not taking: Reported on 12/01/2015 07/18/15   Trixie Dredge, PA-C  gabapentin (NEURONTIN) 300 MG capsule 1 cap by mouth in the morning, 1 cap midday and 2 caps at bedtime. 03/16/16   Julieanne Manson, MD  hydrOXYzine (ATARAX/VISTARIL) 25 MG tablet Take 25 mg by mouth 3 (three) times daily as needed.    Historical Provider, MD  lurasidone (LATUDA) 80 MG TABS tablet Take 80 mg by mouth at bedtime.    Historical Provider, MD  traZODone (DESYREL) 150 MG tablet Take 150 mg by mouth at bedtime.    Historical Provider, MD  venlafaxine (EFFEXOR) 37.5 MG tablet Take 75 mg by mouth daily.     Historical Provider, MD    Family History No family history on file.  Social History Social History  Substance Use Topics  . Smoking status: Current Some Day Smoker    Packs/day: 0.30    Types: Cigarettes  . Smokeless tobacco: Never Used  . Alcohol use Yes     Comment: beer occasionally     Allergies   Penicillins   Review of Systems Review of Systems  Unable to perform ROS: Other     Physical Exam Updated Vital Signs BP 127/66  Pulse 83   Temp 98.6 F (37 C)   Resp 20   SpO2 99%   Physical Exam  Constitutional: He appears well-developed and well-nourished.  HENT:  Head: Normocephalic and atraumatic.  Mouth/Throat: Oropharynx is clear and moist.  Eyes: Conjunctivae and EOM are normal. Pupils are equal, round, and reactive to light.  Not blinking spontaneously but does blink to rapid movements in front of the eyes  Neck: Normal range of motion.  Cardiovascular: Normal rate, regular rhythm and normal heart sounds.   Pulmonary/Chest: Effort normal and breath sounds normal.  Abdominal: Soft. Bowel sounds are normal.  Musculoskeletal:  Moves all 4 extremities with ammonia tablet and firm pressure into the back  Neurological:  Appears awake but  refuses to respond to verbal stimuli, he does respond with movement of all 4 extremities with ammonia tablet or deep pressure applied to the back; he has downgoing plantar reflexes; when lifting arms and drops he guards to avoid his face or has controlled fall downwards to his side  Skin: Skin is warm and dry.  Psychiatric: He has a normal mood and affect.  Nursing note and vitals reviewed.    ED Treatments / Results  Labs (all labs ordered are listed, but only abnormal results are displayed) Labs Reviewed  CBC WITH DIFFERENTIAL/PLATELET - Abnormal; Notable for the following:       Result Value   WBC 17.9 (*)    Neutro Abs 15.8 (*)    All other components within normal limits  COMPREHENSIVE METABOLIC PANEL - Abnormal; Notable for the following:    Total Bilirubin 1.3 (*)    All other components within normal limits  ACETAMINOPHEN LEVEL - Abnormal; Notable for the following:    Acetaminophen (Tylenol), Serum <10 (*)    All other components within normal limits  VALPROIC ACID LEVEL - Abnormal; Notable for the following:    Valproic Acid Lvl <10 (*)    All other components within normal limits  ETHANOL  SALICYLATE LEVEL  RAPID URINE DRUG SCREEN, HOSP PERFORMED    EKG  EKG Interpretation None       Radiology Ct Head Wo Contrast  Result Date: 04/12/2016 CLINICAL DATA:  Altered mental status EXAM: CT HEAD WITHOUT CONTRAST CT CERVICAL SPINE WITHOUT CONTRAST TECHNIQUE: Multidetector CT imaging of the head and cervical spine was performed following the standard protocol without intravenous contrast. Multiplanar CT image reconstructions of the cervical spine were also generated. COMPARISON:  CT head 12/02/2015 FINDINGS: CT HEAD FINDINGS Brain: No evidence of acute infarction, hemorrhage, hydrocephalus, extra-axial collection or mass lesion/mass effect. Vascular: No hyperdense vessel or unexpected calcification. Skull: Negative Sinuses/Orbits: Chronic fracture right medial orbit.  Remaining sinuses clear. Normal orbital contents. Other: None CT CERVICAL SPINE FINDINGS Alignment: Normal Skull base and vertebrae: Negative for fracture. No focal bony lesion Soft tissues and spinal canal: Negative Disc levels:  No significant degenerative change or spinal stenosis. Upper chest: Small subpleural blebs bilaterally. Other: None IMPRESSION: Negative CT of the head. Chronic fracture right medial orbit Negative CT cervical spine Electronically Signed   By: Marlan Palau M.D.   On: 04/12/2016 07:30   Ct Cervical Spine Wo Contrast  Result Date: 04/12/2016 CLINICAL DATA:  Altered mental status EXAM: CT HEAD WITHOUT CONTRAST CT CERVICAL SPINE WITHOUT CONTRAST TECHNIQUE: Multidetector CT imaging of the head and cervical spine was performed following the standard protocol without intravenous contrast. Multiplanar CT image reconstructions of the cervical spine were also generated. COMPARISON:  CT head 12/02/2015 FINDINGS: CT  HEAD FINDINGS Brain: No evidence of acute infarction, hemorrhage, hydrocephalus, extra-axial collection or mass lesion/mass effect. Vascular: No hyperdense vessel or unexpected calcification. Skull: Negative Sinuses/Orbits: Chronic fracture right medial orbit. Remaining sinuses clear. Normal orbital contents. Other: None CT CERVICAL SPINE FINDINGS Alignment: Normal Skull base and vertebrae: Negative for fracture. No focal bony lesion Soft tissues and spinal canal: Negative Disc levels:  No significant degenerative change or spinal stenosis. Upper chest: Small subpleural blebs bilaterally. Other: None IMPRESSION: Negative CT of the head. Chronic fracture right medial orbit Negative CT cervical spine Electronically Signed   By: Marlan Palau M.D.   On: 04/12/2016 07:30    Procedures Procedures (including critical care time)  Medications Ordered in ED Medications - No data to display   Initial Impression / Assessment and Plan / ED Course  I have reviewed the triage vital  signs and the nursing notes.  Pertinent labs & imaging results that were available during my care of the patient were reviewed by me and considered in my medical decision making (see chart for details).  41 year old male here with altered mental status. He was found by EMS lying in the middle of the road. He has not been moving and is refusing to answer questions. He is awake on my exam. He is not spontaneously moving, however he does respond to ammonia tablet and moves all 4 extremities. Does blink with rapid movements in front of the eyes.  When arm is dropped, he guards his body to avoid hitting himself in the face, controlled fall into the bedside.  Does not appear truly catatonic.  He was cleared from the long spine board, no evidence of vertebral fracture or apparent spinal cord injury.  Patient does not have long psychiatric history, suspect this may be playing a role.  Will plan for CT head/CS, labs.    8:37 AM Patient reassessed, wife now at bedside. Patient is more awake and talking to her. He does track me around the room and responds to questioning.  Wife does report that he has been under increased stress recently. She reports he has been having some family and financial issues. Notably, he just found out that they were cutting his social security check to around $100 a month and neither of them are working currently so that is causing some strain. She reports he relapsed on cocaine about a week ago after being clean for several years.  Wife states she knew something was wrong when he was not home this morning.  She was at a sleep study and got a call from EMS stating he was at the hospital.  Wife states she knows he needs inpatient treatment as he is unstable currently.  She is agreeable to psychiatric consultation and possible placement.  9:21 AM Patient and wife attempted to leave facility.  He was IVC'd.  Psychiatric evaluation pending.  TTS counselor has evaluated patient, however no  disposition determined as of yet.  Patient has holding orders in place.   I have ordered his home medications.  Final Clinical Impressions(s) / ED Diagnoses   Final diagnoses:  Suicidal ideation    New Prescriptions New Prescriptions   No medications on file     Garlon Hatchet, PA-C 04/12/16 1459    Shon Baton, MD 04/13/16 [phone]

## 2016-04-12 NOTE — ED Notes (Signed)
Pt speaking with TTS 

## 2016-04-12 NOTE — Progress Notes (Signed)
Pt meets criteria for inpatient admission; CSW faxed referral packet to the following facilities in attempts to secure inpatient bed:   [facility]  TTS will continue to seek placement.   Vernie Shanks, LCSW Clinical Social Work [phone]

## 2016-04-12 NOTE — ED Triage Notes (Signed)
Pt comes via [facility], pt was found lying in the road, pt is alert with eye open, will not speak, or move.

## 2016-04-12 NOTE — ED Notes (Signed)
Pt also informed me that he takes  depakote in am and  depakote at night. Dr. Rosalia Hammers informed and additional order received.

## 2016-04-12 NOTE — ED Notes (Signed)
Patient transported to CT 

## 2016-04-12 NOTE — ED Notes (Signed)
PT reports he takes  of gabapentin in am and at lunch, but  at bedtime. Dr. Rosalia Hammers notified and additional order giver for  gabapentin

## 2016-04-13 DIAGNOSIS — F209 Schizophrenia, unspecified: Secondary | ICD-10-CM

## 2016-04-13 DIAGNOSIS — Z79899 Other long term (current) drug therapy: Secondary | ICD-10-CM

## 2016-04-13 DIAGNOSIS — F17210 Nicotine dependence, cigarettes, uncomplicated: Secondary | ICD-10-CM

## 2016-04-13 NOTE — ED Notes (Addendum)
Pt was becoming increasing agitated per sitter, requesting to see a psychiatrist.  This RN explained in great detail the process and when he would likely be able to speak with a psychiatrist.  Pt was also upset about not taking a shower or being able to brush his teeth for 2 days.  This RN offered a shower and gathered supplies for him to take one.  Pt also offered snacks but declined.  Pt started calming down after conversation.  Pt escorted to shower, full liens changes, and room straightened.  Pt calm at this time.

## 2016-04-13 NOTE — ED Notes (Signed)
Pt is being re-evaluated by Kandace Blitz, NP by TTS.

## 2016-04-13 NOTE — Progress Notes (Signed)
CSW spoke with Pt's significant other who expresses agreement with disposition plan of discharge.  She reports that Pt has been doing better and feels that he will be safe to return home and continue with outpatient treatment.   Vernie Shanks, LCSW Clinical Social Work [phone]

## 2016-04-13 NOTE — ED Notes (Signed)
Pt had tts re-evaluation.

## 2016-04-13 NOTE — [facility] Counselor (Signed)
Pt denies SI/HI and AVH.  Pt has concerns about when he will be placed. This writer explained the placement process to the Pt.   Pt will be re-evaluated by Jacki Cones, NP.   Wolfgang Phoenix, [facility] Triage Specialist

## 2016-04-13 NOTE — ED Notes (Signed)
Wife - [phone]

## 2016-04-13 NOTE — ED Notes (Signed)
Lunch tray ordered 

## 2016-04-13 NOTE — ED Notes (Signed)
Pts wife has been asleep at bedside all night. This RN informed pt and wife of visitation hours and the plan to move him to a different area of the ED as soon as a room is available, both were given Pod F information sheet. Pt's wife stated she was told she could stay due to assistance in helping to keep him calm. This RN advised the charge RN had already stated it was time for her to leave and she could come back during visitation hours. Notified Crecencio Mc, Consulting civil engineer that pt's wife was requesting to speak with her and was not leaving at this time.
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
## 2016-04-13 NOTE — ED Notes (Signed)
Pt received all belongings.

## 2016-04-13 NOTE — ED Notes (Signed)
Pt's wife has left the unit with plans to return at lunch time visiting hours.  She advised that if pt starts mentioning demons he is probably escalating.

## 2016-04-14 ENCOUNTER — Encounter: Payer: Self-pay | Admitting: Internal Medicine

## 2016-04-14 ENCOUNTER — Ambulatory Visit (INDEPENDENT_AMBULATORY_CARE_PROVIDER_SITE_OTHER): Payer: Medicaid Other | Admitting: Internal Medicine

## 2016-04-14 DIAGNOSIS — G8921 Chronic pain due to trauma: Secondary | ICD-10-CM

## 2016-04-14 MED ORDER — BACLOFEN 10 MG PO TABS: ORAL_TABLET | ORAL | 1 refills | Status: DC

## 2016-04-14 MED ORDER — GABAPENTIN 300 MG PO CAPS: ORAL_CAPSULE | ORAL | 4 refills | Status: DC

## 2016-04-14 NOTE — Patient Instructions (Addendum)
Continue with PT Do not increase dose of Baclofen --only 1/2 tab twice daily  With Gabapentin:  Take 2 caps in the morning starting tomorrow, 1 cap midday, and 2 caps at bedtime.  On Tuesday, increase to 2 caps 3 times daily and stay on that dose.  If you are doing well with that level when you return, we will switch you over to 600 mg caps so you will have fewer pills.

## 2016-04-14 NOTE — Progress Notes (Signed)
   Subjective:    Patient ID: Danny Lester, male    DOB: 06/09/1975, 41 y.o.   MRN: [id number]  HPI   Had hospitalization earlier this week for mental health issues.  Used Cocaine after being clean for some time.    Chronic Back Pain:  Has been going to PT twice weekly.  Has missed about 3 visits due to a hospitalization with Behavioral Health.  Is going to [facility].   Diazepam did not really help. At gabapentin 600 mg at bedtime and 300 mg twice daily earlier in day.   Spoke with another woman with similar complaints who has tried Baclofen.   He feels better if he has heat on his back at PT.    Current Meds  Medication Sig  . acyclovir (ZOVIRAX) 400 MG tablet 1 tab by mouth twice daily  . divalproex (DEPAKOTE) 500 MG DR tablet Take 500 mg by mouth. 2 in the am and 1 in the pm  . gabapentin (NEURONTIN) 300 MG capsule 1 cap by mouth in the morning, 1 cap midday and 2 caps at bedtime.  . hydrOXYzine (ATARAX/VISTARIL) 25 MG tablet Take 25 mg by mouth 3 (three) times daily as needed.  . lurasidone (LATUDA) 80 MG TABS tablet Take 80 mg by mouth at bedtime.  . traZODone (DESYREL) 150 MG tablet Take 150 mg by mouth at bedtime.  Marland Kitchen venlafaxine XR (EFFEXOR-XR) 75 MG 24 hr capsule Take 75 mg by mouth every morning.  . [DISCONTINUED] diazepam (VALIUM) 5 MG tablet 1 tab by mouth twice daily for muscle pain    Allergies  Allergen Reactions  . Penicillins Hives and Swelling    Swelling of face Has patient had a PCN reaction causing immediate rash, facial/tongue/throat swelling, SOB or lightheadedness with hypotension: No Has patient had a PCN reaction causing severe rash involving mucus membranes or skin necrosis: Yes Has patient had a PCN reaction that required hospitalization No Has patient had a PCN reaction occurring within the last 10 years: No If all of the above answers are "NO", then may proceed with Cephalosporin use.       Review of Systems     Objective:   Physical Exam  Lungs:  CTA CV:  RRR without murmur or rub, radial pulses normal and equal Back:  Tender everywhere palpate back.  Moves easily on and off exam table. Neuro:  A & O x 3, CN II-XII grossly intact, DTRs 2+/4 throughout Motor 5/5, sensory grossly normal       Assessment & Plan:  Chronic Back Pain:  Continue Gabapentin, but titrate to 600 mg three times daily Trial of Baclofen for when back particularly bothers him.   Continue PT Follow up in 2 months  Mental Health:  Followed by [facility]er.  Recent hospitalization.

## 2016-05-31 ENCOUNTER — Ambulatory Visit: Payer: Medicaid Other | Admitting: Internal Medicine

## 2016-06-13 ENCOUNTER — Ambulatory Visit (INDEPENDENT_AMBULATORY_CARE_PROVIDER_SITE_OTHER): Payer: Medicaid Other | Admitting: Internal Medicine

## 2016-06-13 ENCOUNTER — Encounter: Payer: Self-pay | Admitting: Internal Medicine

## 2016-06-13 VITALS — BP 112/68 | HR 60 | Resp 12 | Ht 67.0 in | Wt 169.0 lb

## 2016-06-13 DIAGNOSIS — M542 Cervicalgia: Secondary | ICD-10-CM

## 2016-06-13 DIAGNOSIS — M545 Low back pain, unspecified: Secondary | ICD-10-CM

## 2016-06-13 DIAGNOSIS — F200 Paranoid schizophrenia: Secondary | ICD-10-CM

## 2016-06-13 MED ORDER — GABAPENTIN 600 MG PO TABS: 600.0000 mg | ORAL_TABLET | Freq: Three times a day (TID) | ORAL | 11 refills | Status: DC

## 2016-06-13 MED ORDER — BACLOFEN 10 MG PO TABS: ORAL_TABLET | ORAL | 3 refills | Status: DC

## 2016-06-13 NOTE — Progress Notes (Signed)
Subjective:    Patient ID: Danny Lester, male    DOB: 10/29/1975, 41 y.o.   MRN: [id number]  HPI   Chronic Back Pain:  Last week, went to [facility] and developed headache. Apparently, felt that the PT is not helping him particularly.   He does have a home exercise program they have started him with and he does that without much pain.   Missed some intervening visits as his father died 05/18/2024and had to travel to be with family.  Feels he went twice weekly since beginning of April until his father started getting more ill.  Is taking 600 mg Gabapentin three times daily and Baclofen 5 mg twice daily as needed.  Feels this combination has helped.  If he did not have the increased pain after PT, he feels he would be doing better.  Uses the Baclofen mainly on the days of or after PT.    Mental Health:  [name], had an episode where he just got in the car and started driving around--was [place]. Did not even know how he got there.  Loss of his father, decreased funds as going down to see father.   Moving to [facility], which is stressful.    Was tired of his problems and cut his wrist superficially with a razor.  Left wrist.  Admits he had stopped his meds for a day and a half before this episode yesterday.   Taking meds today.  Feels much better.  Not thinking of harming himself.  Current Meds  Medication Sig  . acyclovir (ZOVIRAX) 400 MG tablet 1 tab by mouth twice daily  . divalproex (DEPAKOTE) 500 MG DR tablet Take 500 mg by mouth. 2 in the am and 1 in the pm  . gabapentin (NEURONTIN) 300 MG capsule 2 caps by mouth three times daily  . hydrOXYzine (ATARAX/VISTARIL) 25 MG tablet Take 25 mg by mouth 3 (three) times daily as needed.  . lurasidone (LATUDA) 80 MG TABS tablet Take 80 mg by mouth at bedtime.  . traZODone (DESYREL) 150 MG tablet Take 150 mg by mouth at bedtime.  Marland Kitchen venlafaxine (EFFEXOR) 37.5 MG tablet Take 37.5 mg by mouth 3 (three) times daily  with meals.  . [DISCONTINUED] venlafaxine XR (EFFEXOR-XR) 75 MG 24 hr capsule Take 75 mg by mouth every morning.   Allergies  Allergen Reactions  . Penicillins Hives and Swelling    Swelling of face Has patient had a PCN reaction causing immediate rash, facial/tongue/throat swelling, SOB or lightheadedness with hypotension: No Has patient had a PCN reaction causing severe rash involving mucus membranes or skin necrosis: Yes Has patient had a PCN reaction that required hospitalization No Has patient had a PCN reaction occurring within the last 10 years: No If all of the above answers are "NO", then may proceed with Cephalosporin use.          Review of Systems     Objective:   Physical Exam   NAD Lungs:  CTA CV:  RRR without murmur or rub, radial pulses normal and equal Neck and Back:  Tender over traps bilaterally to shoulders.  Mild tenderness down entire paraspinous musculature of back        Assessment & Plan:  1.  Back and neck pain:  Stop PT, to perform only his regular exercise program at home.  Use Baclofen sparingly when has more pain. Change Gabapentin to 600 mg tabs three times daily  2.  Mental Health:  Call into [facility], his counselor:  Left message regarding concerns from yesterday.  Patient is not actively suicidal and knows how to contact mental health system should he become so. Mr. Gerre PebblesGarrett did contact me back later after speaking with patient and also felt he was not actively suicidal and would be fine keeping his upcoming appointments later in week.

## 2016-06-19 ENCOUNTER — Encounter: Payer: Self-pay | Admitting: Internal Medicine

## 2016-06-19 ENCOUNTER — Ambulatory Visit (INDEPENDENT_AMBULATORY_CARE_PROVIDER_SITE_OTHER): Payer: Medicaid Other | Admitting: Internal Medicine

## 2016-06-19 VITALS — BP 102/80 | HR 80 | Resp 12 | Ht 67.0 in | Wt 173.0 lb

## 2016-06-19 DIAGNOSIS — M545 Low back pain, unspecified: Secondary | ICD-10-CM

## 2016-06-19 DIAGNOSIS — M542 Cervicalgia: Secondary | ICD-10-CM | POA: Diagnosis not present

## 2016-06-19 MED ORDER — DICLOFENAC SODIUM 3 % TD GEL: TRANSDERMAL | 6 refills | Status: DC
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
## 2016-06-19 NOTE — Patient Instructions (Signed)
May take 1 whole tab of Baclofen at bedtime while pain is worse.

## 2016-06-21 ENCOUNTER — Telehealth: Payer: Self-pay | Admitting: Internal Medicine

## 2016-06-21 NOTE — Telephone Encounter (Signed)
Patient's attorney needs copy of referral and physician name, that patient was referred to for pain management.  Attorney's office representative  is Ms. Angelina PihDeborah Williams and can be reached at [phone].  There is a Release of Information to this attorney's office on file in patient's chart.

## 2016-06-21 NOTE — Telephone Encounter (Signed)
[facility]

## 2016-08-01 ENCOUNTER — Telehealth: Payer: Self-pay | Admitting: Internal Medicine

## 2016-08-01 NOTE — Telephone Encounter (Signed)
ERROR

## 2016-08-23 ENCOUNTER — Encounter: Payer: Self-pay | Admitting: Internal Medicine

## 2016-08-23 ENCOUNTER — Ambulatory Visit (INDEPENDENT_AMBULATORY_CARE_PROVIDER_SITE_OTHER): Payer: Medicaid Other | Admitting: Internal Medicine

## 2016-08-23 VITALS — BP 118/72 | HR 92 | Resp 12 | Ht 67.0 in | Wt 169.0 lb

## 2016-08-23 DIAGNOSIS — M545 Low back pain, unspecified: Secondary | ICD-10-CM

## 2016-08-23 MED ORDER — GABAPENTIN 600 MG PO TABS: ORAL_TABLET | ORAL | 11 refills | Status: DC

## 2016-08-23 NOTE — Progress Notes (Signed)
   Subjective:    Patient ID: Danny Lester, male    DOB: Sep 09, 1975, 41 y.o.   MRN: [id number]  HPI   Chronic Back Pain:  Is going to [facility]--did not see Dr. Ethelene Halamos, but perhaps a Dr. Shon BatonBrooks.   Sounds like he had an MRI in July and was to see another physician for what sounds like an epidural injection.  Subsequent to injection on 9/28, he is to restart PT with another facility--not clear where or if with [facility].   He states his medications for pain have not been changed.    Continues with the Gabapentin 600 mg three times daily.  He is interested in increasing the Gabapentin at this point or switching to Lyrica to see if the latter works better for him.  States he at one time took 900 mg of Gabapentin 3 times daily before left prison.    Received records from Dr. Venita Lickahari Brooks [facility].  MRI shows severe right neural foraminal narrowing with encroachment on right L5 dorsal root ganglion.  At L4-5, mild disc bulge with mild bilateral neural foraminal narrowing.  Straightening of lumbar lordosis noted as well.  Current Meds  Medication Sig  . acyclovir (ZOVIRAX) 400 MG tablet 1 tab by mouth twice daily  . baclofen (LIORESAL) 10 MG tablet 1/2 tab by mouth twice daily only as needed (Patient taking differently: 10 mg 2 (two) times daily. )  . Diclofenac Sodium 3 % GEL Apply twice daily to affected areas  . divalproex (DEPAKOTE) 500 MG DR tablet Take 500 mg by mouth. 2 in the am and 1 in the pm  . gabapentin (NEURONTIN) 600 MG tablet Take 1 tablet (600 mg total) by mouth 3 (three) times daily.  . hydrOXYzine (ATARAX/VISTARIL) 25 MG tablet Take 25 mg by mouth 3 (three) times daily as needed.  . lurasidone (LATUDA) 80 MG TABS tablet Take 80 mg by mouth at bedtime.  . traZODone (DESYREL) 150 MG tablet Take 150 mg by mouth at bedtime.  Marland Kitchen. venlafaxine (EFFEXOR) 37.5 MG tablet Take 37.5 mg by mouth 3 (three) times daily with meals.    Allergies  Allergen  Reactions  . Penicillins Hives and Swelling    Swelling of face Has patient had a PCN reaction causing immediate rash, facial/tongue/throat swelling, SOB or lightheadedness with hypotension: No Has patient had a PCN reaction causing severe rash involving mucus membranes or skin necrosis: Yes Has patient had a PCN reaction that required hospitalization No Has patient had a PCN reaction occurring within the last 10 years: No If all of the above answers are "NO", then may proceed with Cephalosporin use.       Review of Systems     Objective:   Physical Exam  NAD Lungs:  CTA CV:  RRR without murmur or rub, radial pulses normal and equal Tender all over back musculature.  Unable to localize specific area more tender. Full ROM. Neuro:  Motor 5/5  DTRs 2+/4 throughout.  Sensory grossly normal.  Normal gait.      Assessment & Plan:  Chronic Back Pain with findings on MRI of right L5 foraminal stenosis and bilateral mild changes at L4/5. To increase gabapentin to 900 mg 3 times daily, one dose at a time over 9 day period. Return for CPE in 3 months. Continue with ortho

## 2016-09-09 ENCOUNTER — Emergency Department (HOSPITAL_COMMUNITY)
Admission: EM | Admit: 2016-09-09 | Discharge: 2016-09-11 | Disposition: A | Discharge status: Home / Self Care | Payer: Medicaid Other | Attending: Emergency Medicine | Admitting: Emergency Medicine

## 2016-09-09 DIAGNOSIS — Z9114 Patient's other noncompliance with medication regimen: Secondary | ICD-10-CM | POA: Insufficient documentation

## 2016-09-09 DIAGNOSIS — F17210 Nicotine dependence, cigarettes, uncomplicated: Secondary | ICD-10-CM | POA: Diagnosis not present

## 2016-09-09 DIAGNOSIS — F209 Schizophrenia, unspecified: Secondary | ICD-10-CM | POA: Diagnosis not present

## 2016-09-09 DIAGNOSIS — Z79899 Other long term (current) drug therapy: Secondary | ICD-10-CM | POA: Insufficient documentation

## 2016-09-09 DIAGNOSIS — F419 Anxiety disorder, unspecified: Secondary | ICD-10-CM | POA: Diagnosis not present

## 2016-09-09 LAB — CBC WITH DIFFERENTIAL/PLATELET
Basophils Absolute: 0 10*3/uL (ref 0.0–0.1)
Basophils Relative: 0 %
EOS ABS: 0.2 10*3/uL (ref 0.0–0.7)
Eosinophils Relative: 1 %
HCT: 41.9 % (ref 39.0–52.0)
HEMOGLOBIN: 14.8 g/dL (ref 13.0–17.0)
LYMPHS ABS: 3.3 10*3/uL (ref 0.7–4.0)
Lymphocytes Relative: 28 %
MCH: 30.3 pg (ref 26.0–34.0)
MCHC: 35.3 g/dL (ref 30.0–36.0)
MCV: 85.9 fL (ref 78.0–100.0)
MONOS PCT: 8 %
Monocytes Absolute: 0.9 10*3/uL (ref 0.1–1.0)
NEUTROS PCT: 63 %
Neutro Abs: 7.3 10*3/uL (ref 1.7–7.7)
Platelets: 200 10*3/uL (ref 150–400)
RBC: 4.88 MIL/uL (ref 4.22–5.81)
RDW: 14.3 % (ref 11.5–15.5)
WBC: 11.8 10*3/uL — ABNORMAL HIGH (ref 4.0–10.5)

## 2016-09-09 LAB — COMPREHENSIVE METABOLIC PANEL
ALBUMIN: 4.2 g/dL (ref 3.5–5.0)
ALK PHOS: 66 U/L (ref 38–126)
ALT: 17 U/L (ref 17–63)
ANION GAP: 9 (ref 5–15)
AST: 40 U/L (ref 15–41)
BUN: 9 mg/dL (ref 6–20)
CALCIUM: 9.3 mg/dL (ref 8.9–10.3)
CO2: 27 mmol/L (ref 22–32)
CREATININE: 1.07 mg/dL (ref 0.61–1.24)
Chloride: 103 mmol/L (ref 101–111)
GFR calc Af Amer: >60 mL/min (ref >60)
GFR calc non Af Amer: >60 mL/min (ref >60)
GLUCOSE: 112 mg/dL — AB (ref 65–99)
Potassium: 3.2 mmol/L — ABNORMAL LOW (ref 3.5–5.1)
SODIUM: 139 mmol/L (ref 135–145)
Total Bilirubin: 0.7 mg/dL (ref 0.3–1.2)
Total Protein: 7.7 g/dL (ref 6.5–8.1)

## 2016-09-09 LAB — ETHANOL: Alcohol, Ethyl (B): <5 mg/dL (ref <5)

## 2016-09-09 MED ORDER — GABAPENTIN 300 MG PO CAPS
900.0000 mg | ORAL_CAPSULE | Freq: Three times a day (TID) | ORAL | Status: DC
  Administered 2016-09-09 – 2016-09-11 (×5): 900 mg via ORAL
  Filled 2016-09-09 (×6): qty 3

## 2016-09-09 MED ORDER — ONDANSETRON HCL 4 MG PO TABS: 4.0000 mg | ORAL_TABLET | Freq: Three times a day (TID) | ORAL | Status: DC | PRN

## 2016-09-09 MED ORDER — LURASIDONE HCL 40 MG PO TABS
80.0000 mg | ORAL_TABLET | Freq: Every day | ORAL | Status: DC
  Administered 2016-09-09 – 2016-09-10 (×2): 80 mg via ORAL
  Filled 2016-09-09 (×3): qty 2

## 2016-09-09 MED ORDER — TRAZODONE HCL 50 MG PO TABS
150.0000 mg | ORAL_TABLET | Freq: Every evening | ORAL | Status: DC | PRN
  Administered 2016-09-09: 23:00:00 150 mg via ORAL
  Filled 2016-09-09: qty 1

## 2016-09-09 MED ORDER — ALUM & MAG HYDROXIDE-SIMETH 200-200-20 MG/5ML PO SUSP: 30.0000 mL | Freq: Four times a day (QID) | ORAL | Status: DC | PRN

## 2016-09-09 MED ORDER — IBUPROFEN 200 MG PO TABS: 600.0000 mg | ORAL_TABLET | Freq: Three times a day (TID) | ORAL | Status: DC | PRN

## 2016-09-09 MED ORDER — DIVALPROEX SODIUM 500 MG PO DR TAB
1000.0000 mg | DELAYED_RELEASE_TABLET | Freq: Every morning | ORAL | Status: DC
  Administered 2016-09-10: 500 mg via ORAL
  Administered 2016-09-11: 1000 mg via ORAL
  Filled 2016-09-09 (×2): qty 2

## 2016-09-09 MED ORDER — HYDROXYZINE HCL 25 MG PO TABS: 25.0000 mg | ORAL_TABLET | Freq: Three times a day (TID) | ORAL | Status: DC | PRN

## 2016-09-09 MED ORDER — DIVALPROEX SODIUM 500 MG PO DR TAB
500.0000 mg | DELAYED_RELEASE_TABLET | Freq: Every day | ORAL | Status: DC
  Administered 2016-09-09 – 2016-09-10 (×2): 500 mg via ORAL
  Filled 2016-09-09 (×2): qty 1

## 2016-09-09 MED ORDER — BACLOFEN 10 MG PO TABS
10.0000 mg | ORAL_TABLET | Freq: Two times a day (BID) | ORAL | Status: DC
  Administered 2016-09-09 – 2016-09-11 (×4): 10 mg via ORAL
  Filled 2016-09-09 (×4): qty 1

## 2016-09-09 NOTE — BH Assessment (Signed)
[facility] Assessment Progress Note   Pt is recommended for inpt treatment by Nira ConnJason Berry, NP. Hardie LoraLilibeth, RN and EDP Dr. Rubin PayorPickering, MD notified of recommendation.   Princess BruinsAquicha Ardeth Repetto, MSW, LCSWA TTS Specialist [phone]

## 2016-09-09 NOTE — ED Triage Notes (Signed)
Patient IVC'd by his wife because he called her from his car and said he wanted to die.  Patient had barricaded himself in a room, but when police arrived he had left.  He has a history of paranoid schizophrenia, multiple personalities, bipolar and depression.  Patient calm and cooperative at this time, alert and oriented.  Patient denies SI or HI at this time.  Denies AVH.

## 2016-09-09 NOTE — ED Provider Notes (Signed)
[facility]-EMERGENCY DEPT Provider Note   CSN: [id number] Arrival date & time: 09/09/16  1732     History   Chief Complaint Chief Complaint  Patient presents with  . IVC, Paranoid Schizophrenia    HPI Danny Lester is a 41 y.o. male.  HPI Patient brought in under IVC. Reportedly was suicidal earlier today and had barricaded himself in the house. Patient states is no longer suicidal. History of paranoid schizophrenia. States he had a court case yesterday. States his been off his medicines for a few days because he was too busy to take them. Denies hallucinations. He was IVC by family member. Denies suicide attempt. Denies substance abuse. Past Medical History:  Diagnosis Date  . Anxiety   . Depression   . Multiple personality disorder   . Nerve pain   . PTSD (post-traumatic stress disorder)   . Schizophrenia, paranoid, chronic [facility])     Patient Active Problem List   Diagnosis Date Noted  . Herpes simplex 02/11/2016  . Dehydration 12/01/2015  . Leukocytosis 12/01/2015  . Schizophrenia (HCC) 12/01/2015  . 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 Carpal tunnel syndrome, bilateral 09/28/2015  . Neck pain 09/28/2015  . Low back pain 09/28/2015  . Plantar fasciitis, bilateral 09/28/2015  . Dental decay 09/28/2015    Past Surgical History:  Procedure Laterality Date  . KNEE SURGERY         Home Medications    Prior to Admission medications   Medication Sig Start Date End Date Taking? Authorizing Provider  acyclovir (ZOVIRAX) 400 MG tablet 1 tab by mouth twice daily 02/11/16  Yes Julieanne Manson, MD  baclofen (LIORESAL) 10 MG tablet 1/2 tab by mouth twice daily only as needed Patient taking differently: 10 mg 2 (two) times daily.  06/13/16  Yes Julieanne Manson, MD  divalproex (DEPAKOTE) 500 MG DR tablet Take 500 mg by mouth. 2 in the am and 1 in the pm   Yes [provider]  gabapentin (NEURONTIN) 600 MG tablet 1 1/2 tabs by mouth 3 times daily. 08/23/16  Yes  Julieanne Manson, MD  hydrOXYzine (ATARAX/VISTARIL) 25 MG tablet Take 25 mg by mouth 3 (three) times daily as needed.   Yes [provider]  lurasidone (LATUDA) 80 MG TABS tablet Take 80 mg by mouth at bedtime.   Yes [provider]  traZODone (DESYREL) 150 MG tablet Take 150 mg by mouth at bedtime as needed for sleep.    Yes [provider]  Diclofenac Sodium 3 % GEL Apply twice daily to affected areas Patient not taking: Reported on 09/09/2016 06/19/16   Julieanne Manson, MD  venlafaxine (EFFEXOR) 37.5 MG tablet Take 37.5 mg by mouth 3 (three) times daily with meals.    [provider]    Family History No family history on file.  Social History Social History  Substance Use Topics  . Smoking status: Current Some Day Smoker    Packs/day: 0.30    Types: Cigarettes  . Smokeless tobacco: Never Used  . Alcohol use Yes     Comment: beer occasionally     Allergies   Penicillins and Risperdal [risperidone]   Review of Systems Review of Systems  Constitutional: Negative for appetite change.  HENT: Negative for congestion.   Respiratory: Negative for chest tightness and shortness of breath.   Cardiovascular: Negative for chest pain.  Gastrointestinal: Negative for abdominal pain.  Genitourinary: Negative for difficulty urinating.  Musculoskeletal: Negative for back pain.  Skin: Negative for rash.  Neurological: Negative  for tremors.  Psychiatric/Behavioral: Positive for suicidal ideas. Negative for dysphoric mood and hallucinations.     Physical Exam Updated Vital Signs BP 118/79 (BP Location: Right Arm)   Pulse (!) 55   Temp 98.6 F (37 C) (Oral)   Resp 20   SpO2 99%   Physical Exam  Constitutional: He appears well-developed and well-nourished.  HENT:  Head: Normocephalic.  Eyes: Pupils are equal, round, and reactive to light.  Neck: Neck supple.  Pulmonary/Chest: Effort normal.  Abdominal: Soft. There is no tenderness.    Musculoskeletal: He exhibits no edema.  Neurological: He is alert.  Skin: Skin is warm. Capillary refill takes less than 2 seconds.  Psychiatric: He has a normal mood and affect.     ED Treatments / Results  Labs (all labs ordered are listed, but only abnormal results are displayed) Labs Reviewed  COMPREHENSIVE METABOLIC PANEL - Abnormal; Notable for the following:       Result Value   Potassium 3.2 (*)    Glucose, Bld 112 (*)    All other components within normal limits  CBC WITH DIFFERENTIAL/PLATELET - Abnormal; Notable for the following:    WBC 11.8 (*)    All other components within normal limits  ETHANOL  RAPID URINE DRUG SCREEN, HOSP PERFORMED    EKG  EKG Interpretation None       Radiology No results found.  Procedures Procedures (including critical care time)  Medications Ordered in ED Medications  baclofen (LIORESAL) tablet 10 mg (10 mg Oral Given 09/09/16 2214)  gabapentin (NEURONTIN) capsule 900 mg (900 mg Oral Given 09/09/16 2214)  hydrOXYzine (ATARAX/VISTARIL) tablet 25 mg (not administered)  lurasidone (LATUDA) tablet 80 mg (80 mg Oral Given 09/09/16 2214)  traZODone (DESYREL) tablet 150 mg (150 mg Oral Given 09/09/16 2250)  divalproex (DEPAKOTE) DR tablet 1,000 mg (not administered)  divalproex (DEPAKOTE) DR tablet 500 mg (500 mg Oral Given 09/09/16 2214)  ibuprofen (ADVIL,MOTRIN) tablet 600 mg (not administered)  ondansetron (ZOFRAN) tablet 4 mg (not administered)  alum & mag hydroxide-simeth (MAALOX/MYLANTA) 200-200-20 MG/5ML suspension 30 mL (not administered)     Initial Impression / Assessment and Plan / ED Course  I have reviewed the triage vital signs and the nursing notes.  Pertinent labs & imaging results that were available during my care of the patient were reviewed by me and considered in my medical decision making (see chart for details).     Patient brought in under IVC for his schizophrenia. Medically cleared. Seen by TTS and recommended  inpatient treatment.  Final Clinical Impressions(s) / ED Diagnoses   Final diagnoses:  Schizophrenia, unspecified type (HCC)  Noncompliance with medication regimen    New Prescriptions New Prescriptions   No medications on file     Benjiman CorePickering, Eren Ryser, MD 09/10/16 0002

## 2016-09-09 NOTE — BH Assessment (Addendum)
Assessment Note  Danny Lester is an 41 y.o. male who presents to the ED under IVC initiated by his wife. TTS asked for the pt's permission to contact the petitioner of the IVC in order to obtain collateral information and the pt refused. Pt stated "no, I don't give you permission to call her. She is going to try to paint me out like I'm crazy or something so no, I don't want you to call her." According to IVC documents, the pt has a hx of paranoid schizophrenia, multiple personalities, Bipolar, and depression. Pt was reportedly driving around in a car today and called his wife and told her that he barricaded himself in a room and was ready to die. IVC states when police arrived to the scene, the pt had already left the area.   During the assessment, the pt admits he was experiencing SI "earlier today" and when asked what triggers led the pt to his SI he stated "just tired of people not believing me." Pt reports he knows his wife is cheating on him and he almost caught the individual with whom she was cheating. Pt reports he feels his wife has caused his family to turn against him. Pt reports he plans on separating from his wife.   Pt endorses HI and states he has a hx of attempted murder. Pt admits to aggressive and violent acts towards others when he becomes angry. Note, pt was calm and cooperative with this Clinical research associatewriter during the assessment and has not shown any aggression in the ED at present. Pt reports he wanted to "hurt 5 people today" and when asked who he wanted to hurt, the pt stated "just anyone, anyone I could."   Pt reports he is [facility] but admits he is not taking his medication regularly as he sometimes misses a dose. Pt denies AVH at present. Pt has been seen in the ED c/o similar concerns in April 2018 but denies that he has ever received inpt treatment. Pt reports he used to receive SSI however when he got married, they cut his SSI because his wife was also  receiving SSI. Pt has requested a Child psychotherapistsocial worker speak with him in regards to housing resources since he plans to leave his wife and how to reinstate his SSI. Social work consult requested.   Case discussed with Nira ConnJason Berry, NP who recommends inpt treatment due to risk factors for suicide and reports made on IVC.  Diagnosis: Schizophrenia (paranoid type) per hx  Past Medical History:  Past Medical History:  Diagnosis Date  . Anxiety   . Depression   . Multiple personality disorder   . Nerve pain   . PTSD (post-traumatic stress disorder)   . Schizophrenia, paranoid, chronic (HCC)     Past Surgical History:  Procedure Laterality Date  . KNEE SURGERY      Family History: No family history on file.  Social History:  reports that he has been smoking Cigarettes.  He has been smoking about 0.30 packs per day. He has never used smokeless tobacco. He reports that he drinks alcohol. He reports that he does not use drugs.  Additional Social History:  Alcohol / Drug Use Pain Medications: See MAR Prescriptions: See MAR Over the Counter: See MAR History of alcohol / drug use?: Yes (pt did not disclose when asked for details. Labs currently pending )  CIWA:   COWS:    Allergies:  Allergies  Allergen Reactions  . Penicillins Hives and  Swelling    Swelling of face Has patient had a PCN reaction causing immediate rash, facial/tongue/throat swelling, SOB or lightheadedness with hypotension: No Has patient had a PCN reaction causing severe rash involving mucus membranes or skin necrosis: Yes Has patient had a PCN reaction that required hospitalization No Has patient had a PCN reaction occurring within the last 10 years: No If all of the above answers are "NO", then may proceed with Cephalosporin use.   Marland Kitchen Risperdal [Risperidone] Other (See Comments)    Per pt: "starts shaking on one side"    Home Medications:  (Not in a hospital admission)  OB/GYN Status:  No LMP for male  patient.  General Assessment Data Location of Assessment: [facility] ED TTS Assessment: In system Is this a Tele or Face-to-Face Assessment?: Face-to-Face Is this an Initial Assessment or a Re-assessment for this encounter?: Initial Assessment Marital status: Married Is patient pregnant?: No Pregnancy Status: No Living Arrangements: Spouse/significant other Can pt return to current living arrangement?: Yes Admission Status: Involuntary Is patient capable of signing voluntary admission?: No Referral Source: Self/Family/Friend Insurance type: Medicaid     Crisis Care Plan Living Arrangements: Spouse/significant other Name of Psychiatrist: Transport planner Name of Therapist: Transport planner  Education Status Is patient currently in school?: No Highest grade of school patient has completed: GED  Risk to self with the past 6 months Suicidal Ideation: Yes-Currently Present Has patient been a risk to self within the past 6 months prior to admission? : Yes Suicidal Intent: Yes-Currently Present Has patient had any suicidal intent within the past 6 months prior to admission? : Yes Is patient at risk for suicide?: Yes Suicidal Plan?: No Has patient had any suicidal plan within the past 6 months prior to admission? : No Access to Means: No What has been your use of drugs/alcohol within the last 12 months?: pt denies recent drug use but states he has a "history", does not disclose details to this Clinical research associate regarding the hx Previous Attempts/Gestures: No Triggers for Past Attempts: None known Intentional Self Injurious Behavior: None Family Suicide History: Unknown Recent stressful life event(s): Conflict (Comment) (w/ wife) Persecutory voices/beliefs?: No Depression: No Substance abuse history and/or treatment for substance abuse?: Yes Suicide prevention information given to non-admitted patients: Not applicable  Risk to Others within the past 6 months Homicidal Ideation: Yes-Currently Present Does patient  have any lifetime risk of violence toward others beyond the six months prior to admission? : Yes (comment) (reports attempted murder charges, hx of aggression) Thoughts of Harm to Others: Yes-Currently Present Comment - Thoughts of Harm to Others: pt states he wants to hurt "5 people" when asked whom, he stated he did not care, just anyone  Current Homicidal Intent: No Current Homicidal Plan: No Access to Homicidal Means: No Identified Victim: "5 people" History of harm to others?: Yes Assessment of Violence: On admission Violent Behavior Description: reports an attempted murder charge but no conviction, aggression and violence towards other  Does patient have access to weapons?: No Criminal Charges Pending?: Yes Describe Pending Criminal Charges: larceny, credit card fraud  Does patient have a court date: Yes Court Date:  (pt reports in October but unsure exact date ) Is patient on probation?: Unknown  Psychosis Hallucinations: None noted (pt denies) Delusions: Unspecified  Mental Status Report Appearance/Hygiene: In scrubs Eye Contact: Good Motor Activity: Freedom of movement Speech: Logical/coherent Level of Consciousness: Alert Mood: Anxious, Other (Comment) (paranoid ) Affect: Anxious Anxiety Level: Severe Thought Processes: Coherent Judgement: Impaired Orientation: Person, Place,  Situation, Time, Appropriate for developmental age Obsessive Compulsive Thoughts/Behaviors: None  Cognitive Functioning Concentration: Normal Memory: Remote Intact, Recent Intact IQ: Average Insight: Poor Impulse Control: Poor Appetite: Poor Sleep: Decreased Total Hours of Sleep: 6 Vegetative Symptoms: None  ADLScreening [facility] Assessment Services) Patient's cognitive ability adequate to safely complete daily activities?: Yes Patient able to express need for assistance with ADLs?: Yes Independently performs ADLs?: Yes (appropriate for developmental age)  Prior Inpatient Therapy Prior  Inpatient Therapy: No  Prior Outpatient Therapy Prior Outpatient Therapy: Yes Prior Therapy Dates: current Prior Therapy Facilty/Provider(s): [facility] Reason for Treatment: Med management  Does patient have an ACCT team?: No Does patient have Intensive In-House Services?  : No Does patient have [facility] services? : Yes Does patient have P4CC services?: No  ADL Screening (condition at time of admission) Patient's cognitive ability adequate to safely complete daily activities?: Yes Is the patient deaf or have difficulty hearing?: No Does the patient have difficulty seeing, even when wearing glasses/contacts?: No Does the patient have difficulty concentrating, remembering, or making decisions?: No Patient able to express need for assistance with ADLs?: Yes Does the patient have difficulty dressing or bathing?: No Independently performs ADLs?: Yes (appropriate for developmental age) Does the patient have difficulty walking or climbing stairs?: No Weakness of Legs: None Weakness of Arms/Hands: None  Home Assistive Devices/Equipment Home Assistive Devices/Equipment: None    Abuse/Neglect Assessment (Assessment to be complete while patient is alone) Physical Abuse: Denies Verbal Abuse: Denies Sexual Abuse: Denies Exploitation of patient/patient's resources: Denies Self-Neglect: Denies   Consults Social Work Consult Needed: Yes (Comment) ( Pt has requested a Child psychotherapist speak with him in regards to housing resources since he plans to leave his wife and how to reinstate his SSI. Social work consult requested. ) Merchant navy officer (For Healthcare) Does Patient Have a Medical Advance Directive?: No Would patient like information on creating a medical advance directive?: No - Patient declined    Additional Information 1:1 In Past 12 Months?: No CIRT Risk: Yes Elopement Risk: No Does patient have medical clearance?: Yes     Disposition:  Disposition Initial Assessment Completed  for this Encounter: Yes Disposition of Patient: Inpatient treatment program Type of inpatient treatment program: Adult (per Nira Conn, NP)  On Site Evaluation by:   Reviewed with Physician:    Karolee Ohs 09/09/2016 8:27 PM

## 2016-09-09 NOTE — ED Notes (Signed)
Pt. Transferred to [facility] from ED to room 37 after screening for contraband. Report to include Situation, Background, Assessment and Recommendations from [facility]. Pt. Oriented to unit including Q15 minute rounds as well as the security cameras for their protection. Patient is alert and oriented, warm and dry in no acute distress. Patient denies SI, HI, and AVH. Pt. Encouraged to let me know if needs arise.

## 2016-09-09 NOTE — Progress Notes (Signed)
Per Nira ConnJason Berry, NP pt is recommended for inpt treatment. TTS attempted to contact EDP Dr. Rubin PayorPickering, MD at [phone] but did not get an answer. TTS attempted to contact the pt's RN at [phone] but did not get an answer. Will attempt to contact at a later time to notify of disposition.  Princess BruinsAquicha Rebbie Lauricella, MSW, LCSWA TTS Specialist [phone]

## 2016-09-09 NOTE — ED Notes (Signed)
Bed: WA31 Expected date:  Expected time:  Means of arrival:  Comments: 

## 2016-09-09 NOTE — Progress Notes (Signed)
Pt reports he used to receive SSI however when he got married, they cut his SSI because his wife was also receiving SSI. Pt has requested a Child psychotherapistsocial worker speak with him in regards to housing resources since he plans to leave his wife and how to reinstate his SSI. Social work consult requested.   Danny Lester, MSW, LCSWA TTS Specialist [phone]

## 2016-09-10 DIAGNOSIS — R450 Nervousness: Secondary | ICD-10-CM

## 2016-09-10 DIAGNOSIS — F419 Anxiety disorder, unspecified: Secondary | ICD-10-CM

## 2016-09-10 DIAGNOSIS — R4587 Impulsiveness: Secondary | ICD-10-CM

## 2016-09-10 DIAGNOSIS — F17210 Nicotine dependence, cigarettes, uncomplicated: Secondary | ICD-10-CM

## 2016-09-10 DIAGNOSIS — Z9114 Patient's other noncompliance with medication regimen: Secondary | ICD-10-CM | POA: Insufficient documentation

## 2016-09-10 DIAGNOSIS — F209 Schizophrenia, unspecified: Secondary | ICD-10-CM | POA: Diagnosis not present

## 2016-09-10 LAB — RAPID URINE DRUG SCREEN, HOSP PERFORMED
Amphetamines: NOT DETECTED
Barbiturates: NOT DETECTED
Benzodiazepines: NOT DETECTED
Cocaine: POSITIVE — AB
OPIATES: NOT DETECTED
Tetrahydrocannabinol: POSITIVE — AB

## 2016-09-10 MED ORDER — ACYCLOVIR 400 MG PO TABS
400.0000 mg | ORAL_TABLET | Freq: Two times a day (BID) | ORAL | Status: DC
  Administered 2016-09-10 – 2016-09-11 (×3): 400 mg via ORAL
  Filled 2016-09-10 (×4): qty 1

## 2016-09-10 NOTE — ED Notes (Signed)
Hourly rounding reveals patient sleeping in room. No complaints, stable, in no acute distress. Q15 minute rounds and monitoring via Security Cameras to continue. 

## 2016-09-10 NOTE — ED Notes (Signed)
Up tot he bathroom to shower and change scrubs 

## 2016-09-10 NOTE — ED Notes (Signed)
On the phone 

## 2016-09-10 NOTE — ED Notes (Signed)
Up to the bathroom 

## 2016-09-10 NOTE — ED Notes (Signed)
Report to include situation, background, assessment and recommendations from Janie RN. Patient sleeping, respirations regular and unlabored. Q15 minute rounds and security camera observation to continue.   

## 2016-09-10 NOTE — ED Notes (Signed)
Patient awake alert and oriented, warm and dry, in no acute distress. Patient denies SI, HI, AVH and pain. Patient made aware of Q15 minute rounds and security cameras for their safety. Patient instructed to come to me with needs or concerns.

## 2016-09-10 NOTE — Consult Note (Signed)
[facility] Consult   Reason for Consult:  Suicidal Ideation Referring Physician:  EDP Patient Identification: Danny Lester MRN:  [id number] Principal Diagnosis: Schizophrenia [facility]) Diagnosis:   Patient Active Problem List   Diagnosis Date Noted  . Schizophrenia ([place]) [F20.9] 12/01/2015    Priority: High  . Noncompliance with medication regimen [Z91.14]   . Herpes simplex [B00.9] 02/11/2016  . Dehydration [E86.0] 12/01/2015  . Leukocytosis [D72.829] 12/01/2015  . 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 Carpal tunnel syndrome, bilateral [G56.03] 09/28/2015  . Neck pain [M54.2] 09/28/2015  . Low back pain [M54.5] 09/28/2015  . Plantar fasciitis, bilateral [M72.2] 09/28/2015  . Dental decay [K02.9] 09/28/2015    Total Time spent with patient: 45 minutes  Subjective:   Danny Lester is a 41 y.o. male patient admitted with under IVC placed by his wife.  HPI:  Pt was seen and chart reviewed with treatment team.  Pt denies suicidal/homicidal ideation, denies auditory/visual hallucinations and does not appear to be responding to internal stimuli. Pt was calm and cooperative, alert & oriented x 4, and appropriate for the situation. Pt stated he was sitting on his porch and the police showed up and took him to the hospital. Pt stated his wife made up a bunch of lies about him so she can be unfaithful with another dude. Pt stated his baby mama in [place], [place] said she would come and get him and bring him to her house. Danny Lester [phone], a HIPAA compliant voice mail was left. No call back yet so Pt will be observed overnight in the [facility] and re-evaluated in the AM.   Past Psychiatric History: As above  Risk to Self: Suicidal Ideation: Yes-Currently Present Suicidal Intent: Yes-Currently Present Is patient at risk for suicide?: Yes Suicidal Plan?: No Access to Means: No What has been your use of drugs/alcohol within the last 12 months?: pt denies recent drug  use but states he has a "history", does not disclose details to this Probation officer regarding the hx Triggers for Past Attempts: None known Intentional Self Injurious Behavior: None Risk to Others: Homicidal Ideation: Yes-Currently Present Thoughts of Harm to Others: Yes-Currently Present Comment - Thoughts of Harm to Others: pt states he wants to hurt "5 people" when asked whom, he stated he did not care, just anyone  Current Homicidal Intent: No Current Homicidal Plan: No Access to Homicidal Means: No Identified Victim: "5 people" History of harm to others?: Yes Assessment of Violence: On admission Violent Behavior Description: reports an attempted murder charge but no conviction, aggression and violence towards other  Does patient have access to weapons?: No Criminal Charges Pending?: Yes Describe Pending Criminal Charges: larceny, credit card fraud  Does patient have a court date: Yes Court Date:  (pt reports in October but unsure exact date ) Prior Inpatient Therapy: Prior Inpatient Therapy: No Prior Outpatient Therapy: Prior Outpatient Therapy: Yes Prior Therapy Dates: current Prior Therapy Facilty/Provider(s): [facility] Reason for Treatment: Med management  Does patient have an ACCT team?: No Does patient have Intensive In-House Services?  : No Does patient have [facility] services? : Yes Does patient have P4CC services?: No  Past Medical History:  Past Medical History:  Diagnosis Date  . Anxiety   . Depression   . Multiple personality disorder   . Nerve pain   . PTSD (post-traumatic stress disorder)   . Schizophrenia, paranoid, chronic ([place])     Past Surgical History:  Procedure Laterality Date  . KNEE SURGERY  Family History: No family history on file. Family Psychiatric  History: Unknown Social History:  History  Alcohol Use  . Yes    Comment: beer occasionally     History  Drug Use No    Social History   Social History  . Marital status: Single    Spouse  name: N/A  . Number of children: N/A  . Years of education: N/A   Social History Main Topics  . Smoking status: Current Some Day Smoker    Packs/day: 0.30    Types: Cigarettes  . Smokeless tobacco: Never Used  . Alcohol use Yes     Comment: beer occasionally  . Drug use: No  . Sexual activity: Yes    Birth control/ protection: None   Other Topics Concern  . Not on file   Social History Narrative  . No narrative on file   Additional Social History:    Allergies:   Allergies  Allergen Reactions  . Penicillins Hives and Swelling    Swelling of face Has patient had a PCN reaction causing immediate rash, facial/tongue/throat swelling, SOB or lightheadedness with hypotension: No Has patient had a PCN reaction causing severe rash involving mucus membranes or skin necrosis: Yes Has patient had a PCN reaction that required hospitalization No Has patient had a PCN reaction occurring within the last 10 years: No If all of the above answers are "NO", then may proceed with Cephalosporin use.   Marland Kitchen Risperdal [Risperidone] Other (See Comments)    Per pt: "starts shaking on one side"    Labs:  Results for orders placed or performed during the hospital encounter of 09/09/16 (from the past 48 hour(s))  Urine rapid drug screen (hosp performed)     Status: Abnormal   Collection Time: 09/09/16  9:13 AM  Result Value Ref Range   Opiates NONE DETECTED NONE DETECTED   Cocaine POSITIVE (A) NONE DETECTED   Benzodiazepines NONE DETECTED NONE DETECTED   Amphetamines NONE DETECTED NONE DETECTED   Tetrahydrocannabinol POSITIVE (A) NONE DETECTED   Barbiturates NONE DETECTED NONE DETECTED    Comment:        DRUG SCREEN FOR MEDICAL PURPOSES ONLY.  IF CONFIRMATION IS NEEDED FOR ANY PURPOSE, NOTIFY LAB WITHIN 5 DAYS.        LOWEST DETECTABLE LIMITS FOR URINE DRUG SCREEN Drug Class       Cutoff (ng/mL) Amphetamine      1000 Barbiturate      200 Benzodiazepine   751 Tricyclics        025 Opiates          300 Cocaine          300 THC              50   Comprehensive metabolic panel     Status: Abnormal   Collection Time: 09/09/16  6:33 PM  Result Value Ref Range   Sodium 139 135 - 145 mmol/L   Potassium 3.2 (L) 3.5 - 5.1 mmol/L   Chloride 103 101 - 111 mmol/L   CO2 27 22 - 32 mmol/L   Glucose, Bld 112 (H) 65 - 99 mg/dL   BUN 9 6 - 20 mg/dL   Creatinine, Ser 1.07 0.61 - 1.24 mg/dL   Calcium 9.3 8.9 - 10.3 mg/dL   Total Protein 7.7 6.5 - 8.1 g/dL   Albumin 4.2 3.5 - 5.0 g/dL   AST 40 15 - 41 U/L   ALT 17 17 - 63 U/L  Alkaline Phosphatase 66 38 - 126 U/L   Total Bilirubin 0.7 0.3 - 1.2 mg/dL   GFR calc non Af Amer >60 >60 mL/min   GFR calc Af Amer >60 >60 mL/min    Comment: (NOTE) The eGFR has been calculated using the CKD EPI equation. This calculation has not been validated in all clinical situations. eGFR's persistently <60 mL/min signify possible Chronic Kidney Disease.    Anion gap 9 5 - 15  Ethanol     Status: None   Collection Time: 09/09/16  6:33 PM  Result Value Ref Range   Alcohol, Ethyl (B) <5 <5 mg/dL    Comment:        LOWEST DETECTABLE LIMIT FOR SERUM ALCOHOL IS 5 mg/dL FOR MEDICAL PURPOSES ONLY   CBC with Differential     Status: Abnormal   Collection Time: 09/09/16  6:33 PM  Result Value Ref Range   WBC 11.8 (H) 4.0 - 10.5 K/uL   RBC 4.88 4.22 - 5.81 MIL/uL   Hemoglobin 14.8 13.0 - 17.0 g/dL   HCT 41.9 39.0 - 52.0 %   MCV 85.9 78.0 - 100.0 fL   MCH 30.3 26.0 - 34.0 pg   MCHC 35.3 30.0 - 36.0 g/dL   RDW 14.3 11.5 - 15.5 %   Platelets 200 150 - 400 K/uL   Neutrophils Relative % 63 %   Neutro Abs 7.3 1.7 - 7.7 K/uL   Lymphocytes Relative 28 %   Lymphs Abs 3.3 0.7 - 4.0 K/uL   Monocytes Relative 8 %   Monocytes Absolute 0.9 0.1 - 1.0 K/uL   Eosinophils Relative 1 %   Eosinophils Absolute 0.2 0.0 - 0.7 K/uL   Basophils Relative 0 %   Basophils Absolute 0.0 0.0 - 0.1 K/uL    Current Facility-Administered Medications   Medication Dose Route Frequency Provider Last Rate Last Dose  . acyclovir (ZOVIRAX) tablet 400 mg  400 mg Oral BID Ethelene Hal, NP   400 mg at 09/10/16 1420  . alum & mag hydroxide-simeth (MAALOX/MYLANTA) 200-200-20 MG/5ML suspension 30 mL  30 mL Oral Q6H PRN Davonna Belling, MD      . baclofen (LIORESAL) tablet 10 mg  10 mg Oral BID Davonna Belling, MD   10 mg at 09/10/16 0917  . divalproex (DEPAKOTE) DR tablet 1,000 mg  1,000 mg Oral q morning - 10a Davonna Belling, MD   500 mg at 09/10/16 0917  . divalproex (DEPAKOTE) DR tablet 500 mg  500 mg Oral Lowanda Foster, MD   500 mg at 09/09/16 2214  . gabapentin (NEURONTIN) capsule 900 mg  900 mg Oral TID Davonna Belling, MD   900 mg at 09/10/16 0920  . hydrOXYzine (ATARAX/VISTARIL) tablet 25 mg  25 mg Oral TID PRN Davonna Belling, MD      . ibuprofen (ADVIL,MOTRIN) tablet 600 mg  600 mg Oral Q8H PRN Davonna Belling, MD      . lurasidone (LATUDA) tablet 80 mg  80 mg Oral Lowanda Foster, MD   80 mg at 09/09/16 2214  . ondansetron (ZOFRAN) tablet 4 mg  4 mg Oral Q8H PRN Davonna Belling, MD      . traZODone (DESYREL) tablet 150 mg  150 mg Oral QHS PRN Davonna Belling, MD   150 mg at 09/09/16 2250   Current Outpatient Prescriptions  Medication Sig Dispense Refill  . acyclovir (ZOVIRAX) 400 MG tablet 1 tab by mouth twice daily 60 tablet 11  . baclofen (LIORESAL) 10 MG tablet  1/2 tab by mouth twice daily only as needed (Patient taking differently: 10 mg 2 (two) times daily. ) 30 each 3  . divalproex (DEPAKOTE) 500 MG DR tablet Take 500 mg by mouth. 2 in the am and 1 in the pm    . gabapentin (NEURONTIN) 600 MG tablet 1 1/2 tabs by mouth 3 times daily. 135 tablet 11  . hydrOXYzine (ATARAX/VISTARIL) 25 MG tablet Take 25 mg by mouth 3 (three) times daily as needed.    . lurasidone (LATUDA) 80 MG TABS tablet Take 80 mg by mouth at bedtime.    . traZODone (DESYREL) 150 MG tablet Take 150 mg by mouth at bedtime as  needed for sleep.     . Diclofenac Sodium 3 % GEL Apply twice daily to affected areas (Patient not taking: Reported on 09/09/2016) 100 g 6  . venlafaxine (EFFEXOR) 37.5 MG tablet Take 37.5 mg by mouth 3 (three) times daily with meals.      Musculoskeletal: Strength & Muscle Tone: within normal limits Gait & Station: normal Patient leans: N/A  Psychiatric Specialty Exam: Physical Exam  Constitutional: He appears well-developed and well-nourished.  Respiratory: Effort normal.  Musculoskeletal: Normal range of motion.  Neurological: He is alert.  Psychiatric: His behavior is normal. Thought content normal. His mood appears anxious. His speech is rapid and/or pressured. Cognition and memory are normal. He expresses impulsivity.    Review of Systems  Psychiatric/Behavioral: Positive for depression and substance abuse. Negative for hallucinations, memory loss and suicidal ideas. The patient is nervous/anxious. The patient does not have insomnia.     Blood pressure 120/82, pulse 93, temperature 98.4 F (36.9 C), temperature source Oral, resp. rate 16, SpO2 99 %.There is no height or weight on file to calculate BMI.  General Appearance: Casual  Eye Contact:  Good  Speech:  Clear and Coherent and Pressured  Volume:  Increased  Mood:  Anxious and Irritable  Affect:  Congruent  Thought Process:  Coherent, Goal Directed and Linear  Orientation:  Full (Time, Place, and Person)  Thought Content:  Logical  Suicidal Thoughts:  No  Homicidal Thoughts:  No  Memory:  Immediate;   Good Recent;   Fair Remote;   Fair  Judgement:  Fair  Insight:  Fair  Psychomotor Activity:  Increased  Concentration:  Concentration: Good and Attention Span: Good  Recall:  Good  Fund of Knowledge:  Good  Language:  Good  Akathisia:  No  Handed:  Right  AIMS (if indicated):     Assets:  Communication Skills Desire for Improvement Financial Resources/Insurance Housing Resilience Social Support  ADL's:   Intact  Cognition:  WNL  Sleep:        Treatment Plan Summary: Daily contact with patient to assess and evaluate symptoms and progress in treatment and Medication management (see MAR)  Disposition: Observe overnight and re-evaluate in the AM  Ethelene Hal, NP 09/10/2016 3:44 PM   Patient seen face-to-face for this evaluation, case discussed with the physician extender in treatment team, formulated treatment plan. Reviewed the information documented and agree with the treatment plan.Marland Kitchen  Paitynn Mikus [facility] 09/10/2016 4:17 PM

## 2016-09-11 NOTE — Consult Note (Signed)
[facility] Consult   Reason for Consult: Suicidal ideation Referring Physician:  EDP Patient Identification: Danny Lester MRN:  [id number] Principal Diagnosis: Schizophrenia [facility]) Diagnosis:   Patient Active Problem List   Diagnosis Date Noted  . Schizophrenia ([place]) [F20.9] 12/01/2015    Priority: High  . Noncompliance with medication regimen [Z91.14]   . Herpes simplex [B00.9] 02/11/2016  . Dehydration [E86.0] 12/01/2015  . Leukocytosis [D72.829] 12/01/2015  . 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 Carpal tunnel syndrome, bilateral [G56.03] 09/28/2015  . Neck pain [M54.2] 09/28/2015  . Low back pain [M54.5] 09/28/2015  . Plantar fasciitis, bilateral [M72.2] 09/28/2015  . Dental decay [K02.9] 09/28/2015    Total Time spent with patient: 45 minutes  Subjective:   Danny Lester is a 41 y.o. male patient admitted under involuntary commitment placed by his wife for suicidal ideation.   HPI:  Pt was seen and chart reviewed with Dr Parke Poisson. Pt denies suicidal and homicidal ideation, denies auditory and visual hallucinations, and does not appear to be responding to internal stimuli. Pt was calm and cooperative, alert & oriented x 4, and appropriate for the situation. Pt stated his wife is cheating on him and she made up all these lies to make me stay at the hospital so she can have her boyfriend. Pt is medication compliant and goes to [facility] for therapy and medication management. Pt denies drug and alcohol use; UDS positive for THC and Cocaine, BAL negative. Pt remained calm and pleasant while in the [facility]. Pt is psychiatrically cleared for discharge.   Past Psychiatric History: As above  Risk to Self: None Risk to Others: None Prior Inpatient Therapy: Prior Inpatient Therapy: No Prior Outpatient Therapy: Prior Outpatient Therapy: Yes Prior Therapy Dates: current Prior Therapy Facilty/Provider(s): [facility] Reason for Treatment: Med management  Does patient  have an ACCT team?: No Does patient have Intensive In-House Services?  : No Does patient have [facility] services? : Yes Does patient have P4CC services?: No  Past Medical History:  Past Medical History:  Diagnosis Date  . Anxiety   . Depression   . Multiple personality disorder   . Nerve pain   . PTSD (post-traumatic stress disorder)   . Schizophrenia, paranoid, chronic ([place])     Past Surgical History:  Procedure Laterality Date  . KNEE SURGERY     Family History: No family history on file. Family Psychiatric  History: Unknown Social History:  History  Alcohol Use  . Yes    Comment: beer occasionally     History  Drug Use No    Social History   Social History  . Marital status: Single    Spouse name: N/A  . Number of children: N/A  . Years of education: N/A   Social History Main Topics  . Smoking status: Current Some Day Smoker    Packs/day: 0.30    Types: Cigarettes  . Smokeless tobacco: Never Used  . Alcohol use Yes     Comment: beer occasionally  . Drug use: No  . Sexual activity: Yes    Birth control/ protection: None   Other Topics Concern  . Not on file   Social History Narrative  . No narrative on file   Additional Social History:    Allergies:   Allergies  Allergen Reactions  . Penicillins Hives and Swelling    Swelling of face Has patient had a PCN reaction causing immediate rash, facial/tongue/throat swelling, SOB or lightheadedness with hypotension: No Has patient had a PCN  reaction causing severe rash involving mucus membranes or skin necrosis: Yes Has patient had a PCN reaction that required hospitalization No Has patient had a PCN reaction occurring within the last 10 years: No If all of the above answers are "NO", then may proceed with Cephalosporin use.   Marland Kitchen Risperdal [Risperidone] Other (See Comments)    Per pt: "starts shaking on one side"    Labs:  Results for orders placed or performed during the hospital encounter of 09/09/16  (from the past 48 hour(s))  Comprehensive metabolic panel     Status: Abnormal   Collection Time: 09/09/16  6:33 PM  Result Value Ref Range   Sodium 139 135 - 145 mmol/L   Potassium 3.2 (L) 3.5 - 5.1 mmol/L   Chloride 103 101 - 111 mmol/L   CO2 27 22 - 32 mmol/L   Glucose, Bld 112 (H) 65 - 99 mg/dL   BUN 9 6 - 20 mg/dL   Creatinine, Ser 1.07 0.61 - 1.24 mg/dL   Calcium 9.3 8.9 - 10.3 mg/dL   Total Protein 7.7 6.5 - 8.1 g/dL   Albumin 4.2 3.5 - 5.0 g/dL   AST 40 15 - 41 U/L   ALT 17 17 - 63 U/L   Alkaline Phosphatase 66 38 - 126 U/L   Total Bilirubin 0.7 0.3 - 1.2 mg/dL   GFR calc non Af Amer >60 >60 mL/min   GFR calc Af Amer >60 >60 mL/min    Comment: (NOTE) The eGFR has been calculated using the CKD EPI equation. This calculation has not been validated in all clinical situations. eGFR's persistently <60 mL/min signify possible Chronic Kidney Disease.    Anion gap 9 5 - 15  Ethanol     Status: None   Collection Time: 09/09/16  6:33 PM  Result Value Ref Range   Alcohol, Ethyl (B) <5 <5 mg/dL    Comment:        LOWEST DETECTABLE LIMIT FOR SERUM ALCOHOL IS 5 mg/dL FOR MEDICAL PURPOSES ONLY   CBC with Differential     Status: Abnormal   Collection Time: 09/09/16  6:33 PM  Result Value Ref Range   WBC 11.8 (H) 4.0 - 10.5 K/uL   RBC 4.88 4.22 - 5.81 MIL/uL   Hemoglobin 14.8 13.0 - 17.0 g/dL   HCT 41.9 39.0 - 52.0 %   MCV 85.9 78.0 - 100.0 fL   MCH 30.3 26.0 - 34.0 pg   MCHC 35.3 30.0 - 36.0 g/dL   RDW 14.3 11.5 - 15.5 %   Platelets 200 150 - 400 K/uL   Neutrophils Relative % 63 %   Neutro Abs 7.3 1.7 - 7.7 K/uL   Lymphocytes Relative 28 %   Lymphs Abs 3.3 0.7 - 4.0 K/uL   Monocytes Relative 8 %   Monocytes Absolute 0.9 0.1 - 1.0 K/uL   Eosinophils Relative 1 %   Eosinophils Absolute 0.2 0.0 - 0.7 K/uL   Basophils Relative 0 %   Basophils Absolute 0.0 0.0 - 0.1 K/uL    Current Facility-Administered Medications  Medication Dose Route Frequency Provider Last Rate  Last Dose  . acyclovir (ZOVIRAX) tablet 400 mg  400 mg Oral BID Ethelene Hal, NP   400 mg at 09/11/16 0910  . alum & mag hydroxide-simeth (MAALOX/MYLANTA) 200-200-20 MG/5ML suspension 30 mL  30 mL Oral Q6H PRN Davonna Belling, MD      . baclofen (LIORESAL) tablet 10 mg  10 mg Oral BID Davonna Belling, MD  10 mg at 09/11/16 0910  . divalproex (DEPAKOTE) DR tablet 1,000 mg  1,000 mg Oral q morning - 10a Davonna Belling, MD   1,000 mg at 09/11/16 0910  . divalproex (DEPAKOTE) DR tablet 500 mg  500 mg Oral Lowanda Foster, MD   500 mg at 09/10/16 2122  . gabapentin (NEURONTIN) capsule 900 mg  900 mg Oral TID Davonna Belling, MD   900 mg at 09/11/16 0910  . hydrOXYzine (ATARAX/VISTARIL) tablet 25 mg  25 mg Oral TID PRN Davonna Belling, MD      . ibuprofen (ADVIL,MOTRIN) tablet 600 mg  600 mg Oral Q8H PRN Davonna Belling, MD      . lurasidone (LATUDA) tablet 80 mg  80 mg Oral Lowanda Foster, MD   80 mg at 09/10/16 2121  . ondansetron (ZOFRAN) tablet 4 mg  4 mg Oral Q8H PRN Davonna Belling, MD      . traZODone (DESYREL) tablet 150 mg  150 mg Oral QHS PRN Davonna Belling, MD   150 mg at 09/09/16 2250   Current Outpatient Prescriptions  Medication Sig Dispense Refill  . acyclovir (ZOVIRAX) 400 MG tablet 1 tab by mouth twice daily 60 tablet 11  . baclofen (LIORESAL) 10 MG tablet 1/2 tab by mouth twice daily only as needed (Patient taking differently: 10 mg 2 (two) times daily. ) 30 each 3  . divalproex (DEPAKOTE) 500 MG DR tablet Take 500 mg by mouth. 2 in the am and 1 in the pm    . gabapentin (NEURONTIN) 600 MG tablet 1 1/2 tabs by mouth 3 times daily. 135 tablet 11  . hydrOXYzine (ATARAX/VISTARIL) 25 MG tablet Take 25 mg by mouth 3 (three) times daily as needed.    . lurasidone (LATUDA) 80 MG TABS tablet Take 80 mg by mouth at bedtime.    . traZODone (DESYREL) 150 MG tablet Take 150 mg by mouth at bedtime as needed for sleep.     . Diclofenac Sodium 3 % GEL Apply  twice daily to affected areas (Patient not taking: Reported on 09/09/2016) 100 g 6  . venlafaxine (EFFEXOR) 37.5 MG tablet Take 37.5 mg by mouth 3 (three) times daily with meals.      Musculoskeletal: Strength & Muscle Tone: within normal limits Gait & Station: normal Patient leans: N/A  Psychiatric Specialty Exam: Physical Exam  Constitutional: He is oriented to person, place, and time. He appears well-developed and well-nourished.  Respiratory: Effort normal.  Musculoskeletal: Normal range of motion.  Neurological: He is alert and oriented to person, place, and time.  Psychiatric: He has a normal mood and affect. His speech is normal and behavior is normal. Judgment and thought content normal. Cognition and memory are normal.    Review of Systems  Psychiatric/Behavioral: Positive for depression and substance abuse. Negative for hallucinations, memory loss and suicidal ideas. The patient is nervous/anxious. The patient does not have insomnia.   All other systems reviewed and are negative.   Blood pressure 113/61, pulse (!) 54, temperature 98 F (36.7 C), temperature source Oral, resp. rate 16, SpO2 100 %.There is no height or weight on file to calculate BMI.  General Appearance: Casual  Eye Contact:  Good  Speech:  Clear and Coherent and Normal Rate  Volume:  Normal  Mood:  Anxious and Depressed  Affect:  Congruent and Depressed  Thought Process:  Coherent, Goal Directed and Linear  Orientation:  Full (Time, Place, and Person)  Thought Content:  Logical  Suicidal Thoughts:  No  Homicidal Thoughts:  No  Memory:  Immediate;   Good Recent;   Good Remote;   Fair  Judgement:  Fair  Insight:  Fair  Psychomotor Activity:  Normal  Concentration:  Concentration: Good and Attention Span: Good  Recall:  Good  Fund of Knowledge:  Good  Language:  Good  Akathisia:  No  Handed:  Right  AIMS (if indicated):     Assets:  Communication Skills Desire for Improvement Financial  Resources/Insurance Housing Resilience Social Support  ADL's:  Intact  Cognition:  WNL  Sleep:        Treatment Plan Summary: Plan Discharge Home  Follow up at [facility] for therapy and medication management Avoid the use of alcohol and illicit drugs Take all medications as prescribed  Disposition: No evidence of imminent risk to self or others at present.   Patient does not meet criteria for psychiatric inpatient admission. Supportive therapy provided about ongoing stressors. Discussed crisis plan, support from social network, calling 911, coming to the Emergency Department, and calling Suicide Hotline.  Ethelene Hal, NP 09/11/2016 1:33 PM

## 2016-09-11 NOTE — ED Notes (Signed)
Hourly rounding reveals patient sleeping in room. No complaints, stable, in no acute distress. Q15 minute rounds and monitoring via Security Cameras to continue. 

## 2016-09-11 NOTE — Discharge Instructions (Signed)
For your ongoing mental health needs, you are advised to follow up with [facility].  If you do not have an appointment, new and returning patients are seen at their walk-in clinic.  Walk-in hours are Monday - Friday from 8:00 am - 3:00 pm.  Walk-in patients are seen on a first come, first served basis.  Try to arrive as early as possible for he best chance of being seen the same day: ° °     [facility] °     [address] °     [place], [place] [address] °     [phone] °

## 2016-09-11 NOTE — [facility] Suicide Risk Assessment (Signed)
Suicide Risk Assessment  Discharge Assessment   [facility]   Principal Problem: Schizophrenia [facility](HCC) Discharge Diagnoses:  Patient Active Problem List   Diagnosis Date Noted  . Schizophrenia ([facility]) [F20.9] 12/01/2015    Priority: High  . Noncompliance with medication regimen [Z91.14]   . Herpes simplex [B00.9] 02/11/2016  . Dehydration [E86.0] 12/01/2015  . Leukocytosis [D72.829] 12/01/2015  . 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 Carpal tunnel syndrome, bilateral [G56.03] 09/28/2015  . Neck pain [M54.2] 09/28/2015  . Low back pain [M54.5] 09/28/2015  . Plantar fasciitis, bilateral [M72.2] 09/28/2015  . Dental decay [K02.9] 09/28/2015    Total Time spent with patient: 45 minutes  Musculoskeletal: Strength & Muscle Tone: within normal limits Gait & Station: normal Patient leans: N/A  Psychiatric Specialty Exam: Physical Exam  Constitutional: He is oriented to person, place, and time. He appears well-developed and well-nourished.  Respiratory: Effort normal.  Musculoskeletal: Normal range of motion.  Neurological: He is alert and oriented to person, place, and time.  Psychiatric: He has a normal mood and affect. His speech is normal and behavior is normal. Judgment and thought content normal. Cognition and memory are normal.   Review of Systems  Psychiatric/Behavioral: Positive for depression and substance abuse. Negative for hallucinations, memory loss and suicidal ideas. The patient is nervous/anxious. The patient does not have insomnia.   All other systems reviewed and are negative.  Blood pressure 113/61, pulse (!) 54, temperature 98 F (36.7 C), temperature source Oral, resp. rate 16, SpO2 100 %.There is no height or weight on file to calculate BMI. General Appearance: Casual Eye Contact:  Good Speech:  Clear and Coherent and Normal Rate Volume:  Normal Mood:  Anxious and Depressed Affect:  Congruent and Depressed Thought  Process:  Coherent, Goal Directed and Linear Orientation:  Full (Time, Place, and Person) Thought Content:  Logical Suicidal Thoughts:  No Homicidal Thoughts:  No Memory:  Immediate;   Good Recent;   Good Remote;   Fair Judgement:  Fair Insight:  Fair Psychomotor Activity:  Normal Concentration:  Concentration: Good and Attention Span: Good Recall:  Good Fund of Knowledge:  Good Language:  Good Akathisia:  No Handed:  Right AIMS (if indicated):    Assets:  Communication Skills Desire for Improvement Financial Resources/Insurance Housing Resilience Social Support ADL's:  Intact Cognition:  WNL  Mental Status Per Nursing Assessment::   On Admission:   Aggitataed and anxious  Demographic Factors:  Male and Low socioeconomic status  Loss Factors: Financial problems/change in socioeconomic status  Historical Factors: Impulsivity  Risk Reduction Factors:   Responsible for children under 43 years of age, Sense of responsibility to family, Living with another person, especially a relative, Positive social support and Positive therapeutic relationship  Continued Clinical Symptoms:  Bipolar Disorder:   Bipolar II Depressive phase Alcohol/Substance Abuse/Dependencies Schizophrenia:   Paranoid or undifferentiated type More than one psychiatric diagnosis Previous Psychiatric Diagnoses and Treatments  Cognitive Features That Contribute To Risk:  Closed-mindedness    Suicide Risk:  Minimal: No identifiable suicidal ideation.  Patients presenting with no risk factors but with morbid ruminations; may be classified as minimal risk based on the severity of the depressive symptoms    Plan Of Care/Follow-up recommendations:  Activity:  as tolerated Diet:  Heart healthy  Laveda AbbeLaurie Britton Briley Bumgarner, NP 09/11/2016, 1:45 PM

## 2016-09-11 NOTE — ED Notes (Signed)
Pt discharged ambulatory with discharge instructions.  All belongings were returned to patient.  Pt was calm and cooperative.

## 2016-09-11 NOTE — ED Notes (Signed)
Hourly rounding reveals patient sleeping room. No complaints, stable, in no acute distress. Q15 minute rounds and monitoring via [employer] to continue. 

## 2016-09-11 NOTE — BH Assessment (Signed)
[facility] Assessment Progress Note  Per Nehemiah MassedFernando Cobos, MD, this pt does not require psychiatric hospitalization at this time.  Pt presents under IVC initiated by pt's wife, which Dr Jama Flavorsobos has rescinded.  [facility]  This has been included in pt's discharge instructions.  Pt's nurse, Kendal Hymendie, has been notified.  Doylene Canninghomas Tyeshia Cornforth, MA Triage Specialist [phone]

## 2016-09-12 ENCOUNTER — Ambulatory Visit (HOSPITAL_COMMUNITY)
Admission: RE | Admit: 2016-09-12 | Discharge: 2016-09-12 | Disposition: A | Discharge status: Home / Self Care | Payer: Medicaid Other | Attending: Psychiatry | Admitting: Psychiatry

## 2016-09-12 ENCOUNTER — Encounter (HOSPITAL_COMMUNITY): Payer: Self-pay | Admitting: Emergency Medicine

## 2016-09-12 ENCOUNTER — Emergency Department (HOSPITAL_COMMUNITY)
Admission: EM | Admit: 2016-09-12 | Discharge: 2016-09-13 | Disposition: A | Discharge status: Home / Self Care | Payer: Medicaid Other | Attending: Emergency Medicine | Admitting: Emergency Medicine

## 2016-09-12 DIAGNOSIS — F209 Schizophrenia, unspecified: Secondary | ICD-10-CM | POA: Diagnosis not present

## 2016-09-12 DIAGNOSIS — Z79899 Other long term (current) drug therapy: Secondary | ICD-10-CM | POA: Insufficient documentation

## 2016-09-12 DIAGNOSIS — R4589 Other symptoms and signs involving emotional state: Secondary | ICD-10-CM | POA: Diagnosis not present

## 2016-09-12 DIAGNOSIS — R45850 Homicidal ideations: Secondary | ICD-10-CM | POA: Insufficient documentation

## 2016-09-12 DIAGNOSIS — F200 Paranoid schizophrenia: Secondary | ICD-10-CM

## 2016-09-12 DIAGNOSIS — F17210 Nicotine dependence, cigarettes, uncomplicated: Secondary | ICD-10-CM | POA: Insufficient documentation

## 2016-09-12 NOTE — ED Notes (Signed)
Bed: WLPT2 Expected date:  Expected time:  Means of arrival:  Comments: 

## 2016-09-12 NOTE — H&P (Signed)
Behavioral Health Medical Screening Exam  Danny Lester is an 41 y.o. male [facility] homicidal ideations towards his wife and the man that he thinks she is cheating with. Reports history of PTSD, Bipolar, and Paranoid Schizophrenia.Reports that he spent 16 years in prison for attempted murder and was released about a year ago. Since that time [facility]. Denies suicidal ideations. Denies current audiovisual hallucinations, but reports history of visual hallucinations of his deceased grandmother. Reports that he has three misdemeanor cases pending.   Total Time spent with patient: 30 minutes  Psychiatric Specialty Exam: Physical Exam  Constitutional: He is oriented to person, place, and time. He appears well-developed and well-nourished. No distress.  HENT:  Head: Normocephalic.  Right Ear: External ear normal.  Left Ear: External ear normal.  Eyes: Conjunctivae are normal. Right eye exhibits no discharge. Left eye exhibits no discharge. No scleral icterus.  Cardiovascular: Normal rate, regular rhythm and normal heart sounds.   Respiratory: Effort normal and breath sounds normal. No respiratory distress.  Musculoskeletal: Normal range of motion.  Neurological: He is alert and oriented to person, place, and time.  Skin: Skin is warm and dry. He is not diaphoretic.  Psychiatric: His mood appears anxious. His affect is blunt. His affect is not labile. He is withdrawn. He is not actively hallucinating. Thought content is paranoid. Thought content is not delusional. Cognition and memory are normal. He expresses impulsivity and inappropriate judgment. He exhibits a depressed mood. He expresses homicidal ideation. He expresses no suicidal ideation.    Review of Systems  Psychiatric/Behavioral: Positive for depression, hallucinations and substance abuse. Negative for memory loss and suicidal ideas. The patient is nervous/anxious and has insomnia.   All other systems  reviewed and are negative.   There were no vitals taken for this visit.There is no height or weight on file to calculate BMI.  General Appearance: Disheveled  Eye Contact:  Fair  Speech:  Clear and Coherent and Slow  Volume:  Normal  Mood:  Anxious, Depressed, Hopeless, Irritable and Worthless  Affect:  Blunt  Thought Process:  Coherent  Orientation:  Full (Time, Place, and Person)  Thought Content:  Hallucinations: None, Paranoid Ideation and Rumination  Suicidal Thoughts:  No  Homicidal Thoughts:  Yes.  without intent/plan  Memory:  Immediate;   Good Recent;   Fair Remote;   Fair  Judgement:  Impaired  Insight:  Fair  Psychomotor Activity:  Normal  Concentration: Concentration: Fair and Attention Span: Fair  Recall:  [employer]Fair  Fund of Knowledge:Fair  Language: Good  Akathisia:  No  Handed:  Right  AIMS (if indicated):     Assets:  Communication Skills Desire for Improvement Leisure Time Physical Health  Sleep:       Musculoskeletal: Strength & Muscle Tone: within normal limits Gait & Station: normal   Blood pressure 119/77, pulse 67, temperature 98.6 F (37 C), resp. rate 16, SpO2 98 %.  Recommendations:  Based on my evaluation the patient does not appear to have an emergency medical condition.   Jackelyn PolingJason A Berry, NP 09/12/2016, 11:15 PM

## 2016-09-12 NOTE — BH Assessment (Signed)
Assessment Note  Danny Lester is an 41 y.o.married male, who voluntarily came into [facility], alone. Patient stated seeking treatment due to homicidal ideations. Patient reports homicidal ideations towards an unknown individual that he believes is involved in an adulterous affair with his wife.  Patient denies having a plan associated with his HI.  Patient stated that he attempted to return home to the residency with his wife and saw the individual, that he believed was having a relationship with his wife, resulting in him chasing him, but being unable to caught him.  Patient reported occasional use of cannabis, but was unsure about frequency and duration.  Per medical labs (09/09/2016), Patient was positive for Tetrahydrocannabinol and Cocaine.  Patient reported ongoing experiences with depressive symptoms, such as despondency, fatigue, isolation, tearfulness, feelings of worthlessness, guilt, loss of interest in previously enjoyable activities, anger, and recurrent thoughts of his wife possible cheating.  Patient denies suicidal ideations, auditory/visual hallucinations, self-injurious behaviors, substance use, or access to weapons.  Per medical records, Patient was discharged from [facility] ED, on 09/11/2016, with principal problem of Schizophrenia.    Patient reported currently having no residency, due to a disagreement with his wife, resulting in her request for him to leave the home. Patient reported being incarnated for 16 years, for attempted murder, and being released in 2017.  Patient reported having a court date, on 10/11/2016 for misuse of a motor vehicle, and 2 larceny charges. Patient denies history or current physical, sexual, verbal abuse.  Patient reported family history of substance abuse on paternal and maternal sides.  Patient stated having no history of inpatient treatment for mental health or substance abuse.  Patient reported currently receiving outpatient services at [facility] for medical  management.    During assessment, Patient was calm and cooperative.  Patient was dressed in her personal clothing, however presented a body odor and appeared to be disheveled.  Patient was oriented to the person, situation, person, place, and situation. Patient's eye contact was fair.  Patient's motor activity consisted of freedom of movement.  Patient's speech was incoherent, slow, slurred.  Patient's level of consciousness was alert.   Patient's mood appeared to be depressed, anxious, worthless, with feelings of low self-esteem.  Patient's affect was anxious and depressed.   Patient's thought process was circumstantial and tangential.  Patient's judgment appeared to be partially impaired.     Diagnosis: Schizophrenia, paranoid, chronic, per medical history Post-traumatic Stress Disorder, per medical history  Past Medical History:  Past Medical History:  Diagnosis Date  . Anxiety   . Depression   . Multiple personality disorder   . Nerve pain   . PTSD (post-traumatic stress disorder)   . Schizophrenia, paranoid, chronic (HCC)     Past Surgical History:  Procedure Laterality Date  . KNEE SURGERY      Family History: No family history on file.  Social History:  reports that he has been smoking Cigarettes.  He has been smoking about 0.30 packs per day. He has never used smokeless tobacco. He reports that he drinks alcohol. He reports that he does not use drugs.  Additional Social History:  Alcohol / Drug Use Pain Medications: See MAR Prescriptions: See MAR Over the Counter: See MAR History of alcohol / drug use?: Yes Longest period of sobriety (when/how long): Pt reports 16 years Negative Consequences of Use: Legal, Personal relationships Substance #1 Name of Substance 1: Cannabis 1 - Age of First Use: "14 or 15" 1 - Amount (size/oz): Unknown 1 -  Frequency: Unknown 1 - Duration: Unknown 1 - Last Use / Amount: "6-7 days ago"  CIWA:   COWS:    Allergies:  Allergies   Allergen Reactions  . Penicillins Hives and Swelling    Swelling of face Has patient had a PCN reaction causing immediate rash, facial/tongue/throat swelling, SOB or lightheadedness with hypotension: No Has patient had a PCN reaction causing severe rash involving mucus membranes or skin necrosis: Yes Has patient had a PCN reaction that required hospitalization No Has patient had a PCN reaction occurring within the last 10 years: No If all of the above answers are "NO", then may proceed with Cephalosporin use.   Marland Kitchen. Risperdal [Risperidone] Other (See Comments)    Per pt: "starts shaking on one side"    Home Medications:  (Not in a hospital admission)  OB/GYN Status:  No LMP for male patient.  General Assessment Data [facility] T[facility] Assessment: In system Is this a Tele or Face-to-Face Assessment?: Face-to-Face Is this an Initial Assessment or a Re-assessment for this encounter?: Initial Assessment Marital status: Married Is patient pregnant?: No Pregnancy Status: No Living Arrangements: Spouse/significant other Can pt return to current living arrangement?: No Admission Status: Voluntary Is patient capable of signing voluntary admission?: Yes Referral Source: Self/Family/Friend Insurance type: Medicaid  Medical Screening Exam [facility] Walk-in ONLY) Medical Exam completed: Yes  Crisis Care Plan Living Arrangements: Spouse/significant other Legal Guardian: Other: (Self) Name of Psychiatrist: [facility] Name of Therapist: [facility]  Education Status Is patient currently in school?: No Current Grade: N/A Highest grade of school patient has completed: GED Name of school: N/A Contact person: N/A  Risk to self with the past 6 months Suicidal Ideation: No (Patient denies.) Has patient been a risk to self within the past 6 months prior to admission? : Yes Suicidal Intent: No (Patient denies.) Has patient had any suicidal intent within the past 6  months prior to admission? : Yes Is patient at risk for suicide?: No (Patient denies.) Suicidal Plan?: No Has patient had any suicidal plan within the past 6 months prior to admission? : No Access to Means: No (Patient denies.) What has been your use of drugs/alcohol within the last 12 months?: Pt. reports Cannabis and Alcohol. Per medical records (09/09/2016) Cocaine Previous Attempts/Gestures: No (Patient denies.) How many times?: 0 Other Self Harm Risks: Patient denies. Triggers for Past Attempts: None known Intentional Self Injurious Behavior: None Family Suicide History: No Recent stressful life event(s): Legal Issues, Conflict (Comment), Trauma (Comment) (Pt. reports conflict with his wife.) Persecutory voices/beliefs?: No Depression: Yes Depression Symptoms: Fatigue, Loss of interest in usual pleasures, Feeling angry/irritable, Insomnia, Feeling worthless/self pity, Isolating Substance abuse history and/or treatment for substance abuse?: Yes Suicide prevention information given to non-admitted patients: Not applicable  Risk to Others within the past 6 months Homicidal Ideation: Yes-Currently Present Does patient have any lifetime risk of violence toward others beyond the six months prior to admission? : Yes (comment) Thoughts of Harm to Others: Yes-Currently Present Comment - Thoughts of Harm to Others: Pt. reports thoughts of wanting to harm his wife. Current Homicidal Intent: No Current Homicidal Plan: No Access to Homicidal Means: No (Patient denies.) Identified Victim: Patient reports his wife. History of harm to others?: Yes Assessment of Violence: On admission Violent Behavior Description: Patient reports incarceration Does patient have access to weapons?: No (Patient denies.Marland Kitchen.) Criminal Charges Pending?: Yes Describe Pending Criminal Charges: Patient reprots misuse of a motor vechile & 2 larceny charges. Does patient  have a court date: Yes Court Date: 10/11/16 Is  patient on probation?: No (Patient denies.)  Psychosis Hallucinations: None noted Delusions: None noted  Mental Status Report Appearance/Hygiene: Disheveled, Body odor Eye Contact: Fair Motor Activity: Freedom of movement Speech: Incoherent, Slow, Slurred Level of Consciousness: Alert Mood: Depressed, Anxious, Worthless, low self-esteem Affect: Anxious, Depressed Anxiety Level: Moderate Thought Processes: Circumstantial, Tangential Judgement: Partial Orientation: Person, Place, Time, Situation Obsessive Compulsive Thoughts/Behaviors: None  Cognitive Functioning Concentration: Poor Memory: Recent Intact, Remote Intact IQ: Average Insight: Poor Impulse Control: Poor Appetite: Fair Weight Loss: 10 Weight Gain: 0 Sleep: Decreased Total Hours of Sleep: 6 Vegetative Symptoms: None  ADLScreening [facility] Assessment Services) Patient's cognitive ability adequate to safely complete daily activities?: Yes Patient able to express need for assistance with ADLs?: Yes Independently performs ADLs?: Yes (appropriate for developmental age)  Prior Inpatient Therapy Prior Inpatient Therapy: No Prior Therapy Dates: None Prior Therapy Facilty/Provider(s): None Reason for Treatment: None  Prior Outpatient Therapy Prior Outpatient Therapy: Yes Prior Therapy Dates: current Prior Therapy Facilty/Provider(s): [facility] Reason for Treatment: Med management  Does patient have an ACCT team?: No Does patient have Intensive In-House Services?  : No Does patient have [facility] services? : Yes Does patient have P4CC services?: No  ADL Screening (condition at time of admission) Patient's cognitive ability adequate to safely complete daily activities?: Yes Is the patient deaf or have difficulty hearing?: No Does the patient have difficulty seeing, even when wearing glasses/contacts?: No Does the patient have difficulty concentrating, remembering, or making decisions?: No Patient able to express need  for assistance with ADLs?: Yes Does the patient have difficulty dressing or bathing?: No Independently performs ADLs?: Yes (appropriate for developmental age) Does the patient have difficulty walking or climbing stairs?: No Weakness of Legs: None Weakness of Arms/Hands: None  Home Assistive Devices/Equipment Home Assistive Devices/Equipment: None    Abuse/Neglect Assessment (Assessment to be complete while patient is alone) Physical Abuse: Denies Verbal Abuse: Denies Sexual Abuse: Denies Exploitation of patient/patient's resources: Denies Self-Neglect: Denies     Merchant navy officer (For Healthcare) Does Patient Have a Medical Advance Directive?: No Would patient like information on creating a medical advance directive?: No - Patient declined    Additional Information 1:1 In Past 12 Months?: No CIRT Risk: Yes Elopement Risk: No Does patient have medical clearance?: No     Disposition:  Disposition Initial Assessment Completed for this Encounter: Yes Disposition of Patient: Inpatient treatment program (Per Nira Conn, NP) Type of inpatient treatment program: Adult  On Site Evaluation by:   Reviewed with Physician:    Talbert Nan 09/12/2016 10:41 PM

## 2016-09-12 NOTE — ED Notes (Signed)
Pt does not want me to get his blood.

## 2016-09-12 NOTE — ED Triage Notes (Signed)
Patient brought in by [employer]. Patient called due to him having SI. Patient is having thoughts of harming people.

## 2016-09-13 DIAGNOSIS — F17210 Nicotine dependence, cigarettes, uncomplicated: Secondary | ICD-10-CM

## 2016-09-13 DIAGNOSIS — F209 Schizophrenia, unspecified: Secondary | ICD-10-CM | POA: Diagnosis not present

## 2016-09-13 DIAGNOSIS — R45850 Homicidal ideations: Secondary | ICD-10-CM | POA: Diagnosis not present

## 2016-09-13 LAB — RAPID URINE DRUG SCREEN, HOSP PERFORMED
Amphetamines: NOT DETECTED
Barbiturates: NOT DETECTED
Benzodiazepines: NOT DETECTED
COCAINE: NOT DETECTED
OPIATES: NOT DETECTED
Tetrahydrocannabinol: POSITIVE — AB

## 2016-09-13 LAB — COMPREHENSIVE METABOLIC PANEL
ALT: 17 U/L (ref 17–63)
AST: 26 U/L (ref 15–41)
Albumin: 3.6 g/dL (ref 3.5–5.0)
Alkaline Phosphatase: 62 U/L (ref 38–126)
Anion gap: 7 (ref 5–15)
BUN: 19 mg/dL (ref 6–20)
CO2: 27 mmol/L (ref 22–32)
CREATININE: 0.98 mg/dL (ref 0.61–1.24)
Calcium: 8.7 mg/dL — ABNORMAL LOW (ref 8.9–10.3)
Chloride: 103 mmol/L (ref 101–111)
Glucose, Bld: 91 mg/dL (ref 65–99)
POTASSIUM: 3.8 mmol/L (ref 3.5–5.1)
Sodium: 137 mmol/L (ref 135–145)
TOTAL PROTEIN: 6.7 g/dL (ref 6.5–8.1)
Total Bilirubin: 0.8 mg/dL (ref 0.3–1.2)

## 2016-09-13 LAB — CBC
HCT: 39.9 % (ref 39.0–52.0)
Hemoglobin: 13.7 g/dL (ref 13.0–17.0)
MCH: 30 pg (ref 26.0–34.0)
MCHC: 34.3 g/dL (ref 30.0–36.0)
MCV: 87.5 fL (ref 78.0–100.0)
PLATELETS: 170 10*3/uL (ref 150–400)
RBC: 4.56 MIL/uL (ref 4.22–5.81)
RDW: 14.3 % (ref 11.5–15.5)
WBC: 9.7 10*3/uL (ref 4.0–10.5)

## 2016-09-13 LAB — ETHANOL: Alcohol, Ethyl (B): <5 mg/dL (ref <5)

## 2016-09-13 LAB — SALICYLATE LEVEL

## 2016-09-13 LAB — ACETAMINOPHEN LEVEL: Acetaminophen (Tylenol), Serum: <10 ug/mL — ABNORMAL LOW (ref 10–30)

## 2016-09-13 MED ORDER — DIVALPROEX SODIUM 500 MG PO DR TAB
500.0000 mg | DELAYED_RELEASE_TABLET | Freq: Two times a day (BID) | ORAL | Status: DC
  Administered 2016-09-13: 500 mg via ORAL
  Filled 2016-09-13: qty 1

## 2016-09-13 MED ORDER — GABAPENTIN 300 MG PO CAPS
600.0000 mg | ORAL_CAPSULE | Freq: Three times a day (TID) | ORAL | Status: DC
  Administered 2016-09-13: 600 mg via ORAL
  Filled 2016-09-13: qty 2

## 2016-09-13 MED ORDER — HYDROXYZINE HCL 25 MG PO TABS: 25.0000 mg | ORAL_TABLET | Freq: Three times a day (TID) | ORAL | Status: DC | PRN

## 2016-09-13 MED ORDER — TRAZODONE HCL 50 MG PO TABS: 150.0000 mg | ORAL_TABLET | Freq: Every evening | ORAL | Status: DC | PRN

## 2016-09-13 MED ORDER — LURASIDONE HCL 40 MG PO TABS: 80.0000 mg | ORAL_TABLET | Freq: Every day | ORAL | Status: DC

## 2016-09-13 NOTE — BH Assessment (Signed)
[facility] Assessment Progress Note   Per Mojeed Akintayo, MD, this pt does not require psychiatric hospitalization at this time.  Pt is to be discharged from [facility] with recommendation to follow up with [facility].  This has been included in pt's discharge instructions.  Pt's nurse, Ashley, has been notified.  Nimsi Males, MA Triage Specialist [phone]      

## 2016-09-13 NOTE — ED Notes (Signed)
Pt taking a shower 

## 2016-09-13 NOTE — Discharge Instructions (Signed)
For your ongoing mental health needs, you are advised to follow up with [facility].  New and returning patients are seen at their walk-in clinic.  Walk-in hours are Monday - Friday from 8:00 am - 3:00 pm.  Walk-in patients are seen on a first come, first served basis.  Try to arrive as early as possible for he best chance of being seen the same day: ° °     [facility] °     [address] °     [place], [place] [address] °     [phone] °

## 2016-09-13 NOTE — ED Notes (Signed)
Urine sample collected and sent to lab.

## 2016-09-13 NOTE — Progress Notes (Signed)
CSW spoke with patient regarding discharge plans. Patient [place] and requested "funds" to get back home. CSW informed patient that bus pass could be provided and [facility]. [facility] and contact family for additional help. CSW updated RN.   Stacy GardnerErin Ason Heslin, [facility] Emergency Room Clinical Social Worker [phone]

## 2016-09-13 NOTE — BHH Counselor (Signed)
Clinician expressed to GlenfieldLisa, [place] and Victorino DikeJennifer, Press photographerCharge Nurse [facility]. Pt's disposition was,  inpatient treatment, TTS to seek placement.   Redmond Pullingreylese D Geary Rufo, MS, [facility], [facility] Triage Specialist [phone]

## 2016-09-13 NOTE — B[facility] Suicide Risk Assessment (Signed)
Suicide Risk Assessment  Discharge Assessment   [facility]   Principal Problem: Schizophrenia [facility](HCC) Discharge Diagnoses:  Patient Active Problem List   Diagnosis Date Noted  . Schizophrenia (HCC) [F20.9] 12/01/2015    Priority: High  . Noncompliance with medication regimen [Z91.14]   . Herpes simplex [B00.9] 02/11/2016  . Dehydration [E86.0] 12/01/2015  . Leukocytosis [D72.829] 12/01/2015  . 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 Carpal tunnel syndrome, bilateral [G56.03] 09/28/2015  . Neck pain [M54.2] 09/28/2015  . Low back pain [M54.5] 09/28/2015  . Plantar fasciitis, bilateral [M72.2] 09/28/2015  . Dental decay [K02.9] 09/28/2015    Total Time spent with patient: 45 minutes  Musculoskeletal: Strength & Muscle Tone: within normal limits Gait & Station: normal Patient leans: N/A  Psychiatric Specialty Exam: Physical Exam  Constitutional: He is oriented to person, place, and time. He appears well-developed and well-nourished.  Respiratory: Effort normal.  Musculoskeletal: Normal range of motion.  Neurological: He is alert and oriented to person, place, and time.  Psychiatric: He has a normal mood and affect. His speech is normal and behavior is normal. Judgment and thought content normal. Cognition and memory are normal.   Review of Systems  Psychiatric/Behavioral: Positive for depression and substance abuse. Negative for hallucinations, memory loss and suicidal ideas. The patient is nervous/anxious. The patient does not have insomnia.   All other systems reviewed and are negative.  Blood pressure 115/66, pulse 91, temperature 98.6 F (37 C), temperature source Oral, resp. rate 17, height 5\' 7"  (1.702 m), weight 76.7 kg (169 lb), SpO2 98 %.Body mass index is 26.47 kg/m. General Appearance: Casual Eye Contact:  Good Speech:  Clear and Coherent and Normal Rate Volume:  Normal Mood:  Anxious and Depressed Affect:  Congruent and  Depressed Thought Process:  Coherent, Goal Directed and Linear Orientation:  Full (Time, Place, and Person) Thought Content:  Logical Suicidal Thoughts:  No Homicidal Thoughts:  No Memory:  Immediate;   Good Recent;   Good Remote;   Fair Judgement:  Fair Insight:  Fair Psychomotor Activity:  Normal Concentration:  Concentration: Good and Attention Span: Good Recall:  Good Fund of Knowledge:  Good Language:  Good Akathisia:  No Handed:  Right AIMS (if indicated):    Assets:  Communication Skills Desire for Improvement Financial Resources/Insurance Housing Resilience Social Support ADL's:  Intact Cognition:  WNL  Mental Status Per Nursing Assessment::   On Admission:   Homicidal ideation  Demographic Factors:  Male, Low socioeconomic status and Unemployed  Loss Factors: Financial problems/change in socioeconomic status  Historical Factors: Impulsivity  Risk Reduction Factors:   Responsible for children under 43 years of age and Sense of responsibility to family  Continued Clinical Symptoms:  Bipolar Disorder:   Mixed State Depression:   Impulsivity Schizophrenia:   Paranoid or undifferentiated type More than one psychiatric diagnosis Previous Psychiatric Diagnoses and Treatments  Cognitive Features That Contribute To Risk:  Closed-mindedness    Suicide Risk:  Minimal: No identifiable suicidal ideation.  Patients presenting with no risk factors but with morbid ruminations; may be classified as minimal risk based on the severity of the depressive symptoms    Plan Of Care/Follow-up recommendations:  Activity:  as tolerated Diet:  Heart Healthy  Laveda AbbeLaurie Britton Gerhardt Gleed, NP 09/13/2016, 12:29 PM
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
## 2016-09-13 NOTE — ED Notes (Signed)
[facility] and they will find placement
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
## 2016-09-13 NOTE — ED Notes (Signed)
Pt d/c home per MD order. Discharge summary reviewed with pt. Pt verbalizes understanding. Pt denies SI/HI/AVH. Pt signed for personal property and property returned. Pt signed e-signature. Ambulatory off unit with [facility].  

## 2016-09-15 ENCOUNTER — Ambulatory Visit: Payer: Medicaid Other | Admitting: Internal Medicine

## 2016-11-20 ENCOUNTER — Ambulatory Visit: Payer: Medicaid Other | Admitting: Internal Medicine

## 2016-12-04 ENCOUNTER — Encounter: Payer: Self-pay | Admitting: Internal Medicine

## 2016-12-04 ENCOUNTER — Ambulatory Visit (INDEPENDENT_AMBULATORY_CARE_PROVIDER_SITE_OTHER): Payer: Medicaid Other | Admitting: Internal Medicine

## 2016-12-04 VITALS — BP 122/88 | HR 72 | Temp 98.4°F | Resp 12 | Ht 67.0 in | Wt 168.0 lb

## 2016-12-04 DIAGNOSIS — Z13220 Encounter for screening for lipoid disorders: Secondary | ICD-10-CM

## 2016-12-04 DIAGNOSIS — M546 Pain in thoracic spine: Secondary | ICD-10-CM

## 2016-12-04 DIAGNOSIS — J0140 Acute pansinusitis, unspecified: Secondary | ICD-10-CM

## 2016-12-04 MED ORDER — AZITHROMYCIN 250 MG PO TABS
ORAL_TABLET | ORAL | 0 refills | Status: DC
Start: 2016-12-04 — End: 2017-01-06

## 2016-12-04 MED ORDER — BACLOFEN 10 MG PO TABS: ORAL_TABLET | ORAL | 3 refills | Status: AC

## 2016-12-04 NOTE — Progress Notes (Signed)
   Subjective:    Patient ID: Danny Lester, male    DOB: 1975-01-23, 41 y.o.   MRN: [id number]  HPI   1.  Chronic Back Pain:  Still bothering him. in between scapula.  Would be willing to [facility].  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  He is wearing uncomfortable boot.  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   Is somewhat limited by cost for Baclofen/Gabapentin.  2.  Possibly has the flu:  States started with irritation to throat, followed by cough productive of now brownish sputum.  He thinks his symptoms started just under a week ago.  Everyone in the house ill with this.  Felt ot 2 days ago, but has not taken his temp.  Does not feel he has been hot since 2 days ago.  + body aches.  Couldn't get out of bed 2 days ago--was so cold.   Did have some vomiting yesterday and day before.  Poor appetite.  Did have diarrhea also to mild degree.  That is improving.   Drinking fluids okay today.  + Headache and stuffy nose. Nothing for fever today.    3.  Social:  Medicaid will run out end of month and he is reapplying.  Do not really understand what happened.  He is also back living with his wife.   Current Meds  Medication Sig  . acyclovir (ZOVIRAX) 400 MG tablet 1 tab by mouth twice daily  . baclofen (LIORESAL) 10 MG tablet 1/2 tab by mouth twice daily only as needed  . divalproex (DEPAKOTE) 500 MG DR tablet Take 500 mg by mouth. 2 in the am and 1 in the pm  . gabapentin (NEURONTIN) 600 MG tablet 1 1/2 tabs by mouth 3 times daily.  . hydrOXYzine (ATARAX/VISTARIL) 25 MG tablet Take 25 mg by mouth 3 (three) times daily as needed.  . lurasidone (LATUDA) 80 MG TABS tablet Take 80 mg by mouth at bedtime.  . traZODone (DESYREL) 150 MG tablet Take 150 mg by mouth at bedtime as needed for sleep.   Marland Kitchen. venlafaxine (EFFEXOR) 75 MG tablet Take 75 mg by mouth daily.  . [DISCONTINUED] baclofen (LIORESAL) 10 MG tablet  1/2 tab by mouth twice daily only as needed     Review of Systems     Objective:   Physical Exam  NAD Congested cough HEENT:  PERRL, EOMI, TMs pearly gray, throat without injection.  Tender over frontal and maxillary sinuses bilaterally Neck:  Supple, No adenopathy Chest:  CTA CV:  RRR without murmur or rub, radial pulses normal and equal Abd:  S, NT, NO HSM or mass, + BS Skin:  No rash         Assessment & Plan:  1.  Acute sinusitis:  Zpack.  Push fluids.  2.  Chronic back pain:  While out of medicaid, will refer to [facility].  Refilled muscle relaxant, baclofen.  3.  Social:  Discussed if gets turned down for Medicaid, should notify us and apply for orange card

## 2016-12-05 LAB — LIPID PANEL W/O CHOL/HDL RATIO
CHOLESTEROL TOTAL: 201 mg/dL — AB (ref 100–199)
HDL: 58 mg/dL (ref >39)
LDL Calculated: 129 mg/dL — ABNORMAL HIGH (ref 0–99)
Triglycerides: 70 mg/dL (ref 0–149)
VLDL CHOLESTEROL CAL: 14 mg/dL (ref 5–40)

## 2016-12-26 ENCOUNTER — Other Ambulatory Visit: Payer: Self-pay

## 2016-12-26 ENCOUNTER — Emergency Department (HOSPITAL_COMMUNITY)
Admission: EM | Admit: 2016-12-26 | Discharge: 2016-12-27 | Disposition: A | Discharge status: Home / Self Care | Payer: Medicaid Other | Attending: Emergency Medicine | Admitting: Emergency Medicine

## 2016-12-26 DIAGNOSIS — M7918 Myalgia, other site: Secondary | ICD-10-CM | POA: Insufficient documentation

## 2016-12-26 DIAGNOSIS — R112 Nausea with vomiting, unspecified: Secondary | ICD-10-CM | POA: Insufficient documentation

## 2016-12-26 DIAGNOSIS — R05 Cough: Secondary | ICD-10-CM | POA: Insufficient documentation

## 2016-12-26 DIAGNOSIS — R6883 Chills (without fever): Secondary | ICD-10-CM | POA: Insufficient documentation

## 2016-12-26 DIAGNOSIS — R110 Nausea: Secondary | ICD-10-CM | POA: Insufficient documentation

## 2016-12-26 DIAGNOSIS — F17210 Nicotine dependence, cigarettes, uncomplicated: Secondary | ICD-10-CM | POA: Insufficient documentation

## 2016-12-26 DIAGNOSIS — R6889 Other general symptoms and signs: Secondary | ICD-10-CM

## 2016-12-26 MED ORDER — IBUPROFEN 600 MG PO TABS: 600.0000 mg | ORAL_TABLET | Freq: Four times a day (QID) | ORAL | 0 refills | Status: DC | PRN

## 2016-12-26 MED ORDER — IBUPROFEN 200 MG PO TABS
600.0000 mg | ORAL_TABLET | Freq: Once | ORAL | Status: AC
  Administered 2016-12-26: 600 mg via ORAL
  Filled 2016-12-26: qty 3

## 2016-12-26 NOTE — Discharge Instructions (Signed)
Push Fluids without caffeine - juice, water, gatorade. Rest, take ibuprofen every 8 hours for aches and if any fever develops.

## 2016-12-26 NOTE — ED Triage Notes (Signed)
Pt is alert and oriented x 4 and is verbally responsive Pt reports that he has had a productive cough with yellow phlegm. Pt reports chills and body aches. Pt does reports some nausea and vomiting x 1 episode this am.

## 2016-12-26 NOTE — ED Provider Notes (Signed)
[facility]-EMERGENCY DEPT Provider Note   CSN: [id number] Arrival date & time: 12/26/16  2146     History   Chief Complaint Chief Complaint  Patient presents with  . flu symptoms    HPI Danny Lester is a 41 y.o. male.  Patient with no significant medical history presents with body aches, subjective fever, nausea, rigors, cough and congestion for 2 days. He has no appetite but is drinking some fluids. He has tried Thera-Flu for symptoms without resolution or relief. No sick contacts.    The history is provided by the patient. No language interpreter was used.    Past Medical History:  Diagnosis Date  . Anxiety   . Depression   . 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 Nerve pain   . PTSD (post-traumatic stress disorder)   . Schizophrenia, paranoid, chronic [facility](HCC)     Patient Active Problem List   Diagnosis Date Noted  . Noncompliance with medication regimen   . Herpes simplex 02/11/2016  . Dehydration 12/01/2015  . Leukocytosis 12/01/2015  . Schizophrenia ([facility]) 12/01/2015  . 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 Carpal tunnel syndrome, bilateral 09/28/2015  . Neck pain 09/28/2015  . Low back pain 09/28/2015  . Plantar fasciitis, bilateral 09/28/2015  . Dental decay 09/28/2015    Past Surgical History:  Procedure Laterality Date  . KNEE SURGERY         Home Medications    Prior to Admission medications   Medication Sig Start Date End Date Taking? Authorizing Provider  acyclovir (ZOVIRAX) 400 MG tablet 1 tab by mouth twice daily 02/11/16   Julieanne MansonMulberry, Elizabeth, MD  azithromycin [facility](ZITHROMAX) 250 MG tablet 2 tabs by mouth today, then 1 tab daily for 4 more days 12/04/16   Julieanne MansonMulberry, Elizabeth, MD  baclofen (LIORESAL) 10 MG tablet 1/2 tab by mouth twice daily only as needed 12/04/16   Julieanne MansonMulberry, Elizabeth, MD  Diclofenac Sodium 3 % GEL Apply twice daily to affected areas Patient not taking: Reported on 09/09/2016 06/19/16   Julieanne MansonMulberry,  Elizabeth, MD  divalproex (DEPAKOTE) 500 MG DR tablet Take 500 mg by mouth. 2 in the am and 1 in the pm    [provider]  gabapentin (NEURONTIN) 600 MG tablet 1 1/2 tabs by mouth 3 times daily. 08/23/16   Julieanne MansonMulberry, Elizabeth, MD  hydrOXYzine (ATARAX/VISTARIL) 25 MG tablet Take 25 mg by mouth 3 (three) times daily as needed.    [provider]  ibuprofen (ADVIL,MOTRIN) 600 MG tablet Take 1 tablet (600 mg total) by mouth every 6 (six) hours as needed. 12/26/16   Elpidio AnisUpstill, Gavino Fouch, PA-C  lurasidone (LATUDA) 80 MG TABS tablet Take 80 mg by mouth at bedtime.    [provider]  traZODone (DESYREL) 150 MG tablet Take 150 mg by mouth at bedtime as needed for sleep.     [provider]  venlafaxine (EFFEXOR) 75 MG tablet Take 75 mg by mouth daily.    [provider]    Family History No family history on file.  Social History Social History   Tobacco Use  . Smoking status: Current Some Day Smoker    Packs/day: 0.30    Types: Cigarettes  . Smokeless tobacco: Never Used  Substance Use Topics  . Alcohol use: Yes    Comment: beer occasionally  . Drug use: No     Allergies   Penicillins and Risperdal [risperidone]   Review of Systems Review of Systems  Constitutional: Positive for appetite change and  chills.  HENT: Positive for congestion. Negative for sore throat.   Respiratory: Positive for cough. Negative for shortness of breath.   Cardiovascular: Negative.   Gastrointestinal: Positive for nausea and vomiting. Negative for abdominal pain and diarrhea.  Genitourinary: Positive for decreased urine volume.       He reports his urine is decreased and appears dark.   Musculoskeletal: Positive for myalgias. Negative for neck stiffness.  Skin: Negative.  Negative for rash.  Neurological: Negative.  Negative for headaches.     Physical Exam Updated Vital Signs BP 117/73 (BP Location: Left Arm)   Pulse (!) 119   Temp 99 F (37.2 C) (Oral)    Resp 18   Ht 5\' 7"  (1.702 m)   Wt 72.6 kg (160 lb)   SpO2 96%   BMI 25.06 kg/m   Physical Exam  Constitutional: He is oriented to person, place, and time. He appears well-developed and well-nourished.  HENT:  Head: Normocephalic.  Nose: Nose normal.  Mouth/Throat: Oropharynx is clear and moist.  Neck: Normal range of motion. Neck supple.  Cardiovascular: Normal rate and regular rhythm.  No murmur heard. Pulmonary/Chest: Effort normal and breath sounds normal. He has no wheezes. He has no rales.  Abdominal: Soft. Bowel sounds are normal. There is no tenderness. There is no rebound and no guarding.  Musculoskeletal: Normal range of motion.  Neurological: He is alert and oriented to person, place, and time.  Skin: Skin is warm and dry. No rash noted.  Psychiatric: He has a normal mood and affect.     ED Treatments / Results  Labs (all labs ordered are listed, but only abnormal results are displayed) Labs Reviewed - No data to display  EKG  EKG Interpretation None       Radiology No results found.  Procedures Procedures (including critical care time)  Medications Ordered in ED Medications  ibuprofen (ADVIL,MOTRIN) tablet 600 mg (not administered)     Initial Impression / Assessment and Plan / ED Course  I have reviewed the triage vital signs and the nursing notes.  Pertinent labs & imaging results that were available during my care of the patient were reviewed by me and considered in my medical decision making (see chart for details).     Patient presents with flu-like symptoms of myalgias, congestion, rigors and chills. He is tachycardic here. He reports less PO intake including fluids, less urination. Likely dehydration. Patient is offered IV fluids but declines stating he just wants to go home and go to bed. Return precautions discussed. Ibuprofen provided.   Final Clinical Impressions(s) / ED Diagnoses   Final diagnoses:  Flu-like symptoms    ED  Discharge Orders        Ordered    ibuprofen (ADVIL,MOTRIN) 600 MG tablet  Every 6 hours PRN     12/26/16 2246       Elpidio AnisUpstill, Taimur Fier, PA-C 12/26/16 2310    Charlynne PanderYao, David Hsienta, MD 12/27/16 1115

## 2017-01-02 ENCOUNTER — Emergency Department (HOSPITAL_COMMUNITY)

## 2017-01-02 ENCOUNTER — Inpatient Hospital Stay (HOSPITAL_COMMUNITY)
Admission: EM | Admit: 2017-01-02 | Discharge: 2017-01-06 | DRG: 536 | Disposition: A | Discharge status: Home / Self Care | Attending: General Surgery | Admitting: General Surgery

## 2017-01-02 ENCOUNTER — Inpatient Hospital Stay (HOSPITAL_COMMUNITY)

## 2017-01-02 ENCOUNTER — Encounter (HOSPITAL_COMMUNITY): Payer: Self-pay | Admitting: Emergency Medicine

## 2017-01-02 ENCOUNTER — Other Ambulatory Visit: Payer: Self-pay

## 2017-01-02 DIAGNOSIS — S32591A Other specified fracture of right pubis, initial encounter for closed fracture: Principal | ICD-10-CM | POA: Diagnosis present

## 2017-01-02 DIAGNOSIS — Z880 Allergy status to penicillin: Secondary | ICD-10-CM

## 2017-01-02 DIAGNOSIS — F17210 Nicotine dependence, cigarettes, uncomplicated: Secondary | ICD-10-CM | POA: Diagnosis present

## 2017-01-02 DIAGNOSIS — Z79899 Other long term (current) drug therapy: Secondary | ICD-10-CM

## 2017-01-02 DIAGNOSIS — F200 Paranoid schizophrenia: Secondary | ICD-10-CM | POA: Diagnosis present

## 2017-01-02 DIAGNOSIS — F4310 Post-traumatic stress disorder, unspecified: Secondary | ICD-10-CM | POA: Diagnosis present

## 2017-01-02 DIAGNOSIS — Z888 Allergy status to other drugs, medicaments and biological substances status: Secondary | ICD-10-CM

## 2017-01-02 DIAGNOSIS — S329XXA Fracture of unspecified parts of lumbosacral spine and pelvis, initial encounter for closed fracture: Secondary | ICD-10-CM | POA: Diagnosis present

## 2017-01-02 DIAGNOSIS — T148XXA Other injury of unspecified body region, initial encounter: Secondary | ICD-10-CM

## 2017-01-02 DIAGNOSIS — Y92410 Unspecified street and highway as the place of occurrence of the external cause: Secondary | ICD-10-CM

## 2017-01-02 LAB — URINALYSIS, ROUTINE W REFLEX MICROSCOPIC
Bacteria, UA: NONE SEEN
Bilirubin Urine: NEGATIVE
Glucose, UA: NEGATIVE mg/dL
KETONES UR: 5 mg/dL — AB
Leukocytes, UA: NEGATIVE
NITRITE: NEGATIVE
PH: 5 (ref 5.0–8.0)
Protein, ur: 100 mg/dL — AB

## 2017-01-02 LAB — COMPREHENSIVE METABOLIC PANEL
ALBUMIN: 4.1 g/dL (ref 3.5–5.0)
ALT: 31 U/L (ref 17–63)
ANION GAP: 12 (ref 5–15)
AST: 73 U/L — ABNORMAL HIGH (ref 15–41)
Alkaline Phosphatase: 68 U/L (ref 38–126)
BILIRUBIN TOTAL: 1.1 mg/dL (ref 0.3–1.2)
BUN: 19 mg/dL (ref 6–20)
CO2: 22 mmol/L (ref 22–32)
Calcium: 9.1 mg/dL (ref 8.9–10.3)
Chloride: 100 mmol/L — ABNORMAL LOW (ref 101–111)
Creatinine, Ser: 1.1 mg/dL (ref 0.61–1.24)
Glucose, Bld: 91 mg/dL (ref 65–99)
POTASSIUM: 4.3 mmol/L (ref 3.5–5.1)
Sodium: 134 mmol/L — ABNORMAL LOW (ref 135–145)
TOTAL PROTEIN: 7.6 g/dL (ref 6.5–8.1)

## 2017-01-02 LAB — CBC
HEMATOCRIT: 42.8 % (ref 39.0–52.0)
HEMOGLOBIN: 15 g/dL (ref 13.0–17.0)
MCH: 30.4 pg (ref 26.0–34.0)
MCHC: 35 g/dL (ref 30.0–36.0)
MCV: 86.8 fL (ref 78.0–100.0)
Platelets: 230 10*3/uL (ref 150–400)
RBC: 4.93 MIL/uL (ref 4.22–5.81)
RDW: 14.4 % (ref 11.5–15.5)
WBC: 17.2 10*3/uL — AB (ref 4.0–10.5)

## 2017-01-02 LAB — I-STAT TROPONIN, ED
TROPONIN I, POC: 0.04 ng/mL (ref 0.00–0.08)
TROPONIN I, POC: 0.03 ng/mL (ref 0.00–0.08)

## 2017-01-02 LAB — RAPID URINE DRUG SCREEN, HOSP PERFORMED
AMPHETAMINES: NOT DETECTED
BENZODIAZEPINES: NOT DETECTED
Barbiturates: NOT DETECTED
COCAINE: POSITIVE — AB
OPIATES: NOT DETECTED
TETRAHYDROCANNABINOL: POSITIVE — AB

## 2017-01-02 LAB — ETHANOL: Alcohol, Ethyl (B): <10 mg/dL (ref <10)

## 2017-01-02 LAB — CK: CK TOTAL: 2066 U/L — AB (ref 49–397)

## 2017-01-02 LAB — CBG MONITORING, ED: GLUCOSE-CAPILLARY: 96 mg/dL (ref 65–99)

## 2017-01-02 MED ORDER — FENTANYL CITRATE (PF) 100 MCG/2ML IJ SOLN
50.0000 ug | Freq: Once | INTRAMUSCULAR | Status: AC
  Administered 2017-01-02: 50 ug via INTRAVENOUS
  Filled 2017-01-02: qty 2

## 2017-01-02 MED ORDER — POTASSIUM CHLORIDE IN NACL 20-0.9 MEQ/L-% IV SOLN
INTRAVENOUS | Status: DC
  Administered 2017-01-03: 01:00:00 via INTRAVENOUS
  Filled 2017-01-02: qty 1000

## 2017-01-02 MED ORDER — OXYCODONE HCL 5 MG PO TABS
10.0000 mg | ORAL_TABLET | ORAL | Status: DC | PRN
Start: 2017-01-02 — End: 2017-01-06
  Administered 2017-01-02 – 2017-01-06 (×13): 10 mg via ORAL
  Filled 2017-01-02 (×13): qty 2

## 2017-01-02 MED ORDER — HYDROMORPHONE HCL 1 MG/ML IJ SOLN
1.0000 mg | INTRAMUSCULAR | Status: DC | PRN
  Administered 2017-01-03 – 2017-01-06 (×13): 1 mg via INTRAVENOUS
  Filled 2017-01-02 (×13): qty 1

## 2017-01-02 MED ORDER — DIVALPROEX SODIUM 500 MG PO DR TAB
500.0000 mg | DELAYED_RELEASE_TABLET | Freq: Every day | ORAL | Status: DC
  Administered 2017-01-02 – 2017-01-05 (×4): 500 mg via ORAL
  Filled 2017-01-02 (×4): qty 1

## 2017-01-02 MED ORDER — OXYCODONE HCL 5 MG PO TABS
5.0000 mg | ORAL_TABLET | ORAL | Status: DC | PRN
  Administered 2017-01-06: 5 mg via ORAL
  Filled 2017-01-02: qty 1

## 2017-01-02 MED ORDER — DIVALPROEX SODIUM 500 MG PO DR TAB: 500.0000 mg | DELAYED_RELEASE_TABLET | Freq: Two times a day (BID) | ORAL | Status: DC

## 2017-01-02 MED ORDER — IOPAMIDOL (ISOVUE-300) INJECTION 61%
INTRAVENOUS | Status: AC
  Filled 2017-01-02: qty 75

## 2017-01-02 MED ORDER — SODIUM CHLORIDE 0.9 % IV BOLUS (SEPSIS)
1000.0000 mL | Freq: Once | INTRAVENOUS | Status: AC
  Administered 2017-01-02: 1000 mL via INTRAVENOUS

## 2017-01-02 MED ORDER — ENOXAPARIN SODIUM 40 MG/0.4ML ~~LOC~~ SOLN
40.0000 mg | SUBCUTANEOUS | Status: DC
  Administered 2017-01-03 – 2017-01-04 (×2): 40 mg via SUBCUTANEOUS
  Filled 2017-01-02 (×2): qty 0.4

## 2017-01-02 MED ORDER — GABAPENTIN 300 MG PO CAPS
900.0000 mg | ORAL_CAPSULE | Freq: Three times a day (TID) | ORAL | Status: DC
  Administered 2017-01-02 – 2017-01-06 (×11): 900 mg via ORAL
  Filled 2017-01-02 (×11): qty 3

## 2017-01-02 MED ORDER — DIVALPROEX SODIUM 500 MG PO DR TAB
1000.0000 mg | DELAYED_RELEASE_TABLET | Freq: Every day | ORAL | Status: DC
  Administered 2017-01-03 – 2017-01-04 (×2): 1000 mg via ORAL
  Filled 2017-01-02 (×2): qty 2

## 2017-01-02 MED ORDER — IOPAMIDOL (ISOVUE-300) INJECTION 61%
INTRAVENOUS | Status: AC
  Administered 2017-01-02: 100 mL
  Filled 2017-01-02: qty 100

## 2017-01-02 MED ORDER — HYDROMORPHONE HCL 1 MG/ML IJ SOLN
1.0000 mg | Freq: Once | INTRAMUSCULAR | Status: AC
  Administered 2017-01-02: 1 mg via INTRAVENOUS
  Filled 2017-01-02: qty 1

## 2017-01-02 MED ORDER — ONDANSETRON 4 MG PO TBDP
4.0000 mg | ORAL_TABLET | Freq: Four times a day (QID) | ORAL | Status: DC | PRN
  Filled 2017-01-02: qty 1

## 2017-01-02 MED ORDER — HYDROMORPHONE HCL 1 MG/ML IJ SOLN
1.0000 mg | INTRAMUSCULAR | Status: DC | PRN
  Administered 2017-01-02: 1 mg via INTRAVENOUS
  Filled 2017-01-02: qty 1

## 2017-01-02 MED ORDER — ONDANSETRON HCL 4 MG/2ML IJ SOLN: 4.0000 mg | Freq: Four times a day (QID) | INTRAMUSCULAR | Status: DC | PRN

## 2017-01-02 MED ORDER — LORAZEPAM 2 MG/ML IJ SOLN
1.0000 mg | Freq: Once | INTRAMUSCULAR | Status: AC
  Administered 2017-01-02: 1 mg via INTRAVENOUS
  Filled 2017-01-02: qty 1
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
## 2017-01-02 NOTE — ED Notes (Signed)
Patient transported to CT and then x-ray 
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
## 2017-01-02 NOTE — ED Notes (Signed)
Pt back from CT

## 2017-01-03 LAB — HIV ANTIBODY (ROUTINE TESTING W REFLEX): HIV Screen 4th Generation wRfx: NONREACTIVE

## 2017-01-03 MED ORDER — ACYCLOVIR 400 MG PO TABS
400.0000 mg | ORAL_TABLET | Freq: Two times a day (BID) | ORAL | Status: DC
  Administered 2017-01-03 – 2017-01-06 (×7): 400 mg via ORAL
  Filled 2017-01-03 (×9): qty 1

## 2017-01-03 MED ORDER — INFLUENZA VAC SPLIT QUAD 0.5 ML IM SUSY
0.5000 mL | PREFILLED_SYRINGE | INTRAMUSCULAR | Status: DC
  Filled 2017-01-03: qty 0.5
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
## 2017-01-03 NOTE — Progress Notes (Signed)
  Progress Note: General Surgery Service   Assessment/Plan: Patient Active Problem List   Diagnosis Date Noted  . Pelvic fracture (HCC) 01/02/2017  . Noncompliance with medication regimen   . Herpes simplex 02/11/2016  . Dehydration 12/01/2015  . Leukocytosis 12/01/2015  . Schizophrenia (HCC) 12/01/2015  . 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 Carpal tunnel syndrome, bilateral 09/28/2015  . Neck pain 09/28/2015  . Low back pain 09/28/2015  . Plantar fasciitis, bilateral 09/28/2015  . Dental decay 09/28/2015   nonoperative management of pelvic fractures -will attempt to ambulate with PT -continue pain control   LOS: 1 day  Chief Complaint/Subjective: Pain over lower abdomen, pain with leg movements  Objective: Vital signs in last 24 hours: Temp:  [98.4 F (36.9 C)-98.7 F (37.1 C)] 98.4 F (36.9 C) (12/26 0643) Pulse Rate:  [67-100] 74 (12/26 0643) Resp:  [13-26] 16 (12/26 0643) BP: (102-142)/(68-120) 102/68 (12/26 0643) SpO2:  [93 %-99 %] 97 % (12/26 0643) Weight:  [72.6 kg (160 lb)] 72.6 kg (160 lb) (12/25 1002) Last BM Date: 01/01/17  Intake/Output from previous day: 12/25 0701 - 12/26 0700 In: 2280 [P.O.:2280] Out: 1000 [Urine:1000] Intake/Output this shift: No intake/output data recorded.  Lungs: CTAB  Cardiovascular: RRR  Abd: soft, TTP lower abdomen  Extremities: able to move toes, further movement limited by pain  Neuro: AOx4  Lab Results: CBC  Recent Labs    01/02/17 1045  WBC 17.2*  HGB 15.0  HCT 42.8  PLT 230   BMET Recent Labs    01/02/17 1045  NA 134*  K 4.3  CL 100*  CO2 22  GLUCOSE 91  BUN 19  CREATININE 1.10  CALCIUM 9.1   PT/INR No results for input(s): LABPROT, INR in the last 72 hours. ABG No results for input(s): PHART, HCO3 in the last 72 hours.  Invalid input(s): PCO2, PO2  Studies/Results:  Anti-infectives: Anti-infectives (From admission, onward)   None      Medications: Scheduled  Meds: . divalproex  1,000 mg Oral Daily   And  . divalproex  500 mg Oral QHS  . enoxaparin (LOVENOX) injection  40 mg Subcutaneous Q24H  . gabapentin  900 mg Oral TID  . [START ON 01/04/2017] Influenza vac split quadrivalent PF  0.5 mL Intramuscular Tomorrow-1000   Continuous Infusions: . 0.9 % NaCl with KCl 20 mEq / L 10 mL/hr at 01/03/17 0747   PRN Meds:.HYDROmorphone (DILAUDID) injection, ondansetron **OR** ondansetron (ZOFRAN) IV, oxyCODONE, oxyCODONE  Rodman PickleLuke Aaron Kinsinger, MD Pg# [phone] [facility]

## 2017-01-04 ENCOUNTER — Inpatient Hospital Stay (HOSPITAL_COMMUNITY)

## 2017-01-04 MED ORDER — TRAZODONE HCL 50 MG PO TABS: 150.0000 mg | ORAL_TABLET | Freq: Every evening | ORAL | Status: DC | PRN

## 2017-01-04 MED ORDER — VENLAFAXINE HCL 75 MG PO TABS
75.0000 mg | ORAL_TABLET | Freq: Every day | ORAL | Status: DC
  Administered 2017-01-04 – 2017-01-06 (×3): 75 mg via ORAL
  Filled 2017-01-04 (×3): qty 1

## 2017-01-04 MED ORDER — BACLOFEN 10 MG PO TABS
5.0000 mg | ORAL_TABLET | Freq: Two times a day (BID) | ORAL | Status: DC | PRN
  Administered 2017-01-04: 5 mg via ORAL
  Filled 2017-01-04: qty 1

## 2017-01-04 MED ORDER — ACETAMINOPHEN 500 MG PO TABS
1000.0000 mg | ORAL_TABLET | Freq: Three times a day (TID) | ORAL | Status: DC
  Administered 2017-01-04 – 2017-01-06 (×6): 1000 mg via ORAL
  Filled 2017-01-04 (×6): qty 2

## 2017-01-04 MED ORDER — LURASIDONE HCL 40 MG PO TABS
80.0000 mg | ORAL_TABLET | Freq: Every day | ORAL | Status: DC
  Administered 2017-01-04 – 2017-01-05 (×2): 80 mg via ORAL
  Filled 2017-01-04 (×2): qty 2

## 2017-01-04 MED ORDER — DOCUSATE SODIUM 100 MG PO CAPS
100.0000 mg | ORAL_CAPSULE | Freq: Two times a day (BID) | ORAL | Status: DC
  Administered 2017-01-04 – 2017-01-06 (×5): 100 mg via ORAL
  Filled 2017-01-04 (×5): qty 1

## 2017-01-04 MED ORDER — HYDROXYZINE HCL 25 MG PO TABS: 25.0000 mg | ORAL_TABLET | Freq: Three times a day (TID) | ORAL | Status: DC | PRN
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
## 2017-01-04 NOTE — Plan of Care (Signed)
  Progressing Education: Knowledge of General Education information will improve 01/04/2017 1102 - Progressing by Darreld Mcleanox, Danesha Kirchoff, RN Health Behavior/Discharge Planning: Ability to manage health-related needs will improve 01/04/2017 1102 - Progressing by Darreld Mcleanox, Lanis Storlie, RN Clinical Measurements: Ability to maintain clinical measurements within normal limits will improve 01/04/2017 1102 - Progressing by Darreld Mcleanox, Derry Kassel, RN Will remain free from infection 01/04/2017 1102 - Progressing by Darreld Mcleanox, Thanos Cousineau, RN Diagnostic test results will improve 01/04/2017 1102 - Progressing by Darreld Mcleanox, Tanica Gaige, RN Respiratory complications will improve 01/04/2017 1102 - Progressing by Darreld Mcleanox, Devi Hopman, RN Cardiovascular complication will be avoided 01/04/2017 1102 - Progressing by Darreld Mcleanox, Kessler Solly, RN Activity: Risk for activity intolerance will decrease 01/04/2017 1102 - Progressing by Darreld Mcleanox, Vito Beg, RN Nutrition: Adequate nutrition will be maintained 01/04/2017 1102 - Progressing by Darreld Mcleanox, Mohamedamin Nifong, RN Coping: Level of anxiety will decrease 01/04/2017 1102 - Progressing by Darreld Mcleanox, Patrik Turnbaugh, RN Elimination: Will not experience complications related to bowel motility 01/04/2017 1102 - Progressing by Darreld Mcleanox, Jahnaya Branscome, RN Will not experience complications related to urinary retention 01/04/2017 1102 - Progressing by Darreld Mcleanox, Shaunie Boehm Debo, RN Pain Managment: General experience of comfort will improve 01/04/2017 1102 - Progressing by Darreld Mcleanox, Venda Dice, RN Skin Integrity: Risk for impaired skin integrity will decrease 01/04/2017 1102 - Progressing by Darreld Mcleanox, , RN
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
## 2017-01-05 MED ORDER — OXYCODONE HCL 10 MG PO TABS: 5.0000 mg | ORAL_TABLET | ORAL | 0 refills | Status: DC | PRN

## 2017-01-05 NOTE — Discharge Instructions (Signed)
1. PAIN CONTROL:  1. Pain is best controlled by a usual combination of three different methods TOGETHER:  1. Ice/Heat 2. Over the counter pain medication 3. Prescription pain medication 2. Most patients will experience some swelling and bruising around wounds. Ice packs or heating pads (30-60 minutes up to 6 times a day) will help. Use ice for the first few days to help decrease swelling and bruising, then switch to heat to help relax tight/sore spots and speed recovery. Some people prefer to use ice alone, heat alone, alternating between ice & heat. Experiment to what works for you. Swelling and bruising can take several weeks to resolve.  3. It is helpful to take an over-the-counter pain medication regularly for the first few weeks. Choose one of the following that works best for you:  1. Naproxen (Aleve, etc) Two 220mg  tabs twice a day 2. Ibuprofen (Advil, etc) Three 200mg  tabs four times a day (every meal & bedtime) 3. Acetaminophen (Tylenol, etc) 500-650mg  four times a day (every meal & bedtime) 4. A prescription for pain medication (such as oxycodone, hydrocodone, etc) should be given to you upon discharge. Take your pain medication as prescribed.  1. If you are having problems/concerns with the prescription medicine (does not control pain, nausea, vomiting, rash, itching, etc), please call us [phone] to see if we need to switch you to a different pain medicine that will work better for you and/or control your side effect better. 2. If you need a refill on your pain medication, please contact your pharmacy. They will contact our office to request authorization. Prescriptions will not be filled after 5 pm or on week-ends. 4. Avoid getting constipated. When taking pain medications, it is common to experience some constipation. Increasing fluid intake and taking a fiber supplement (such as Metamucil, Citrucel, FiberCon, MiraLax, etc) 1-2 times a day regularly will usually help prevent this  problem from occurring. A mild laxative (prune juice, Milk of Magnesia, MiraLax, etc) should be taken according to package directions if there are no bowel movements after 48 hours.  5. Watch out for diarrhea. If you have many loose bowel movements, simplify your diet to bland foods & liquids for a few days. Stop any stool softeners and decrease your fiber supplement. Switching to mild anti-diarrheal medications (Kayopectate, Pepto Bismol) can help. If this worsens or does not improve, please call us.    WHEN TO CALL US [phone]:  1. Poor pain control 2. Reactions / problems with new medications (rash/itching, nausea, etc)  3. Fever over 101.5 F (38.5 C) 4. Worsening swelling or bruising 5. Continued bleeding from wounds. 6. Increased pain, redness, or drainage from the wounds which could be signs of infection  The clinic staff is available to answer your questions during regular business hours (8:30am-5pm). Please dont hesitate to call and ask to speak to one of our nurses for clinical concerns.  If you have a medical emergency, go to the nearest emergency room or call 911.  A surgeon from [facility] is always on call at the [facility]   [facility] [place]  [address], [address], [place], [place] [address] ?  MAIN: [phone] ? TOLL FREE: [phone] ?  FAX [phone](336) [phone]  [email or web address]

## 2017-01-05 NOTE — Progress Notes (Signed)
PT Cancellation Note  Patient Details Name: Danny RileyDarcell Lester MRN: [id number] DOB: 04/24/1975   Cancelled Treatment:    Reason Eval/Treat Not Completed: Patient declined, no reason specified Attempted to treat patient this afternoon, patient declines all further therapy this afternoon stating "I'm supposed to be leaving, I don't have any time for therapy".    Nedra HaiKristen Unger PT, DPT, CBIS  Supplemental Physical Therapist [facility]

## 2017-01-05 NOTE — Progress Notes (Signed)
PT Cancellation Note  Patient Details Name: Danny RileyDarcell Holliman MRN: [id number] DOB: 1975/11/12   Cancelled Treatment:    Reason Eval/Treat Not Completed: Patient declined, no reason specified patient received being fairly upset about not being able to get in contact with the friend he had been staying with, he reports he is concerned about not having anywhere to go when he is discharged from the hospital. Patient declines PT this morning. Case manager notified regarding patient's situation and concerns about having a place to be discharged to. PT to attempt to return later in day if time allows.    Nedra HaiKristen Ashlin Kreps PT, DPT, CBIS  Supplemental Physical Therapist [facility]
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
## 2017-01-05 NOTE — Progress Notes (Signed)
OT Cancellation Note  Patient Details Name: Clovis RileyDarcell Steil MRN: [id number] DOB: 02/03/1975   Cancelled Treatment:    Reason Eval/Treat Not Completed: Patient declined, no reason specified             Pt states"  I am leaving and I dont need therapy"  Harolyn RutherfordJones, Saray Capasso B 01/05/2017, 2:53 PM

## 2017-01-05 NOTE — Plan of Care (Signed)
  Progressing Education: Knowledge of General Education information will improve 01/05/2017 1030 - Progressing by Darreld Mcleanox, Kindra Bickham, RN Health Behavior/Discharge Planning: Ability to manage health-related needs will improve 01/05/2017 1030 - Progressing by Darreld Mcleanox, Canda Podgorski, RN Clinical Measurements: Ability to maintain clinical measurements within normal limits will improve 01/05/2017 1030 - Progressing by Darreld Mcleanox, Reon Hunley, RN Will remain free from infection 01/05/2017 1030 - Progressing by Darreld Mcleanox, Aprile Dickenson, RN Diagnostic test results will improve 01/05/2017 1030 - Progressing by Darreld Mcleanox, Denaja Verhoeven, RN Respiratory complications will improve 01/05/2017 1030 - Progressing by Darreld Mcleanox, Mal Asher, RN Cardiovascular complication will be avoided 01/05/2017 1030 - Progressing by Darreld Mcleanox, Rylynn Kobs, RN Activity: Risk for activity intolerance will decrease 01/05/2017 1030 - Progressing by Darreld Mcleanox, Anneke Cundy, RN Nutrition: Adequate nutrition will be maintained 01/05/2017 1030 - Progressing by Darreld Mcleanox, Zaden Sako, RN Coping: Level of anxiety will decrease 01/05/2017 1030 - Progressing by Darreld Mcleanox, Dorrie Cocuzza, RN Elimination: Will not experience complications related to bowel motility 01/05/2017 1030 - Progressing by Darreld Mcleanox, Onyx Schirmer, RN Will not experience complications related to urinary retention 01/05/2017 1030 - Progressing by Darreld Mcleanox, Briana Farner, RN Pain Managment: General experience of comfort will improve 01/05/2017 1030 - Progressing by Darreld Mcleanox, Tamelia Michalowski, RN Skin Integrity: Risk for impaired skin integrity will decrease 01/05/2017 1030 - Progressing by Darreld Mcleanox, Jet Traynham, RN
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
## 2017-01-05 NOTE — Progress Notes (Signed)
Noted pt not discharging today.  Pt accepted for charity home health and D[facility] through [facility].  Will receive RW and HHPT. Please verify discharging address with pt, as currently he states he is going to address on file [facility]([address] ).  Should this change, will need to notify case manager.     Thank you,  Quintella BatonJulie W. Calbert Hulsebus, RN, BSN  Trauma/Neuro ICU Case Manager [phone]

## 2017-01-05 NOTE — Progress Notes (Signed)
Pelvic x-rays reviewed and showed no displacement. Recommend continued WBAT BLE and return to see me in 4 weeks.  Roby LoftsKevin P. Haddix, MD [facility] [phone] (phone)

## 2017-01-06 NOTE — Progress Notes (Signed)
D/c to home via cab. Rx and d/c instructions given and explained with understanding. VSS. Pain improved 4/10.breathing regular and unlabored on room air and Walker sent home with patient

## 2017-01-10 ENCOUNTER — Ambulatory Visit (INDEPENDENT_AMBULATORY_CARE_PROVIDER_SITE_OTHER): Payer: Medicaid Other | Admitting: Internal Medicine

## 2017-01-10 ENCOUNTER — Encounter: Payer: Self-pay | Admitting: Internal Medicine

## 2017-01-10 ENCOUNTER — Telehealth: Payer: Self-pay | Admitting: Internal Medicine

## 2017-01-10 VITALS — BP 142/92 | HR 78 | Resp 12 | Ht 67.0 in | Wt 163.0 lb

## 2017-01-10 DIAGNOSIS — R300 Dysuria: Secondary | ICD-10-CM

## 2017-01-10 DIAGNOSIS — R52 Pain, unspecified: Secondary | ICD-10-CM

## 2017-01-10 DIAGNOSIS — S32591D Other specified fracture of right pubis, subsequent encounter for fracture with routine healing: Secondary | ICD-10-CM

## 2017-01-10 DIAGNOSIS — M79642 Pain in left hand: Secondary | ICD-10-CM

## 2017-01-10 LAB — POCT URINALYSIS DIPSTICK
BILIRUBIN UA: NEGATIVE
Glucose, UA: NEGATIVE
KETONES UA: NEGATIVE
Leukocytes, UA: NEGATIVE
NITRITE UA: NEGATIVE
PH UA: 5 (ref 5.0–8.0)
PROTEIN UA: NEGATIVE
RBC UA: NEGATIVE
Spec Grav, UA: 1.01 (ref 1.010–1.025)
UROBILINOGEN UA: 0.2 U/dL

## 2017-01-10 MED ORDER — OXYCODONE-ACETAMINOPHEN 5-325 MG PO TABS: ORAL_TABLET | ORAL | 0 refills | Status: DC
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
## 2017-01-11 ENCOUNTER — Emergency Department (HOSPITAL_COMMUNITY)
Admission: EM | Admit: 2017-01-11 | Discharge: 2017-01-11 | Disposition: A | Discharge status: Home / Self Care | Payer: Medicaid Other | Attending: Emergency Medicine | Admitting: Emergency Medicine

## 2017-01-11 ENCOUNTER — Other Ambulatory Visit: Payer: Self-pay

## 2017-01-11 ENCOUNTER — Emergency Department (HOSPITAL_COMMUNITY): Payer: Medicaid Other

## 2017-01-11 DIAGNOSIS — R079 Chest pain, unspecified: Secondary | ICD-10-CM | POA: Diagnosis present

## 2017-01-11 DIAGNOSIS — R042 Hemoptysis: Secondary | ICD-10-CM | POA: Diagnosis not present

## 2017-01-11 DIAGNOSIS — Z5321 Procedure and treatment not carried out due to patient leaving prior to being seen by health care provider: Secondary | ICD-10-CM | POA: Insufficient documentation

## 2017-01-11 LAB — BASIC METABOLIC PANEL
ANION GAP: 10 (ref 5–15)
BUN: 7 mg/dL (ref 6–20)
CALCIUM: 8.9 mg/dL (ref 8.9–10.3)
CO2: 24 mmol/L (ref 22–32)
CREATININE: 0.85 mg/dL (ref 0.61–1.24)
Chloride: 103 mmol/L (ref 101–111)
Glucose, Bld: 131 mg/dL — ABNORMAL HIGH (ref 65–99)
Potassium: 3.5 mmol/L (ref 3.5–5.1)
SODIUM: 137 mmol/L (ref 135–145)

## 2017-01-11 LAB — URINALYSIS, ROUTINE W REFLEX MICROSCOPIC
Bilirubin Urine: NEGATIVE
Glucose, UA: NEGATIVE mg/dL
Hgb urine dipstick: NEGATIVE
KETONES UR: NEGATIVE mg/dL
LEUKOCYTES UA: NEGATIVE
NITRITE: NEGATIVE
PROTEIN: NEGATIVE mg/dL
Specific Gravity, Urine: 1.014 (ref 1.005–1.030)
pH: 6 (ref 5.0–8.0)

## 2017-01-11 LAB — CBC
HCT: 32.1 % — ABNORMAL LOW (ref 39.0–52.0)
HEMOGLOBIN: 10.5 g/dL — AB (ref 13.0–17.0)
MCH: 29.2 pg (ref 26.0–34.0)
MCHC: 32.7 g/dL (ref 30.0–36.0)
MCV: 89.2 fL (ref 78.0–100.0)
PLATELETS: 499 10*3/uL — AB (ref 150–400)
RBC: 3.6 MIL/uL — AB (ref 4.22–5.81)
RDW: 14.7 % (ref 11.5–15.5)
WBC: 10.6 10*3/uL — ABNORMAL HIGH (ref 4.0–10.5)

## 2017-01-11 LAB — I-STAT TROPONIN, ED: TROPONIN I, POC: 0 ng/mL (ref 0.00–0.08)
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
## 2017-01-11 NOTE — ED Notes (Signed)
Pt and significant other to the desk complaining about the wait time. Pt verbally aggressive calling staff names. Pt states he is going to leave and call an ambulance so he can be seen faster. Security paged.

## 2017-01-11 NOTE — ED Triage Notes (Signed)
Pt also c/o burning during urination.

## 2017-01-11 NOTE — Telephone Encounter (Signed)
Spoke with pharmacy patient picked up Rx on 01/05/17. Spoke with dr. Eliberto IvoryBlackman's office and he stated patient needs to follow up with ortho that he referred him through. Patient came in for follow up appointment yesterday.

## 2017-01-12 ENCOUNTER — Encounter (HOSPITAL_COMMUNITY): Payer: Self-pay | Admitting: Emergency Medicine

## 2017-01-12 ENCOUNTER — Emergency Department (HOSPITAL_COMMUNITY): Payer: No Typology Code available for payment source

## 2017-01-12 ENCOUNTER — Emergency Department (HOSPITAL_COMMUNITY)
Admission: EM | Admit: 2017-01-12 | Discharge: 2017-01-12 | Disposition: A | Discharge status: Home / Self Care | Payer: No Typology Code available for payment source | Attending: Emergency Medicine | Admitting: Emergency Medicine

## 2017-01-12 DIAGNOSIS — F17210 Nicotine dependence, cigarettes, uncomplicated: Secondary | ICD-10-CM | POA: Diagnosis not present

## 2017-01-12 DIAGNOSIS — Z79899 Other long term (current) drug therapy: Secondary | ICD-10-CM | POA: Diagnosis not present

## 2017-01-12 DIAGNOSIS — Y9389 Activity, other specified: Secondary | ICD-10-CM | POA: Insufficient documentation

## 2017-01-12 DIAGNOSIS — Y92410 Unspecified street and highway as the place of occurrence of the external cause: Secondary | ICD-10-CM | POA: Diagnosis not present

## 2017-01-12 DIAGNOSIS — S329XXD Fracture of unspecified parts of lumbosacral spine and pelvis, subsequent encounter for fracture with routine healing: Secondary | ICD-10-CM | POA: Diagnosis not present

## 2017-01-12 DIAGNOSIS — S62367A Nondisplaced fracture of neck of fifth metacarpal bone, left hand, initial encounter for closed fracture: Secondary | ICD-10-CM | POA: Insufficient documentation

## 2017-01-12 DIAGNOSIS — Y999 Unspecified external cause status: Secondary | ICD-10-CM | POA: Insufficient documentation

## 2017-01-12 DIAGNOSIS — R52 Pain, unspecified: Secondary | ICD-10-CM

## 2017-01-12 DIAGNOSIS — J9811 Atelectasis: Secondary | ICD-10-CM

## 2017-01-12 NOTE — ED Notes (Signed)
Bed: WA27 Expected date:  Expected time:  Means of arrival:  Comments: No bed
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
## 2017-01-12 NOTE — Discharge Instructions (Signed)
Ice and elevate your hand.  Continue pain medications that were given to you by your doctor.  Please follow-up with orthopedics as referred.  Return if any problems.

## 2017-01-15 ENCOUNTER — Telehealth: Payer: Self-pay | Admitting: Internal Medicine

## 2017-01-15 DIAGNOSIS — R52 Pain, unspecified: Secondary | ICD-10-CM

## 2017-01-15 DIAGNOSIS — M79642 Pain in left hand: Secondary | ICD-10-CM

## 2017-01-15 DIAGNOSIS — S32591D Other specified fracture of right pubis, subsequent encounter for fracture with routine healing: Secondary | ICD-10-CM

## 2017-01-15 MED ORDER — OXYCODONE-ACETAMINOPHEN 5-325 MG PO TABS: ORAL_TABLET | ORAL | 0 refills | Status: DC

## 2017-01-15 NOTE — Telephone Encounter (Signed)
Spoke with patient informed we are waiting for Dr. Jena GaussHaddix office to call back with recommendation on how to treat pain until seeing him. Patient states Dr. Jena GaussHaddix office moved his appointment up to 01/23/17.

## 2017-01-15 NOTE — Telephone Encounter (Signed)
See next note

## 2017-01-15 NOTE — Telephone Encounter (Signed)
Patient needs refill of pain medications.  Was told by physical therapist to call back to primary care physician to receive pain medication.  Please advise to get pain medication.  Patient can be reached at [phone].

## 2017-01-15 NOTE — Telephone Encounter (Signed)
Spoke with patient. Informed Rx was sent to pharmacy and Dr. Jena GaussHaddix will be taking over pain medication at this Rx and he should be feeling better in a month. Patient also aware of why he is urinating so frequent and that it should get better over time. Patient verbalized understanding

## 2017-01-15 NOTE — Telephone Encounter (Signed)
Spoke with Fleet Contrasachel at Dr. Jena GaussHaddix office. States she will speak with Dr. Jena GaussHaddix in regards to pain control for patint and she will call the office back

## 2017-01-25 ENCOUNTER — Emergency Department (HOSPITAL_COMMUNITY)
Admission: EM | Admit: 2017-01-25 | Discharge: 2017-01-26 | Disposition: A | Discharge status: Home / Self Care | Payer: Medicaid Other | Attending: Emergency Medicine | Admitting: Emergency Medicine

## 2017-01-25 ENCOUNTER — Encounter (HOSPITAL_COMMUNITY): Payer: Self-pay | Admitting: Emergency Medicine

## 2017-01-25 ENCOUNTER — Ambulatory Visit (HOSPITAL_COMMUNITY)
Admission: RE | Admit: 2017-01-25 | Discharge: 2017-01-25 | Disposition: A | Discharge status: Home / Self Care | Payer: Medicaid Other | Attending: Psychiatry | Admitting: Psychiatry

## 2017-01-25 DIAGNOSIS — R4587 Impulsiveness: Secondary | ICD-10-CM | POA: Diagnosis not present

## 2017-01-25 DIAGNOSIS — Z630 Problems in relationship with spouse or partner: Secondary | ICD-10-CM | POA: Diagnosis not present

## 2017-01-25 DIAGNOSIS — R45851 Suicidal ideations: Secondary | ICD-10-CM | POA: Diagnosis present

## 2017-01-25 DIAGNOSIS — Z79899 Other long term (current) drug therapy: Secondary | ICD-10-CM | POA: Diagnosis not present

## 2017-01-25 DIAGNOSIS — F17210 Nicotine dependence, cigarettes, uncomplicated: Secondary | ICD-10-CM | POA: Diagnosis not present

## 2017-01-25 DIAGNOSIS — F209 Schizophrenia, unspecified: Secondary | ICD-10-CM | POA: Diagnosis not present

## 2017-01-25 DIAGNOSIS — F329 Major depressive disorder, single episode, unspecified: Secondary | ICD-10-CM | POA: Diagnosis not present

## 2017-01-25 DIAGNOSIS — F22 Delusional disorders: Secondary | ICD-10-CM | POA: Diagnosis not present

## 2017-01-25 HISTORY — DX: Unspecified fracture of shaft of humerus, unspecified arm, initial encounter for closed fracture: S42.309A

## 2017-01-25 LAB — COMPREHENSIVE METABOLIC PANEL
ALT: 16 U/L — AB (ref 17–63)
AST: 23 U/L (ref 15–41)
Albumin: 4.3 g/dL (ref 3.5–5.0)
Alkaline Phosphatase: 150 U/L — ABNORMAL HIGH (ref 38–126)
Anion gap: 10 (ref 5–15)
BUN: 13 mg/dL (ref 6–20)
CHLORIDE: 105 mmol/L (ref 101–111)
CO2: 22 mmol/L (ref 22–32)
CREATININE: 0.81 mg/dL (ref 0.61–1.24)
Calcium: 9.5 mg/dL (ref 8.9–10.3)
Glucose, Bld: 84 mg/dL (ref 65–99)
POTASSIUM: 3.6 mmol/L (ref 3.5–5.1)
SODIUM: 137 mmol/L (ref 135–145)
Total Bilirubin: 1.2 mg/dL (ref 0.3–1.2)
Total Protein: 8.3 g/dL — ABNORMAL HIGH (ref 6.5–8.1)

## 2017-01-25 LAB — CBC
HCT: 38.3 % — ABNORMAL LOW (ref 39.0–52.0)
HEMOGLOBIN: 13.2 g/dL (ref 13.0–17.0)
MCH: 30.5 pg (ref 26.0–34.0)
MCHC: 34.5 g/dL (ref 30.0–36.0)
MCV: 88.5 fL (ref 78.0–100.0)
PLATELETS: 360 10*3/uL (ref 150–400)
RBC: 4.33 MIL/uL (ref 4.22–5.81)
RDW: 15.5 % (ref 11.5–15.5)
WBC: 18.1 10*3/uL — ABNORMAL HIGH (ref 4.0–10.5)

## 2017-01-25 LAB — ETHANOL

## 2017-01-25 LAB — SALICYLATE LEVEL

## 2017-01-25 LAB — ACETAMINOPHEN LEVEL: Acetaminophen (Tylenol), Serum: <10 ug/mL — ABNORMAL LOW (ref 10–30)

## 2017-01-25 MED ORDER — LURASIDONE HCL 40 MG PO TABS
80.0000 mg | ORAL_TABLET | Freq: Every day | ORAL | Status: DC
  Administered 2017-01-26: 80 mg via ORAL
  Filled 2017-01-25: qty 2

## 2017-01-25 MED ORDER — TRAZODONE HCL 50 MG PO TABS
150.0000 mg | ORAL_TABLET | Freq: Every evening | ORAL | Status: DC | PRN
  Administered 2017-01-26: 150 mg via ORAL
  Filled 2017-01-25: qty 1

## 2017-01-25 MED ORDER — VENLAFAXINE HCL ER 75 MG PO CP24
75.0000 mg | ORAL_CAPSULE | Freq: Every day | ORAL | Status: DC
  Administered 2017-01-26: 75 mg via ORAL
  Filled 2017-01-25: qty 1

## 2017-01-25 MED ORDER — GABAPENTIN 300 MG PO CAPS
900.0000 mg | ORAL_CAPSULE | Freq: Three times a day (TID) | ORAL | Status: DC
  Administered 2017-01-26 (×2): 900 mg via ORAL
  Filled 2017-01-25 (×2): qty 3

## 2017-01-25 MED ORDER — OXYCODONE-ACETAMINOPHEN 5-325 MG PO TABS
1.0000 | ORAL_TABLET | ORAL | Status: DC | PRN
  Administered 2017-01-26 (×2): 1 via ORAL
  Filled 2017-01-25 (×2): qty 1

## 2017-01-25 MED ORDER — DIVALPROEX SODIUM 500 MG PO DR TAB
500.0000 mg | DELAYED_RELEASE_TABLET | Freq: Two times a day (BID) | ORAL | Status: DC
  Administered 2017-01-26 (×2): 500 mg via ORAL
  Filled 2017-01-25 (×2): qty 1

## 2017-01-25 MED ORDER — HYDROXYZINE HCL 25 MG PO TABS
25.0000 mg | ORAL_TABLET | Freq: Three times a day (TID) | ORAL | Status: DC | PRN
  Administered 2017-01-26: 25 mg via ORAL
  Filled 2017-01-25: qty 1

## 2017-01-25 NOTE — ED Notes (Signed)
Patient dressed into burgundy scrubs.  Personal items placed in two white patient belongings bags and secured behind the nurse's station.

## 2017-01-25 NOTE — ED Notes (Signed)
Patient admitted to unit. Appear sad and depressed. Guarded. Not responding to any questions at this time. Will continue to monitor patient.
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
## 2017-01-25 NOTE — ED Notes (Signed)
Bed: WA28 Expected date:  Expected time:  Means of arrival:  Comments: Triage 5 
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
## 2017-01-26 DIAGNOSIS — F1910 Other psychoactive substance abuse, uncomplicated: Secondary | ICD-10-CM

## 2017-01-26 DIAGNOSIS — F22 Delusional disorders: Secondary | ICD-10-CM

## 2017-01-26 DIAGNOSIS — R4587 Impulsiveness: Secondary | ICD-10-CM

## 2017-01-26 DIAGNOSIS — F17210 Nicotine dependence, cigarettes, uncomplicated: Secondary | ICD-10-CM

## 2017-01-26 DIAGNOSIS — Z630 Problems in relationship with spouse or partner: Secondary | ICD-10-CM

## 2017-01-26 LAB — RAPID URINE DRUG SCREEN, HOSP PERFORMED
AMPHETAMINES: NOT DETECTED
BENZODIAZEPINES: NOT DETECTED
Barbiturates: NOT DETECTED
COCAINE: NOT DETECTED
OPIATES: NOT DETECTED
TETRAHYDROCANNABINOL: NOT DETECTED

## 2017-01-26 NOTE — Consult Note (Signed)
[facility] Consult   Reason for Consult:  Paranoia Referring Physician:  EDP Patient Identification: Danny Lester MRN:  [id number] Principal Diagnosis: Paranoia [facility]) Diagnosis:   Patient Active Problem List   Diagnosis Date Noted  . Schizophrenia ([place]) [F20.9] 12/01/2015    Priority: High  . Paranoia ([place]) [F22]   . Pelvic fracture (HCC) [S32.9XXA] 01/02/2017  . Noncompliance with medication regimen [Z91.14]   . Herpes simplex [B00.9] 02/11/2016  . Dehydration [E86.0] 12/01/2015  . Leukocytosis [D72.829] 12/01/2015  . Multiple personality disorder ([place]) [F44.81] 12/01/2015  . Carpal tunnel syndrome, bilateral [G56.03] 09/28/2015  . Neck pain [M54.2] 09/28/2015  . Low back pain [M54.5] 09/28/2015  . Plantar fasciitis, bilateral [M72.2] 09/28/2015  . Dental decay [K02.9] 09/28/2015    Total Time spent with patient: 45 minutes  Subjective:   Danny Lester is a 42 y.o. male patient admitted with paranoia.  HPI:  Pt was seen and chart reviewed with treatment team and Dr Mariea Clonts. Pt stated he thinks his wife has a life Set designer on him and wants him dead. Pt stated he and his wife are separated and he stays with his girlfriend. Pt is well known to local emergency departments and frequently visits due to somatic complaints or cocaine abuse. Today,  Pt denies suicidal/homicidal ideation, denies auditory/visual hallucinations and does not appear to be responding to internal stimuli. Pt was seen by Peer Support for assistance with substance abuse treatment programs in the community. Pt stated he is not currently taking his medications which are prescribed by [facility]. Pt is psychiatrically clear for discharge.   Past Psychiatric History: As above  Risk to Self: None Risk to Others:  None Prior Inpatient Therapy:  Unknown Prior Outpatient Therapy:  Unknown    Past Medical History:  Past Medical History:  Diagnosis Date  . Anxiety   . Broken arm   . Depression    . Multiple personality disorder ([place])   . Nerve pain   . PTSD (post-traumatic stress disorder)   . Schizophrenia, paranoid, chronic ([place])     Past Surgical History:  Procedure Laterality Date  . KNEE SURGERY     Family History: No family history on file. Family Psychiatric  History:Unknown Social History:  Social History   Substance and Sexual Activity  Alcohol Use Yes   Comment: beer occasionally     Social History   Substance and Sexual Activity  Drug Use No    Social History   Socioeconomic History  . Marital status: Single    Spouse name: None  . Number of children: None  . Years of education: None  . Highest education level: None  Social Needs  . Financial resource strain: None  . Food insecurity - worry: None  . Food insecurity - inability: None  . Transportation needs - medical: None  . Transportation needs - non-medical: None  Occupational History  . None  Tobacco Use  . Smoking status: Current Some Day Smoker    Packs/day: 0.30    Types: Cigarettes  . Smokeless tobacco: Never Used  Substance and Sexual Activity  . Alcohol use: Yes    Comment: beer occasionally  . Drug use: No  . Sexual activity: Yes    Birth control/protection: None  Other Topics Concern  . None  Social History Narrative  . None   Additional Social History:  N/A    Allergies:   Allergies  Allergen Reactions  . Penicillins Hives and Swelling    Swelling of face  Has patient had a PCN reaction causing immediate rash, facial/tongue/throat swelling, SOB or lightheadedness with hypotension: Yes Has patient had a PCN reaction causing severe rash involving mucus membranes or skin necrosis: Yes Has patient had a PCN reaction that required hospitalization No Has patient had a PCN reaction occurring within the last 10 years: No If all of the above answers are "NO", then may proceed with Cephalosporin use.   Marland Kitchen Risperdal [Risperidone] Other (See Comments)    Per pt: "starts shaking  on one side"    Labs:  Results for orders placed or performed during the hospital encounter of 01/25/17 (from the past 48 hour(s))  Comprehensive metabolic panel     Status: Abnormal   Collection Time: 01/25/17 10:42 PM  Result Value Ref Range   Sodium 137 135 - 145 mmol/L   Potassium 3.6 3.5 - 5.1 mmol/L   Chloride 105 101 - 111 mmol/L   CO2 22 22 - 32 mmol/L   Glucose, Bld 84 65 - 99 mg/dL   BUN 13 6 - 20 mg/dL   Creatinine, Ser 0.81 0.61 - 1.24 mg/dL   Calcium 9.5 8.9 - 10.3 mg/dL   Total Protein 8.3 (H) 6.5 - 8.1 g/dL   Albumin 4.3 3.5 - 5.0 g/dL   AST 23 15 - 41 U/L   ALT 16 (L) 17 - 63 U/L   Alkaline Phosphatase 150 (H) 38 - 126 U/L   Total Bilirubin 1.2 0.3 - 1.2 mg/dL   GFR calc non Af Amer >60 >60 mL/min   GFR calc Af Amer >60 >60 mL/min    Comment: (NOTE) The eGFR has been calculated using the CKD EPI equation. This calculation has not been validated in all clinical situations. eGFR's persistently <60 mL/min signify possible Chronic Kidney Disease.    Anion gap 10 5 - 15  Ethanol     Status: None   Collection Time: 01/25/17 10:42 PM  Result Value Ref Range   Alcohol, Ethyl (B) <10 <10 mg/dL    Comment:        LOWEST DETECTABLE LIMIT FOR SERUM ALCOHOL IS 10 mg/dL FOR MEDICAL PURPOSES ONLY   Salicylate level     Status: None   Collection Time: 01/25/17 10:42 PM  Result Value Ref Range   Salicylate Lvl <7.4 2.8 - 30.0 mg/dL  Acetaminophen level     Status: Abnormal   Collection Time: 01/25/17 10:42 PM  Result Value Ref Range   Acetaminophen (Tylenol), Serum <10 (L) 10 - 30 ug/mL    Comment:        THERAPEUTIC CONCENTRATIONS VARY SIGNIFICANTLY. A RANGE OF 10-30 ug/mL MAY BE AN EFFECTIVE CONCENTRATION FOR MANY PATIENTS. HOWEVER, SOME ARE BEST TREATED AT CONCENTRATIONS OUTSIDE THIS RANGE. ACETAMINOPHEN CONCENTRATIONS >150 ug/mL AT 4 HOURS AFTER INGESTION AND >50 ug/mL AT 12 HOURS AFTER INGESTION ARE OFTEN ASSOCIATED WITH TOXIC REACTIONS.   cbc      Status: Abnormal   Collection Time: 01/25/17 10:42 PM  Result Value Ref Range   WBC 18.1 (H) 4.0 - 10.5 K/uL   RBC 4.33 4.22 - 5.81 MIL/uL   Hemoglobin 13.2 13.0 - 17.0 g/dL   HCT 38.3 (L) 39.0 - 52.0 %   MCV 88.5 78.0 - 100.0 fL   MCH 30.5 26.0 - 34.0 pg   MCHC 34.5 30.0 - 36.0 g/dL   RDW 15.5 11.5 - 15.5 %   Platelets 360 150 - 400 K/uL    Current Facility-Administered Medications  Medication Dose Route Frequency Provider Last  Rate Last Dose  . divalproex (DEPAKOTE) DR tablet 500-1,000 mg  500-1,000 mg Oral BID Montine Circle, PA-C   500 mg at 01/26/17 1035  . gabapentin (NEURONTIN) capsule 900 mg  900 mg Oral TID Montine Circle, PA-C   900 mg at 01/26/17 1033  . hydrOXYzine (ATARAX/VISTARIL) tablet 25 mg  25 mg Oral TID PRN Montine Circle, PA-C   25 mg at 01/26/17 0015  . lurasidone (LATUDA) tablet 80 mg  80 mg Oral QHS Montine Circle, PA-C   80 mg at 01/26/17 0015  . oxyCODONE-acetaminophen (PERCOCET/ROXICET) 5-325 MG per tablet 1 tablet  1 tablet Oral Q4H PRN Montine Circle, PA-C   1 tablet at 01/26/17 0824  . traZODone (DESYREL) tablet 150 mg  150 mg Oral QHS PRN Montine Circle, PA-C   150 mg at 01/26/17 0016  . venlafaxine XR (EFFEXOR-XR) 24 hr capsule 75 mg  75 mg Oral Q breakfast Montine Circle, PA-C   75 mg at 01/26/17 3299   Current Outpatient Medications  Medication Sig Dispense Refill  . acyclovir (ZOVIRAX) 400 MG tablet 1 tab by mouth twice daily (Patient taking differently: Take 400 mg by mouth 2 (two) times daily. ) 60 tablet 11  . baclofen (LIORESAL) 10 MG tablet 1/2 tab by mouth twice daily only as needed (Patient taking differently: Take 10 mg by mouth 2 (two) times daily as needed for muscle spasms (Back spasms). ) 30 each 3  . divalproex (DEPAKOTE) 500 MG DR tablet Take 500-1,000 mg by mouth 2 (two) times daily. 2 in the am and 1 in the pm    . gabapentin (NEURONTIN) 600 MG tablet 1 1/2 tabs by mouth 3 times daily. (Patient taking differently: Take 900  mg by mouth 3 (three) times daily. ) 135 tablet 11  . hydrOXYzine (ATARAX/VISTARIL) 25 MG tablet Take 25 mg by mouth 3 (three) times daily as needed for anxiety.     . Lurasidone HCl (LATUDA) 120 MG TABS Take 1 tablet by mouth daily.    Marland Kitchen oxyCODONE-acetaminophen (PERCOCET/ROXICET) 5-325 MG tablet 1 tab by mouth every 6 hours only as needed for severe pain 30 tablet 0  . traZODone (DESYREL) 150 MG tablet Take 150 mg by mouth at bedtime as needed for sleep.     Marland Kitchen venlafaxine XR (EFFEXOR-XR) 37.5 MG 24 hr capsule Take 75 mg by mouth daily with breakfast.    . Diclofenac Sodium 3 % GEL Apply twice daily to affected areas (Patient not taking: Reported on 01/10/2017) 100 g 6    Musculoskeletal: Strength & Muscle Tone: within normal limits Gait & Station: normal Patient leans: N/A  Psychiatric Specialty Exam: Physical Exam  Nursing note and vitals reviewed. Constitutional: He is oriented to person, place, and time. He appears well-developed and well-nourished.  HENT:  Head: Normocephalic.  Neck: Normal range of motion.  Respiratory: Effort normal.  Musculoskeletal: Normal range of motion.  Neurological: He is alert and oriented to person, place, and time.  Psychiatric: His speech is normal and behavior is normal. Thought content normal. His mood appears anxious. He expresses impulsivity. He exhibits abnormal recent memory and abnormal remote memory.    Review of Systems  Psychiatric/Behavioral: Positive for depression and substance abuse. Negative for hallucinations, memory loss and suicidal ideas. The patient is not nervous/anxious and does not have insomnia.   All other systems reviewed and are negative.   Blood pressure (!) 115/59, pulse 64, temperature 98.2 F (36.8 C), temperature source Oral, resp. rate 18, height _0  (  1.702 m), weight 74.8 kg (165 lb), SpO2 99 %.Body mass index is 25.84 kg/m.  General Appearance: Casual  Eye Contact:  Poor  Speech:  Clear and Coherent  Volume:   Decreased  Mood:  Angry and Irritable  Affect:  Congruent  Thought Process:  Coherent, Goal Directed and Linear  Orientation:  Full (Time, Place, and Person)  Thought Content:  Logical  Suicidal Thoughts:  No  Homicidal Thoughts:  No  Memory:  Immediate;   Good Recent;   Good Remote;   Fair  Judgement:  Fair  Insight:  Lacking  Psychomotor Activity:  Decreased  Concentration:  Concentration: Fair and Attention Span: Fair  Recall:  Good  Fund of Knowledge:  Good  Language:  Good  Akathisia:  No  Handed:  Right  AIMS (if indicated):    N/A  Assets:  Communication Skills Housing Social Support  ADL's:  Intact  Cognition:  WNL  Sleep:     N/A     Treatment Plan Summary: Plan Paranoia  Discharge home Follow up with [facility] for medication management and therapy Take all medications as prescribed Avoid the use of alcohol and illicit drugs  Disposition: No evidence of imminent risk to self or others at present.   Patient does not meet criteria for psychiatric inpatient admission. Supportive therapy provided about ongoing stressors. Discussed crisis plan, support from social network, calling 911, coming to the Emergency Department, and calling Suicide Hotline.  Ethelene Hal, NP 01/26/2017 11:32 AM   Patient seen face-to-face for psychiatric evaluation, chart reviewed and case discussed with the physician extender and developed treatment plan. Reviewed the information documented and agree with the treatment plan.  Buford Dresser, DO

## 2017-01-26 NOTE — BHH Suicide Risk Assessment (Signed)
Suicide Risk Assessment  Discharge Assessment   [facility]   Principal Problem: Paranoia [facility](HCC) Discharge Diagnoses:  Patient Active Problem List   Diagnosis Date Noted  . Schizophrenia (HCC) [F20.9] 12/01/2015    Priority: High  . Paranoia (HCC) [F22]   . Pelvic fracture (HCC) [S32.9XXA] 01/02/2017  . Noncompliance with medication regimen [Z91.14]   . Herpes simplex [B00.9] 02/11/2016  . Dehydration [E86.0] 12/01/2015  . Leukocytosis [D72.829] 12/01/2015  . 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 Carpal tunnel syndrome, bilateral [G56.03] 09/28/2015  . Neck pain [M54.2] 09/28/2015  . Low back pain [M54.5] 09/28/2015  . Plantar fasciitis, bilateral [M72.2] 09/28/2015  . Dental decay [K02.9] 09/28/2015    Total Time spent with patient: 45 minutes  Musculoskeletal: Strength & Muscle Tone: within normal limits Gait & Station: normal Patient leans: N/A  Psychiatric Specialty Exam: Physical Exam  Constitutional: He appears well-developed and well-nourished.  HENT:  Head: Normocephalic.  Respiratory: Effort normal.  Musculoskeletal: Normal range of motion.  Neurological: He is alert.  Psychiatric: His speech is normal and behavior is normal. Thought content normal. His mood appears anxious. He expresses impulsivity. He exhibits abnormal recent memory and abnormal remote memory.   Review of Systems  Psychiatric/Behavioral: Positive for depression and substance abuse. Negative for hallucinations, memory loss and suicidal ideas. The patient is not nervous/anxious and does not have insomnia.   All other systems reviewed and are negative.  Blood pressure (!) 115/59, pulse 64, temperature 98.2 F (36.8 C), temperature source Oral, resp. rate 18, height 5\' 7"  (1.702 m), weight 74.8 kg (165 lb), SpO2 99 %.Body mass index is 25.84 kg/m. General Appearance: Casual Eye Contact:  Poor Speech:  Clear and Coherent Volume:  Decreased Mood:   Angry and Irritable Affect:  Congruent and Depressed Thought Process:  Coherent, Goal Directed and Linear Orientation:  Full (Time, Place, and Person) Thought Content:  Logical Suicidal Thoughts:  No Homicidal Thoughts:  No Memory:  Immediate;   Good Recent;   Good Remote;   Fair Judgement:  Fair Insight:  Lacking Psychomotor Activity:  Decreased Concentration:  Concentration: Fair and Attention Span: Fair Recall:  Good Fund of Knowledge:  Good Language:  Good Akathisia:  No Handed:  Right AIMS (if indicated):    Assets:  Communication Skills Housing Social Support ADL's:  Intact Cognition:  WNL   Mental Status Per Nursing Assessment::   On Admission:   Paranoia  Demographic Factors:  Male, Low socioeconomic status and Unemployed  Loss Factors: Legal issues and Financial problems/change in socioeconomic status  Historical Factors: Impulsivity  Risk Reduction Factors:   Sense of responsibility to family and Living with another person, especially a relative  Continued Clinical Symptoms:  Alcohol/Substance Abuse/Dependencies Schizophrenia:   Paranoid or undifferentiated type More than one psychiatric diagnosis  Cognitive Features That Contribute To Risk:  Closed-mindedness    Suicide Risk:  Minimal: No identifiable suicidal ideation.  Patients presenting with no risk factors but with morbid ruminations; may be classified as minimal risk based on the severity of the depressive symptoms    Plan Of Care/Follow-up recommendations:  Activity:  as tolerated Diet:  Heart Healthy  Laveda AbbeLaurie Britton [place], NP 01/26/2017, 11:36 AM
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
## 2017-01-26 NOTE — B[facility] Suicide Risk Assessment (Signed)
Suicide Risk Assessment  Discharge Assessment   [facility]   Principal Problem: Paranoia [facility](HCC) Discharge Diagnoses:  Patient Active Problem List   Diagnosis Date Noted  . Schizophrenia (HCC) [F20.9] 12/01/2015    Priority: High  . Paranoia (HCC) [F22]   . Pelvic fracture (HCC) [S32.9XXA] 01/02/2017  . Noncompliance with medication regimen [Z91.14]   . Herpes simplex [B00.9] 02/11/2016  . Dehydration [E86.0] 12/01/2015  . Leukocytosis [D72.829] 12/01/2015  . 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 Carpal tunnel syndrome, bilateral [G56.03] 09/28/2015  . Neck pain [M54.2] 09/28/2015  . Low back pain [M54.5] 09/28/2015  . Plantar fasciitis, bilateral [M72.2] 09/28/2015  . Dental decay [K02.9] 09/28/2015    Total Time spent with patient: 45 minutes  Musculoskeletal: Strength & Muscle Tone: within normal limits Gait & Station: normal Patient leans: N/A  Psychiatric Specialty Exam: Physical Exam  Constitutional: He appears well-developed and well-nourished.  HENT:  Head: Normocephalic.  Respiratory: Effort normal.  Musculoskeletal: Normal range of motion.  Neurological: He is alert.  Psychiatric: His speech is normal and behavior is normal. Thought content normal. His mood appears anxious. He expresses impulsivity. He exhibits abnormal recent memory and abnormal remote memory.   Review of Systems  Psychiatric/Behavioral: Positive for depression and substance abuse. Negative for hallucinations, memory loss and suicidal ideas. The patient is not nervous/anxious and does not have insomnia.   All other systems reviewed and are negative.  Blood pressure (!) 115/59, pulse 64, temperature 98.2 F (36.8 C), temperature source Oral, resp. rate 18, height 5\' 7"  (1.702 m), weight 74.8 kg (165 lb), SpO2 99 %.Body mass index is 25.84 kg/m. General Appearance: Casual Eye Contact:  Poor Speech:  Clear and Coherent Volume:  Decreased Mood:   Angry and Irritable Affect:  Congruent and Depressed Thought Process:  Coherent, Goal Directed and Linear Orientation:  Full (Time, Place, and Person) Thought Content:  Logical Suicidal Thoughts:  No Homicidal Thoughts:  No Memory:  Immediate;   Good Recent;   Good Remote;   Fair Judgement:  Fair Insight:  Lacking Psychomotor Activity:  Decreased Concentration:  Concentration: Fair and Attention Span: Fair Recall:  Good Fund of Knowledge:  Good Language:  Good Akathisia:  No Handed:  Right AIMS (if indicated):    Assets:  Communication Skills Housing Social Support ADL's:  Intact Cognition:  WNL   Mental Status Per Nursing Assessment::   On Admission:   Paranoid  Demographic Factors:  Male, Low socioeconomic status and Unemployed  Loss Factors: Legal issues and Financial problems/change in socioeconomic status  Historical Factors: Impulsivity  Risk Reduction Factors:   Sense of responsibility to family  Continued Clinical Symptoms:  Depression:   Impulsivity Alcohol/Substance Abuse/Dependencies Schizophrenia:   Paranoid or undifferentiated type More than one psychiatric diagnosis Previous Psychiatric Diagnoses and Treatments  Cognitive Features That Contribute To Risk:  Closed-mindedness    Suicide Risk:  Minimal: No identifiable suicidal ideation.  Patients presenting with no risk factors but with morbid ruminations; may be classified as minimal risk based on the severity of the depressive symptoms    Plan Of Care/Follow-up recommendations:  Activity:  as tolerated Diet:  Heart Healthy  Danny AbbeLaurie Britton [name], NP 01/26/2017, 12:52 PM

## 2017-01-26 NOTE — Discharge Instructions (Signed)
For your mental health needs, you are advised to continue treatment with [facility]: ° °     [facility] °     [address] °     [place], [place] [address] °     [phone] ° °To help you maintain a sober lifestyle, a substance abuse treatment program may be beneficial to you.  Contact one of the following facilities at your earliest opportunity to ask about enrolling: ° °RESIDENTIAL PROGRAMS: ° °     [facility] °     [address] °     [place], [place] [address] °     [phone] ° °     [facility] °     [address] °     [place], [place] [address] °     [phone] ° °     [facility] °     [address] °     [place], [place] [address] °     [phone] ° °OUTPATIENT PROGRAMS: ° °     [facility] °     [address] °     [place], [place] [address] °     [phone] °     New patients are seen at the walk-in clinic every Tuesday from 9:00 am - 12:00 pm °

## 2017-01-26 NOTE — H&P (Signed)
Behavioral Health Medical Screening Exam  Danny Lester is an 42 y.o. male.  Total Time spent with patient: 20 minutes  Psychiatric Specialty Exam: Physical Exam  Constitutional: He is oriented to person, place, and time. He appears well-developed and well-nourished. No distress.  HENT:  Head: Normocephalic and atraumatic.  Right Ear: External ear normal.  Left Ear: External ear normal.  Eyes: Conjunctivae are normal. Right eye exhibits no discharge. Left eye exhibits no discharge. No scleral icterus.  Cardiovascular: Normal rate, regular rhythm and normal heart sounds.  Respiratory: Breath sounds normal. No respiratory distress.  Musculoskeletal: Normal range of motion.       Arms: Neurological: He is alert and oriented to person, place, and time.  Skin: Skin is warm and dry. He is not diaphoretic.  Psychiatric: His speech is normal. His mood appears anxious. His affect is labile. He is agitated. He is not actively hallucinating. Thought content is paranoid. Thought content is not delusional. Cognition and memory are normal. He expresses impulsivity and inappropriate judgment. He exhibits a depressed mood. He expresses suicidal ideation. He expresses no homicidal ideation.    Review of Systems  Psychiatric/Behavioral: Positive for depression and suicidal ideas. Negative for hallucinations, memory loss and substance abuse. The patient is nervous/anxious and has insomnia.     Blood pressure 122/78, pulse 96, temperature 98.6 F (37 C), resp. rate 18.There is no height or weight on file to calculate BMI.  General Appearance: Casual and Disheveled  Eye Contact:  Minimal  Speech:  Clear and Coherent and Normal Rate  Volume:  Normal  Mood:  Angry, Anxious, Depressed, Hopeless, Irritable and Worthless  Affect:  Blunt and Congruent  Thought Process:  Coherent and Goal Directed  Orientation:  Full (Time, Place, and Person)  Thought Content:  Paranoid Ideation and Rumination  Suicidal  Thoughts:  Yes.  without intent/plan  Homicidal Thoughts:  No  Memory:  Immediate;   Fair Recent;   Fair  Judgement:  Impaired  Insight:  Fair  Psychomotor Activity:  Normal  Concentration: Concentration: Fair and Attention Span: Fair  Recall:  [employer]  Fund of Knowledge:Fair  Language: Good  Akathisia:  No  Handed:  Right  AIMS (if indicated):     Assets:  Communication Skills Desire for Improvement Leisure Time Physical Health  Sleep:       Musculoskeletal: Strength & Muscle Tone: within normal limits Gait & Station: normal   Blood pressure 122/78, pulse 96, temperature 98.6 F (37 C), resp. rate 18.  Recommendations:  Based on my evaluation the patient does not appear to have an emergency medical condition.  Jackelyn PolingJason A Andalyn Heckstall, NP 01/26/2017, 12:34 AM

## 2017-01-26 NOTE — BH Assessment (Signed)
[facility]  Per Juanetta BeetsJacqueline Norman, DO, this pt does not require psychiatric hospitalization at this time.  [facility].  Pt is also to be provided with referral information for area substance abuse treatment providers.  This has been included in pt's discharge instructions.  Pt would also benefit from seeing Peer Support Specialists; they will be asked to speak to pt.  Pt's nurse, Misty StanleyStacey, has been notified.  Doylene Canninghomas Nyree Applegate, MA Triage Specialist [phone]

## 2017-01-27 ENCOUNTER — Other Ambulatory Visit: Payer: Self-pay

## 2017-01-27 ENCOUNTER — Emergency Department (HOSPITAL_COMMUNITY)
Admission: EM | Admit: 2017-01-27 | Discharge: 2017-01-28 | Disposition: A | Discharge status: Home / Self Care | Payer: Medicaid Other | Attending: Emergency Medicine | Admitting: Emergency Medicine

## 2017-01-27 ENCOUNTER — Encounter (HOSPITAL_COMMUNITY): Payer: Self-pay | Admitting: *Deleted

## 2017-01-27 DIAGNOSIS — F4321 Adjustment disorder with depressed mood: Secondary | ICD-10-CM | POA: Diagnosis present

## 2017-01-27 DIAGNOSIS — R45851 Suicidal ideations: Secondary | ICD-10-CM | POA: Diagnosis not present

## 2017-01-27 DIAGNOSIS — F17210 Nicotine dependence, cigarettes, uncomplicated: Secondary | ICD-10-CM | POA: Diagnosis not present

## 2017-01-27 DIAGNOSIS — Z79899 Other long term (current) drug therapy: Secondary | ICD-10-CM | POA: Insufficient documentation

## 2017-01-27 DIAGNOSIS — F329 Major depressive disorder, single episode, unspecified: Secondary | ICD-10-CM | POA: Diagnosis present

## 2017-01-27 MED ORDER — ACETAMINOPHEN 325 MG PO TABS: 650.0000 mg | ORAL_TABLET | ORAL | Status: DC | PRN

## 2017-01-27 NOTE — ED Notes (Signed)
Pt informed EDP has been notified of arrival.

## 2017-01-27 NOTE — ED Notes (Signed)
Pt requesting his regular meds.  Informed will speak to PA-C.  Pt verbalized understanding.
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
## 2017-01-27 NOTE — ED Triage Notes (Signed)
Pt brought in by [employer].  Pt stated "I'm basically homeless.  I just left here yesterday and I tried to tell them.  [facility] but that was a [facility].  I was thinking about jumping out in front of a car or a train.  I was thinking about hurting somebody.  I don't get my SSI anymore because my ex-wife's limit was over the limit."
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
## 2017-01-27 NOTE — ED Notes (Addendum)
Dr. Donnald GarrePfeiffer notified pt d/'d yesterday.  Per Dr. Donnald GarrePfeiffer, will look at chart to determine orders.

## 2017-01-28 DIAGNOSIS — F4321 Adjustment disorder with depressed mood: Secondary | ICD-10-CM | POA: Diagnosis present

## 2017-01-28 LAB — ETHANOL: Alcohol, Ethyl (B): <10 mg/dL (ref <10)

## 2017-01-28 MED ORDER — GABAPENTIN 300 MG PO CAPS: 600.0000 mg | ORAL_CAPSULE | Freq: Three times a day (TID) | ORAL | Status: DC

## 2017-01-28 MED ORDER — GABAPENTIN 300 MG PO CAPS
600.0000 mg | ORAL_CAPSULE | Freq: Three times a day (TID) | ORAL | Status: DC
  Administered 2017-01-28 (×2): 600 mg via ORAL
  Filled 2017-01-28 (×2): qty 2

## 2017-01-28 MED ORDER — HYDROXYZINE HCL 25 MG PO TABS: 25.0000 mg | ORAL_TABLET | Freq: Three times a day (TID) | ORAL | Status: DC | PRN

## 2017-01-28 MED ORDER — IBUPROFEN 800 MG PO TABS: 800.0000 mg | ORAL_TABLET | Freq: Three times a day (TID) | ORAL | Status: DC | PRN

## 2017-01-28 MED ORDER — ACYCLOVIR 400 MG PO TABS
400.0000 mg | ORAL_TABLET | Freq: Two times a day (BID) | ORAL | Status: DC
  Administered 2017-01-28 (×2): 400 mg via ORAL
  Filled 2017-01-28 (×2): qty 1

## 2017-01-28 MED ORDER — IBUPROFEN 200 MG PO TABS: 600.0000 mg | ORAL_TABLET | Freq: Three times a day (TID) | ORAL | Status: DC | PRN

## 2017-01-28 MED ORDER — TRAZODONE HCL 50 MG PO TABS: 150.0000 mg | ORAL_TABLET | Freq: Every evening | ORAL | Status: DC | PRN

## 2017-01-28 MED ORDER — OXYCODONE-ACETAMINOPHEN 5-325 MG PO TABS
1.0000 | ORAL_TABLET | Freq: Four times a day (QID) | ORAL | Status: DC | PRN
  Administered 2017-01-28: 1 via ORAL
  Filled 2017-01-28: qty 1

## 2017-01-28 MED ORDER — LURASIDONE HCL 40 MG PO TABS
120.0000 mg | ORAL_TABLET | Freq: Every day | ORAL | Status: DC
  Administered 2017-01-28: 120 mg via ORAL
  Filled 2017-01-28: qty 3

## 2017-01-28 MED ORDER — LURASIDONE HCL 40 MG PO TABS: 120.0000 mg | ORAL_TABLET | Freq: Every day | ORAL | Status: DC

## 2017-01-28 NOTE — Progress Notes (Signed)
Patient ID: Danny Lester, male   DOB: Dec 22, 1975, 42 y.o.   MRN: [id number]  42 yo male who presented to the ED with depression and suicidal ideations.  He was here this week with the same presentation.  Danny Lester was discharged on Friday with resources for [facility] and Rx.  Today, he denies suicidal/homicidal ideations, hallucinations, or substance abuse.  He is interested in homeless resources, provided.  Court date tomorrow, encouraged him to attend.  Nanine MeansJamison Lord, PMHNP

## 2017-01-28 NOTE — ED Notes (Signed)
Pt d/c home per MD order. Discharge summary reviewed with pt. Pt verbalizes understanding. Pt denies SI/HI/AVH. Personal property returned to pt. Pt signed e-signature. Ambulatory off unit with [facility].  
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
## 2017-01-28 NOTE — ED Notes (Signed)
Pt refuses to provide urine stating "I will when I'm ready."

## 2017-01-28 NOTE — Patient Outreach (Signed)
[facility] provided substance use recovery support and homeless shelter resources. [facility]PSS met with the patient two days ago on Friday 01/26/17. Patient was clean from alcohol/drugs on his arrival to the ED.

## 2017-01-28 NOTE — BHH Suicide Risk Assessment (Signed)
Suicide Risk Assessment  Discharge Assessment   [facility]   Principal Problem: Adjustment disorder with depressed mood Discharge Diagnoses:  Patient Active Problem List   Diagnosis Date Noted  . Adjustment disorder with depressed mood [F43.21] 01/28/2017    Priority: High  . Pelvic fracture (HCC) [S32.9XXA] 01/02/2017  . Herpes simplex [B00.9] 02/11/2016  . Leukocytosis [D72.829] 12/01/2015  . Carpal tunnel syndrome, bilateral [G56.03] 09/28/2015  . Plantar fasciitis, bilateral [M72.2] 09/28/2015    Total Time spent with patient: 45 minutes  Musculoskeletal: Strength & Muscle Tone: within normal limits Gait & Station: normal Patient leans: N/A  Psychiatric Specialty Exam:   Blood pressure (!) 124/59, pulse (!) 54, temperature 98.2 F (36.8 C), temperature source Oral, resp. rate 18, height 5\' 7"  (1.702 m), weight 74.8 kg (165 lb), SpO2 100 %.Body mass index is 25.84 kg/m.  General Appearance: Casual  Eye Contact::  Good  Speech:  Normal Rate409  Volume:  Normal  Mood:  Depressed, mild  Affect:  Congruent  Thought Process:  Coherent and Descriptions of Associations: Intact  Orientation:  Full (Time, Place, and Person)  Thought Content:  WDL and Logical  Suicidal Thoughts:  No  Homicidal Thoughts:  No  Memory:  Immediate;   Good Recent;   Good Remote;   Good  Judgement:  Fair  Insight:  Fair  Psychomotor Activity:  Normal  Concentration:  Good  Recall:  Good  Fund of Knowledge:Fair  Language: Good  Akathisia:  No  Handed:  Right  AIMS (if indicated):     Assets:  Leisure Time Physical Health Resilience  Sleep:     Cognition: WNL  ADL's:  Intact   Mental Status Per Nursing Assessment::   On Admission:   42 yo male who presented to the ED with depression and suicidal ideations.  He was here this week with the same presentation.  Pau was discharged on Friday with resources for [facility] and Rx.  Today, he denies suicidal/homicidal  ideations, hallucinations, or substance abuse.  He is interested in homeless resources, provided.  Court date tomorrow, encouraged him to attend.  Demographic Factors:  Male  Loss Factors: Legal issues  Historical Factors: NA  Risk Reduction Factors:   Sense of responsibility to family and Positive social support  Continued Clinical Symptoms:  Depression, mild  Cognitive Features That Contribute To Risk:  None    Suicide Risk:  Minimal: No identifiable suicidal ideation.  Patients presenting with no risk factors but with morbid ruminations; may be classified as minimal risk based on the severity of the depressive symptoms  Follow-up Information    Schedule an appointment as soon as possible for a visit  with Julieanne MansonMulberry, Elizabeth, MD.   Specialty:  Internal Medicine Why:  Please follow-up in 1 week after discharge for repeat laboratory test of your alkaline phosphatase, 1 of your liver enzymes. Contact information: [address] [place] [address] [phone]        [facility].   Contact information: [address] [place] [phone]       [facility].   Specialty:  Family Medicine Contact information: [address] [place] [id number] [phone]          Plan Of Care/Follow-up recommendations:  Activity:  as tolerated Diet:  heart healthy diet  LORD, JAMISON, NP 01/28/2017, 10:25 AM

## 2017-01-28 NOTE — Discharge Instructions (Addendum)
1 of your lab numbers is elevated as of your lab work yesterday.  It can be elevated when there are problems with the liver or if you have high turnover of the bone cells in your body.  I feel that this elevation is likely due to your multiple healing fractures, however I would like this lab work retested in 1 week by her primary care provider.  You can either go to your primary doctor, Dr. Delrae AlfredMulberry, or go to [facility] urgent care.

## 2017-01-28 NOTE — ED Notes (Signed)
Pt stated "I take gabapentin 900 mg.  It's been a while now I've been taking 900.  My primary started it."

## 2017-01-28 NOTE — ED Notes (Signed)
SBAR Report received from previous nurse. Pt received calm and visible on unit. Pt did not answer writers assessment questions related to current SI/ HI, A/V H, depression, anxiety, or pain at this time, and appears otherwise stable and free of distress. Pt reminded of camera surveillance, q 15 min rounds, and rules of the milieu. Will continue to assess.

## 2017-01-28 NOTE — ED Notes (Signed)
[facility] Expected date:  Expected time:  Means of arrival:  Comments: [place]

## 2017-01-29 NOTE — Care Management Note (Signed)
Case Management Note  CM consulted for follow up and medicaiton assistance with pt; consult received after pt D/C.  CM noted that pt has an appointment already scheduled with his PCP.  Pt can gain medicaiton assistance from PCP office as well. CM additionally noted that pt was given homeless resources and substance abuse information. [facility] program as per EDPA request was not evaluated or completed due to pt no longer being at this facility.  No further CM needs noted at this time.  01/29/2017 10:42AM

## 2017-02-03 ENCOUNTER — Other Ambulatory Visit: Payer: Self-pay

## 2017-02-03 ENCOUNTER — Encounter (HOSPITAL_COMMUNITY): Payer: Self-pay | Admitting: Emergency Medicine

## 2017-02-03 ENCOUNTER — Emergency Department (HOSPITAL_COMMUNITY)
Admission: EM | Admit: 2017-02-03 | Discharge: 2017-02-03 | Disposition: A | Discharge status: Home / Self Care | Payer: Medicaid Other | Attending: Emergency Medicine | Admitting: Emergency Medicine

## 2017-02-03 DIAGNOSIS — Z590 Homelessness: Secondary | ICD-10-CM | POA: Insufficient documentation

## 2017-02-03 DIAGNOSIS — R45850 Homicidal ideations: Secondary | ICD-10-CM | POA: Insufficient documentation

## 2017-02-03 DIAGNOSIS — Z79899 Other long term (current) drug therapy: Secondary | ICD-10-CM | POA: Diagnosis not present

## 2017-02-03 DIAGNOSIS — F17210 Nicotine dependence, cigarettes, uncomplicated: Secondary | ICD-10-CM | POA: Diagnosis not present

## 2017-02-03 DIAGNOSIS — R45851 Suicidal ideations: Secondary | ICD-10-CM | POA: Diagnosis not present

## 2017-02-03 DIAGNOSIS — F209 Schizophrenia, unspecified: Secondary | ICD-10-CM | POA: Insufficient documentation

## 2017-02-03 DIAGNOSIS — F4310 Post-traumatic stress disorder, unspecified: Secondary | ICD-10-CM | POA: Diagnosis present

## 2017-02-03 DIAGNOSIS — F4329 Adjustment disorder with other symptoms: Secondary | ICD-10-CM

## 2017-02-03 DIAGNOSIS — F4325 Adjustment disorder with mixed disturbance of emotions and conduct: Secondary | ICD-10-CM | POA: Diagnosis not present

## 2017-02-03 LAB — COMPREHENSIVE METABOLIC PANEL
ALBUMIN: 4.1 g/dL (ref 3.5–5.0)
ALT: 14 U/L — ABNORMAL LOW (ref 17–63)
AST: 21 U/L (ref 15–41)
Alkaline Phosphatase: 131 U/L — ABNORMAL HIGH (ref 38–126)
Anion gap: 12 (ref 5–15)
BUN: 13 mg/dL (ref 6–20)
CO2: 21 mmol/L — AB (ref 22–32)
Calcium: 9.2 mg/dL (ref 8.9–10.3)
Chloride: 104 mmol/L (ref 101–111)
Creatinine, Ser: 0.9 mg/dL (ref 0.61–1.24)
GFR calc Af Amer: >60 mL/min (ref >60)
GFR calc non Af Amer: >60 mL/min (ref >60)
GLUCOSE: 96 mg/dL (ref 65–99)
POTASSIUM: 3.8 mmol/L (ref 3.5–5.1)
SODIUM: 137 mmol/L (ref 135–145)
Total Bilirubin: 0.6 mg/dL (ref 0.3–1.2)
Total Protein: 7.8 g/dL (ref 6.5–8.1)

## 2017-02-03 LAB — CBC
HEMATOCRIT: 38.5 % — AB (ref 39.0–52.0)
HEMATOCRIT: 42.6 % (ref 39.0–52.0)
HEMOGLOBIN: 13 g/dL (ref 13.0–17.0)
Hemoglobin: 14.5 g/dL (ref 13.0–17.0)
MCH: 28.7 pg (ref 26.0–34.0)
MCH: 30.3 pg (ref 26.0–34.0)
MCHC: 33.8 g/dL (ref 30.0–36.0)
MCHC: 34 g/dL (ref 30.0–36.0)
MCV: 85 fL (ref 78.0–100.0)
MCV: 88.9 fL (ref 78.0–100.0)
Platelets: 333 10*3/uL (ref 150–400)
Platelets: 256 10*3/uL (ref 150–400)
RBC: 4.53 MIL/uL (ref 4.22–5.81)
RBC: 4.79 MIL/uL (ref 4.22–5.81)
RDW: 13.6 % (ref 11.5–15.5)
RDW: 15.1 % (ref 11.5–15.5)
WBC: 7.1 10*3/uL (ref 4.0–10.5)
WBC: 14.5 10*3/uL — ABNORMAL HIGH (ref 4.0–10.5)

## 2017-02-03 LAB — RAPID URINE DRUG SCREEN, HOSP PERFORMED
AMPHETAMINES: NOT DETECTED
BARBITURATES: NOT DETECTED
BENZODIAZEPINES: NOT DETECTED
COCAINE: POSITIVE — AB
Opiates: NOT DETECTED
TETRAHYDROCANNABINOL: POSITIVE — AB

## 2017-02-03 LAB — ACETAMINOPHEN LEVEL: Acetaminophen (Tylenol), Serum: <10 ug/mL — ABNORMAL LOW (ref 10–30)

## 2017-02-03 LAB — ETHANOL: Alcohol, Ethyl (B): <10 mg/dL (ref <10)

## 2017-02-03 LAB — SALICYLATE LEVEL: Salicylate Lvl: <7 mg/dL (ref 2.8–30.0)

## 2017-02-03 NOTE — ED Notes (Signed)
Dr. Patria Maneampos came to triage and evaluated pt.  Pt angry about being discharged. Pt refused to sign discharge instructions.  [employer] and security standing by and off-duty [employer] offered to get pt a ride to the shelter with [facility] that brought him to the ED and dropped him off.  Pt very hostile, yelling and cursing that he is going to go jump in front of a train or a car.  PT refused to take his 2 totes of belongings with him and states, "I will be right back after I jump in front of a car."  [employer] told pt that he had to take his stuff with him so he rolled it in the wheelchair outside of Pod A doors and left it sitting at door in waiting room.  [facility] calling him a "f--king bi-ch".

## 2017-02-03 NOTE — ED Provider Notes (Signed)
[facility] EMERGENCY DEPARTMENT Provider Note   CSN: [id number] Arrival date & time: 02/03/17  0229     History   Chief Complaint Chief Complaint  Patient presents with  . Homicidal    HPI Danny Lester is a 42 y.o. male.  HPI Pt is a 42 yo male with a hx of PTSD and schizophrenia who presents complaining of increasing stressors in his life that are making him upset and angry.  This is his 3rd visit to the hospital in the past 9 days. Seen by the [facility] team and felt stable for discharge. No hallucinations. Compliant with his medications. Currently homeless.   Past Medical History:  Diagnosis Date  . Anxiety   . Broken arm   . Depression   . 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 Nerve pain   . PTSD (post-traumatic stress disorder)   . Schizophrenia, paranoid, chronic [facility])     Patient Active Problem List   Diagnosis Date Noted  . Adjustment disorder with depressed mood 01/28/2017  . Pelvic fracture (HCC) 01/02/2017  . Herpes simplex 02/11/2016  . Leukocytosis 12/01/2015  . Carpal tunnel syndrome, bilateral 09/28/2015  . Plantar fasciitis, bilateral 09/28/2015    Past Surgical History:  Procedure Laterality Date  . KNEE SURGERY         Home Medications    Prior to Admission medications   Medication Sig Start Date End Date Taking? Authorizing Provider  acyclovir (ZOVIRAX) 400 MG tablet 1 tab by mouth twice daily Patient taking differently: Take 400 mg by mouth 2 (two) times daily.  02/11/16   Julieanne Manson, MD  baclofen (LIORESAL) 10 MG tablet 1/2 tab by mouth twice daily only as needed Patient taking differently: Take 10 mg by mouth 2 (two) times daily as needed for muscle spasms (Back spasms).  12/04/16   Julieanne Manson, MD  divalproex (DEPAKOTE) 500 MG DR tablet Take 500-1,000 mg by mouth 2 (two) times daily. 2 in the am and 1 in the pm    [provider]  gabapentin (NEURONTIN) 600 MG tablet 1 1/2 tabs by mouth 3 times  daily. Patient taking differently: Take 900 mg by mouth 3 (three) times daily.  08/23/16   Julieanne Manson, MD  hydrOXYzine (ATARAX/VISTARIL) 25 MG tablet Take 25 mg by mouth 3 (three) times daily as needed for anxiety.     [provider]  Lurasidone HCl (LATUDA) 120 MG TABS Take 1 tablet by mouth daily.    [provider]  traZODone (DESYREL) 150 MG tablet Take 150 mg by mouth at bedtime as needed for sleep.     [provider]  venlafaxine XR (EFFEXOR-XR) 37.5 MG 24 hr capsule Take 75 mg by mouth daily with breakfast.    [provider]    Family History No family history on file.  Social History Social History   Tobacco Use  . Smoking status: Current Some Day Smoker    Packs/day: 0.30    Types: Cigarettes  . Smokeless tobacco: Never Used  Substance Use Topics  . Alcohol use: Yes    Comment: beer occasionally  . Drug use: Yes    Types: Marijuana, Cocaine    Comment: Pt stated "I have a long hx of drug abuse."     Allergies   Penicillins and Risperdal [risperidone]   Review of Systems Review of Systems  All other systems reviewed and are negative.    Physical Exam Updated Vital Signs BP 134/89 (BP Location: Right  Arm)   Pulse 93   Temp 98.4 F (36.9 C) (Oral)   Resp 20   SpO2 99%   Physical Exam  Constitutional: He is oriented to person, place, and time. He appears well-developed and well-nourished.  HENT:  Head: Normocephalic and atraumatic.  Eyes: EOM are normal.  Neck: Normal range of motion.  Cardiovascular: Normal rate.  Pulmonary/Chest: Effort normal. No respiratory distress.  Abdominal: Soft. He exhibits no distension. There is no tenderness.  Musculoskeletal: Normal range of motion.  Neurological: He is alert and oriented to person, place, and time.  Skin: Skin is warm and dry.  Psychiatric: He has a normal mood and affect. Judgment normal.  Nursing note and vitals reviewed.    ED Treatments / Results    Labs (all labs ordered are listed, but only abnormal results are displayed) Labs Reviewed  COMPREHENSIVE METABOLIC PANEL - Abnormal; Notable for the following components:      Result Value   CO2 21 (*)    ALT 14 (*)    Alkaline Phosphatase 131 (*)    All other components within normal limits  ACETAMINOPHEN LEVEL - Abnormal; Notable for the following components:   Acetaminophen (Tylenol), Serum <10 (*)    All other components within normal limits  CBC - Abnormal; Notable for the following components:   HCT 38.5 (*)    All other components within normal limits  RAPID URINE DRUG SCREEN, HOSP PERFORMED - Abnormal; Notable for the following components:   Cocaine POSITIVE (*)    Tetrahydrocannabinol POSITIVE (*)    All other components within normal limits  CBC - Abnormal; Notable for the following components:   WBC 14.5 (*)    All other components within normal limits  ETHANOL  SALICYLATE LEVEL    EKG  EKG Interpretation None       Radiology No results found.  Procedures Procedures (including critical care time)  Medications Ordered in ED Medications - No data to display   Initial Impression / Assessment and Plan / ED Course  I have reviewed the triage vital signs and the nursing notes.  Pertinent labs & imaging results that were available during my care of the patient were reviewed by me and considered in my medical decision making (see chart for details).     Pt calm and cooperative initially. Interactive, good eye contact, calm.  When I told him I was going to discharge him he became upset.  [facility].  I don't believe placement in a psychiatric hospital is going to help him.  He has increasing stressors at home with multiple bad relations. Discharged home in good condition.   Final Clinical Impressions(s) / ED Diagnoses   Final diagnoses:  Adjustment disorder with disturbance of emotion    ED Discharge Orders    None       Azalia Bilisampos, Jennier Schissler,  MD 02/03/17 [phone]

## 2017-02-03 NOTE — ED Triage Notes (Signed)
Pt states he was at a house staying with a friend and got upset over loud music.  At some point his birth certificate and copy of social security card was missing.  The police was called and pt was upset and states a woman at the house took his documents. Pt states he is homicidal towards the woman.  Pt states, "I want to f--k her up.  I have already been here before because I wanted to f--k someone up and they don't seem to believe me."  Also stated, "after I f--ck them up I will take care of myself."  Reports plan to walk in front of a train or car.  Pt has 2 totes of belongings and 2 patient belonging bags with clothing.

## 2017-02-10 ENCOUNTER — Other Ambulatory Visit: Payer: Self-pay | Admitting: Internal Medicine

## 2017-02-12 NOTE — Telephone Encounter (Signed)
To Dr. Mulberry for approval 

## 2017-02-16 ENCOUNTER — Other Ambulatory Visit: Payer: Self-pay

## 2017-02-16 ENCOUNTER — Encounter (HOSPITAL_COMMUNITY): Payer: Self-pay | Admitting: Emergency Medicine

## 2017-02-16 DIAGNOSIS — F419 Anxiety disorder, unspecified: Secondary | ICD-10-CM | POA: Insufficient documentation

## 2017-02-16 DIAGNOSIS — F1910 Other psychoactive substance abuse, uncomplicated: Secondary | ICD-10-CM | POA: Insufficient documentation

## 2017-02-16 DIAGNOSIS — F17210 Nicotine dependence, cigarettes, uncomplicated: Secondary | ICD-10-CM | POA: Insufficient documentation

## 2017-02-16 DIAGNOSIS — F329 Major depressive disorder, single episode, unspecified: Secondary | ICD-10-CM | POA: Insufficient documentation

## 2017-02-16 DIAGNOSIS — F209 Schizophrenia, unspecified: Secondary | ICD-10-CM | POA: Diagnosis not present

## 2017-02-16 DIAGNOSIS — Z79899 Other long term (current) drug therapy: Secondary | ICD-10-CM | POA: Insufficient documentation

## 2017-02-16 LAB — RAPID URINE DRUG SCREEN, HOSP PERFORMED
Amphetamines: NOT DETECTED
BARBITURATES: NOT DETECTED
BENZODIAZEPINES: POSITIVE — AB
COCAINE: POSITIVE — AB
Opiates: POSITIVE — AB
TETRAHYDROCANNABINOL: POSITIVE — AB

## 2017-02-16 LAB — COMPREHENSIVE METABOLIC PANEL
ALBUMIN: 3.7 g/dL (ref 3.5–5.0)
ALT: 29 U/L (ref 17–63)
AST: 58 U/L — AB (ref 15–41)
Alkaline Phosphatase: 112 U/L (ref 38–126)
Anion gap: 12 (ref 5–15)
BUN: 5 mg/dL — AB (ref 6–20)
CHLORIDE: 107 mmol/L (ref 101–111)
CO2: 20 mmol/L — AB (ref 22–32)
CREATININE: 0.84 mg/dL (ref 0.61–1.24)
Calcium: 8.7 mg/dL — ABNORMAL LOW (ref 8.9–10.3)
GFR calc Af Amer: >60 mL/min (ref >60)
GFR calc non Af Amer: >60 mL/min (ref >60)
GLUCOSE: 108 mg/dL — AB (ref 65–99)
POTASSIUM: 3.2 mmol/L — AB (ref 3.5–5.1)
SODIUM: 139 mmol/L (ref 135–145)
Total Bilirubin: 0.7 mg/dL (ref 0.3–1.2)
Total Protein: 7 g/dL (ref 6.5–8.1)

## 2017-02-16 LAB — CBC
HEMATOCRIT: 40.6 % (ref 39.0–52.0)
Hemoglobin: 13.6 g/dL (ref 13.0–17.0)
MCH: 29.8 pg (ref 26.0–34.0)
MCHC: 33.5 g/dL (ref 30.0–36.0)
MCV: 88.8 fL (ref 78.0–100.0)
Platelets: 219 10*3/uL (ref 150–400)
RBC: 4.57 MIL/uL (ref 4.22–5.81)
RDW: 14.8 % (ref 11.5–15.5)
WBC: 8.2 10*3/uL (ref 4.0–10.5)

## 2017-02-16 LAB — ETHANOL: ALCOHOL ETHYL (B): 131 mg/dL — AB (ref <10)

## 2017-02-16 NOTE — ED Triage Notes (Signed)
Reports wanting detox from cocaine.  Last use today.  Denies having any complaints.

## 2017-02-17 ENCOUNTER — Other Ambulatory Visit: Payer: Self-pay

## 2017-02-17 ENCOUNTER — Emergency Department (HOSPITAL_COMMUNITY)
Admission: EM | Admit: 2017-02-17 | Discharge: 2017-02-18 | Disposition: A | Discharge status: Home / Self Care | Payer: Medicaid Other | Attending: Emergency Medicine | Admitting: Emergency Medicine

## 2017-02-17 DIAGNOSIS — F329 Major depressive disorder, single episode, unspecified: Secondary | ICD-10-CM

## 2017-02-17 DIAGNOSIS — F32A Depression, unspecified: Secondary | ICD-10-CM

## 2017-02-17 DIAGNOSIS — F1910 Other psychoactive substance abuse, uncomplicated: Secondary | ICD-10-CM

## 2017-02-17 MED ORDER — DIVALPROEX SODIUM 250 MG PO DR TAB: 500.0000 mg | DELAYED_RELEASE_TABLET | ORAL | Status: DC

## 2017-02-17 MED ORDER — NICOTINE 21 MG/24HR TD PT24
21.0000 mg | MEDICATED_PATCH | Freq: Every day | TRANSDERMAL | Status: DC
  Administered 2017-02-17 – 2017-02-18 (×2): 21 mg via TRANSDERMAL
  Filled 2017-02-17 (×2): qty 1

## 2017-02-17 MED ORDER — HYDROXYZINE HCL 25 MG PO TABS: 25.0000 mg | ORAL_TABLET | Freq: Three times a day (TID) | ORAL | Status: DC | PRN

## 2017-02-17 MED ORDER — GABAPENTIN 300 MG PO CAPS
900.0000 mg | ORAL_CAPSULE | Freq: Three times a day (TID) | ORAL | Status: DC
  Administered 2017-02-17 – 2017-02-18 (×4): 900 mg via ORAL
  Filled 2017-02-17 (×4): qty 3

## 2017-02-17 MED ORDER — BACLOFEN 5 MG HALF TABLET: 10.0000 mg | ORAL_TABLET | Freq: Two times a day (BID) | ORAL | Status: DC | PRN

## 2017-02-17 MED ORDER — TRAZODONE HCL 50 MG PO TABS
150.0000 mg | ORAL_TABLET | Freq: Every evening | ORAL | Status: DC | PRN
  Administered 2017-02-17: 22:00:00 150 mg via ORAL
  Filled 2017-02-17: qty 1

## 2017-02-17 MED ORDER — POTASSIUM CHLORIDE CRYS ER 20 MEQ PO TBCR
40.0000 meq | EXTENDED_RELEASE_TABLET | Freq: Once | ORAL | Status: AC
  Administered 2017-02-17: 40 meq via ORAL
  Filled 2017-02-17: qty 2

## 2017-02-17 MED ORDER — VENLAFAXINE HCL ER 75 MG PO CP24
75.0000 mg | ORAL_CAPSULE | Freq: Every day | ORAL | Status: DC
  Administered 2017-02-17 – 2017-02-18 (×2): 75 mg via ORAL
  Filled 2017-02-17 (×2): qty 1

## 2017-02-17 MED ORDER — LURASIDONE HCL 40 MG PO TABS
120.0000 mg | ORAL_TABLET | Freq: Every day | ORAL | Status: DC
  Administered 2017-02-17: 120 mg via ORAL
  Filled 2017-02-17: qty 3

## 2017-02-17 MED ORDER — LURASIDONE HCL 40 MG PO TABS
120.0000 mg | ORAL_TABLET | Freq: Every day | ORAL | Status: DC
  Filled 2017-02-17: qty 3

## 2017-02-17 MED ORDER — DIVALPROEX SODIUM 250 MG PO DR TAB
500.0000 mg | DELAYED_RELEASE_TABLET | Freq: Every evening | ORAL | Status: DC
Start: 2017-02-17 — End: 2017-02-18
  Administered 2017-02-17: 500 mg via ORAL
  Filled 2017-02-17: qty 2

## 2017-02-17 MED ORDER — DIVALPROEX SODIUM 250 MG PO DR TAB
1000.0000 mg | DELAYED_RELEASE_TABLET | Freq: Every morning | ORAL | Status: DC
  Administered 2017-02-17 – 2017-02-18 (×2): 1000 mg via ORAL
  Filled 2017-02-17 (×2): qty 4

## 2017-02-17 NOTE — ED Notes (Signed)
Pt wanded by security. 

## 2017-02-17 NOTE — ED Notes (Signed)
Pt states to this RN that he is SI and has thoughts/plans to run out in traffic; Pt changed to scrubs and belongings inventoried-Monique,RN

## 2017-02-17 NOTE — ED Notes (Signed)
Pt requested something to drink. Ok per [employer] Monique pt requested Ginger Ale pt given the same

## 2017-02-17 NOTE — ED Notes (Signed)
ED Provider at bedside. 

## 2017-02-17 NOTE — ED Notes (Signed)
Pt asking if he will be receiving his afternoon meds - advised pt will be receiving meds tonight. Pt then asking when he will be given his Neurontin. Advised pt 1600. Pt asking for Hydrocodone - voiced understanding - no narcotics to be given, per EDP. Pt appears nad

## 2017-02-17 NOTE — BH Assessment (Signed)
Tele Assessment Note   Patient Name: Danny Lester MRN: [id number] Referring Physician: Dr. Blinda LeatherwoodPollina Location of Patient: [facility] Location of Provider: Behavioral Health TTS Department  Danny Lester is an 42 y.o. male. Pt reports SI with a plan to walk in front of traffic. Pt denies HI and AVH. Pt states he is suicidal due to his substance abuse. 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 Pt is seeking SA treatment. Pt denies previous hospitalizations. Pt denies previous outpatient treatment. Pt reports decreased sleep and appetite. Pt reports homelessness.   Inetta Fermoina, NP recommends overnight observation for safety and stability. Recommends am psych evaluation.   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 Anxiety   . Broken arm   . Depression   . 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 Nerve pain   . PTSD (post-traumatic stress disorder)   . Schizophrenia, paranoid, chronic (HCC)     Past Surgical History:  Procedure Laterality Date  . KNEE SURGERY      Family History: No family history on file.  Social History:  reports that he has been smoking cigarettes.  He has been smoking about 0.30 packs per day. he has never used smokeless tobacco. He reports that he drinks alcohol. He reports that he uses drugs. Drugs: Marijuana and Cocaine.  Additional Social History:  Alcohol / Drug Use Pain Medications: please see mar Prescriptions: please see mar Over the Counter: please see mar History of alcohol / drug use?: Yes Longest period of sobriety (when/how long): unknown Negative Consequences of Use: Financial, Legal, Personal relationships, Work / School Substance #1 Name of Substance 1: cocaine 1 - Age of First Use: unknown 1 - Amount (size/oz): unknown 1 - Frequency: daily 1 - Duration: ongoing 1 - Last Use / Amount: 02/16/17 Substance #2 Name of Substance 2: heroin 2 - Age of First Use: unknown 2 - Amount (size/oz): unknown 2 -  Frequency: daily 2 - Duration: ongoing 2 - Last Use / Amount: 02/16/17  CIWA: CIWA-Ar BP: 124/75 Pulse Rate: (!) 59 COWS: Clinical Opiate Withdrawal Scale (COWS) Resting Pulse Rate: Pulse Rate 80 or below Sweating: Flushed or Observable moistness on face Restlessness: Able to sit still Pupil Size: Pupils pinned or normal size for room light Bone or Joint Aches: Mild diffuse discomfort Runny Nose or Tearing: Not present GI Upset: No GI symptoms Tremor: No tremor Yawning: No yawning Anxiety or Irritability: Patient obviously irritable/anxious Gooseflesh Skin: Skin is smooth COWS Total Score: 5  Allergies:  Allergies  Allergen Reactions  . Penicillins Hives and Swelling    Swelling of face Has patient had a PCN reaction causing immediate rash, facial/tongue/throat swelling, SOB or lightheadedness with hypotension: Yes Has patient had a PCN reaction causing severe rash involving mucus membranes or skin necrosis: Yes Has patient had a PCN reaction that required hospitalization No Has patient had a PCN reaction occurring within the last 10 years: No If all of the above answers are "NO", then may proceed with Cephalosporin use.   Marland Kitchen. Risperdal [Risperidone] Other (See Comments)    Per pt: "starts shaking on one side"    Home Medications:  (Not in a hospital admission)  OB/GYN Status:  No LMP for male patient.  General Assessment Data [facility] Assessment: In system Is this a Tele or Face-to-Face Assessment?: Tele Assessment Is this an Initial Assessment or a Re-assessment for this encounter?: Initial Assessment Marital status: Separated Danny Lester  name: NA Is patient pregnant?: No Pregnancy Status: No Living Arrangements: Other (Comment)(homeless) Can pt return to current living arrangement?: Yes Admission Status: Voluntary Is patient capable of signing voluntary admission?: Yes Referral Source: Self/Family/Friend Insurance type: Medicaid     Crisis  Care Plan Living Arrangements: Other (Comment)(homeless) Legal Guardian: Other:(self) Name of Psychiatrist: NA Name of Therapist: NA  Education Status Is patient currently in school?: No Current Grade: NA Highest grade of school patient has completed: GED Name of school: NA Contact person: NA  Risk to self with the past 6 months Suicidal Ideation: Yes-Currently Present Has patient been a risk to self within the past 6 months prior to admission? : Yes Suicidal Intent: No-Not Currently/Within Last 6 Months Has patient had any suicidal intent within the past 6 months prior to admission? : No Is patient at risk for suicide?: No Suicidal Plan?: Yes-Currently Present Has patient had any suicidal plan within the past 6 months prior to admission? : Yes Specify Current Suicidal Plan: to jump in front of traffic if can't stop using using drugs Access to Means: Yes Specify Access to Suicidal Means: access to traffic What has been your use of drugs/alcohol within the last 12 months?: cocaine, heroin Previous Attempts/Gestures: No How many times?: 0 Other Self Harm Risks: NA Triggers for Past Attempts: Other (Comment)(SA) Intentional Self Injurious Behavior: None Comment - Self Injurious Behavior: NA Family Suicide History: No Recent stressful life event(s): Other (Comment)(SA) Persecutory voices/beliefs?: No Depression: Yes Depression Symptoms: Isolating, Fatigue, Loss of interest in usual pleasures Substance abuse history and/or treatment for substance abuse?: Yes Suicide prevention information given to non-admitted patients: Not applicable  Risk to Others within the past 6 months Homicidal Ideation: No Does patient have any lifetime risk of violence toward others beyond the six months prior to admission? : No Thoughts of Harm to Others: No Comment - Thoughts of Harm to Others: NA Current Homicidal Intent: No Current Homicidal Plan: No Access to Homicidal Means: No Identified  Victim: NA History of harm to others?: No Assessment of Violence: None Noted Violent Behavior Description: NA Does patient have access to weapons?: No Criminal Charges Pending?: No Describe Pending Criminal Charges: NA Does patient have a court date: Yes Court Date: 02/23/17 Is patient on probation?: No  Psychosis Hallucinations: None noted Delusions: None noted  Mental Status Report Appearance/Hygiene: Unremarkable, In scrubs Eye Contact: Fair Motor Activity: Freedom of movement Speech: Logical/coherent Level of Consciousness: Alert Mood: Depressed Affect: Depressed Anxiety Level: Minimal Thought Processes: Coherent, Relevant Judgement: Unimpaired Orientation: Person, Place, Time, Situation Obsessive Compulsive Thoughts/Behaviors: None  Cognitive Functioning Concentration: Normal Memory: Recent Intact, Remote Intact IQ: Average Insight: Fair Impulse Control: Fair Appetite: Fair Weight Loss: 0 Weight Gain: 0 Sleep: Decreased Total Hours of Sleep: 5 Vegetative Symptoms: None  ADLScreening [facility] Assessment Services) Patient's cognitive ability adequate to safely complete daily activities?: Yes Patient able to express need for assistance with ADLs?: Yes Independently performs ADLs?: Yes (appropriate for developmental age)  Prior Inpatient Therapy Prior Inpatient Therapy: No Prior Therapy Dates: N/A Prior Therapy Facilty/Provider(s): N/A Reason for Treatment: N/A  Prior Outpatient Therapy Prior Outpatient Therapy: No Prior Therapy Dates: NA Prior Therapy Facilty/Provider(s): NA Reason for Treatment: Na Does patient have an ACCT team?: No Does patient have Intensive In-House Services?  : No Does patient have [facility] services? : No Does patient have P4CC services?: No  ADL Screening (condition at time of admission) Patient's cognitive ability adequate to safely complete daily activities?: Yes Is the patient  deaf or have difficulty hearing?: No Does the  patient have difficulty seeing, even when wearing glasses/contacts?: No Does the patient have difficulty concentrating, remembering, or making decisions?: No Patient able to express need for assistance with ADLs?: Yes Does the patient have difficulty dressing or bathing?: No Independently performs ADLs?: Yes (appropriate for developmental age) Does the patient have difficulty walking or climbing stairs?: No Weakness of Legs: None Weakness of Arms/Hands: None       Abuse/Neglect Assessment (Assessment to be complete while patient is alone) Abuse/Neglect Assessment Can Be Completed: Yes Physical Abuse: Denies Verbal Abuse: Denies Sexual Abuse: Denies Exploitation of patient/patient's resources: Denies Self-Neglect: Denies     Merchant navy officer (For Healthcare) Does Patient Have a Medical Advance Directive?: No Would patient like information on creating a medical advance directive?: No - Patient declined    Additional Information 1:1 In Past 12 Months?: No CIRT Risk: No Elopement Risk: No Does patient have medical clearance?: Yes     Disposition:  Disposition Initial Assessment Completed for this Encounter: Yes  This service was provided via telemedicine using a 2-way, interactive audio and video technology.  Names of all persons participating in this telemedicine service and their role in this encounter. Name:  Role:   Name Role:   Name:  Role:   Name:  Role:     Danny Lester 02/17/2017 8:05 AM

## 2017-02-17 NOTE — ED Notes (Signed)
Pt remains in waiting room. Updated on wait for treatment room. 

## 2017-02-17 NOTE — ED Notes (Signed)
TTS being performed.  

## 2017-02-17 NOTE — ED Notes (Signed)
Belongings inventoried: 1 pair of jeans, 1 shirt, pair of shoes with socks; 1 black jacket; Valuables with SS: cell phone $9 3 bank cards 1 employee ID card- Placed in locker 6

## 2017-02-17 NOTE — ED Notes (Signed)
Pt arrived to F8 via stretcher - pt ambulated to bed. Pt noted to be wearing burgundy paper scrubs. Belongings inventoried by Pilgrim's PrideMonique, Charity fundraiserN.

## 2017-02-17 NOTE — ED Notes (Signed)
Called Shelter as pt requested [phone] and left message for Darlin DropDebbie Taylor to return cal - so may advise pt is in ED for the night.

## 2017-02-17 NOTE — ED Notes (Signed)
Pt asking if he may have "medicine to help me sleep". Advised pt he may be given med to assist w/this tonight as this type of med is not given during the day. Voiced understanding.

## 2017-02-17 NOTE — ED Notes (Signed)
Danny Lester, Shelter, called back and is aware pt will be in ED overnight.

## 2017-02-17 NOTE — ED Provider Notes (Signed)
[facility] EMERGENCY DEPARTMENT Provider Note   CSN: [id number] Arrival date & time: 02/16/17  2008     History   Chief Complaint Chief Complaint  Patient presents with  . requests detox  . Suicidal    HPI Danny Lester is a 42 y.o. male.  Patient presents to the emergency department for evaluation of drug dependence.  Patient reports that he uses multiple drugs and also drinks alcohol but he has never had withdrawal symptoms from alcohol.  He is feeling very depressed over his drug use.  He feels unsafe but cannot tell me if he is suicidal.      Past Medical History:  Diagnosis Date  . Anxiety   . Broken arm   . Depression   . 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 Nerve pain   . PTSD (post-traumatic stress disorder)   . Schizophrenia, paranoid, chronic [facility])     Patient Active Problem List   Diagnosis Date Noted  . Adjustment disorder with depressed mood 01/28/2017  . Pelvic fracture (HCC) 01/02/2017  . Herpes simplex 02/11/2016  . Leukocytosis 12/01/2015  . Carpal tunnel syndrome, bilateral 09/28/2015  . Plantar fasciitis, bilateral 09/28/2015    Past Surgical History:  Procedure Laterality Date  . KNEE SURGERY         Home Medications    Prior to Admission medications   Medication Sig Start Date End Date Taking? Authorizing Provider  baclofen (LIORESAL) 10 MG tablet 1/2 tab by mouth twice daily only as needed Patient taking differently: Take 10 mg by mouth 2 (two) times daily as needed for muscle spasms (Back spasms).  12/04/16  Yes Julieanne Manson, MD  divalproex (DEPAKOTE) 500 MG DR tablet Take 500-1,000 mg by mouth See admin instructions. Take 2 tablets every morning and take 1 tablet every evening   Yes [provider]  gabapentin (NEURONTIN) 300 MG capsule Take 900 mg by mouth 3 (three) times daily. 02/03/17  Yes [provider]  hydrOXYzine (ATARAX/VISTARIL) 25 MG tablet Take 25 mg by mouth 3 (three) times  daily as needed for anxiety.    Yes [provider]  Lurasidone HCl (LATUDA) 120 MG TABS Take 120 mg by mouth daily.    Yes [provider]  traZODone (DESYREL) 150 MG tablet Take 150 mg by mouth at bedtime as needed for sleep.    Yes [provider]  venlafaxine XR (EFFEXOR-XR) 37.5 MG 24 hr capsule Take 75 mg by mouth daily with breakfast.   Yes [provider]  acyclovir (ZOVIRAX) 400 MG tablet 1 tab by mouth twice daily Patient not taking: Reported on 02/17/2017 02/11/16   Julieanne Manson, MD  gabapentin (NEURONTIN) 600 MG tablet 1 1/2 tabs by mouth 3 times daily. Patient not taking: Reported on 02/17/2017 08/23/16   Julieanne Manson, MD    Family History No family history on file.  Social History Social History   Tobacco Use  . Smoking status: Current Some Day Smoker    Packs/day: 0.30    Types: Cigarettes  . Smokeless tobacco: Never Used  Substance Use Topics  . Alcohol use: Yes    Comment: beer occasionally  . Drug use: Yes    Types: Marijuana, Cocaine    Comment: Pt stated "I have a long hx of drug abuse."     Allergies   Penicillins and Risperdal [risperidone]   Review of Systems Review of Systems  Psychiatric/Behavioral: Positive for dysphoric mood.  All other systems reviewed and are  negative.    Physical Exam Updated Vital Signs BP 124/75 (BP Location: Left Arm)   Pulse (!) 59   Temp 98.4 F (36.9 C) (Oral)   Resp 18   Ht 5\' 7"  (1.702 m)   Wt 73.9 kg (163 lb)   SpO2 98%   BMI 25.53 kg/m   Physical Exam  Constitutional: He is oriented to person, place, and time. He appears well-developed and well-nourished. No distress.  HENT:  Head: Normocephalic and atraumatic.  Right Ear: Hearing normal.  Left Ear: Hearing normal.  Nose: Nose normal.  Mouth/Throat: Oropharynx is clear and moist and mucous membranes are normal.  Eyes: Conjunctivae and EOM are normal. Pupils are equal, round, and reactive to light.  Neck:  Normal range of motion. Neck supple.  Cardiovascular: Regular rhythm, S1 normal and S2 normal. Exam reveals no gallop and no friction rub.  No murmur heard. Pulmonary/Chest: Effort normal and breath sounds normal. No respiratory distress. He exhibits no tenderness.  Abdominal: Soft. Normal appearance and bowel sounds are normal. There is no hepatosplenomegaly. There is no tenderness. There is no rebound, no guarding, no tenderness at McBurney's point and negative Murphy's sign. No hernia.  Musculoskeletal: Normal range of motion.  Neurological: He is alert and oriented to person, place, and time. He has normal strength. No cranial nerve deficit or sensory deficit. Coordination normal. GCS eye subscore is 4. GCS verbal subscore is 5. GCS motor subscore is 6.  Skin: Skin is warm, dry and intact. No rash noted. No cyanosis.  Psychiatric: His mood appears anxious. His speech is delayed. He is slowed and withdrawn. He exhibits a depressed mood (Tearful).  Nursing note and vitals reviewed.    ED Treatments / Results  Labs (all labs ordered are listed, but only abnormal results are displayed) Labs Reviewed  COMPREHENSIVE METABOLIC PANEL - Abnormal; Notable for the following components:      Result Value   Potassium 3.2 (*)    CO2 20 (*)    Glucose, Bld 108 (*)    BUN 5 (*)    Calcium 8.7 (*)    AST 58 (*)    All other components within normal limits  ETHANOL - Abnormal; Notable for the following components:   Alcohol, Ethyl (B) 131 (*)    All other components within normal limits  RAPID URINE DRUG SCREEN, HOSP PERFORMED - Abnormal; Notable for the following components:   Opiates POSITIVE (*)    Cocaine POSITIVE (*)    Benzodiazepines POSITIVE (*)    Tetrahydrocannabinol POSITIVE (*)    All other components within normal limits  CBC    EKG  EKG Interpretation None       Radiology No results found.  Procedures Procedures (including critical care time)  Medications Ordered in  ED Medications - No data to display   Initial Impression / Assessment and Plan / ED Course  I have reviewed the triage vital signs and the nursing notes.  Pertinent labs & imaging results that were available during my care of the patient were reviewed by me and considered in my medical decision making (see chart for details).     Patient presents to the emergency department stating that he has a problem with drug and alcohol abuse.  This has caused him to be depressed.  He wants to be sent for detox.  He was told that we do not have a detox program, but he states that he feels like he needs to be evaluated by mental  health because of his depression.  Will obtain TTS consultation.  Final Clinical Impressions(s) / ED Diagnoses   Final diagnoses:  Depression, unspecified depression type  Polysubstance abuse [facility](HCC)    ED Discharge Orders    None       Gilda CreasePollina, Jesselle Laflamme J, MD 02/17/17 [phone]

## 2017-02-18 DIAGNOSIS — R45851 Suicidal ideations: Secondary | ICD-10-CM

## 2017-02-18 DIAGNOSIS — F17210 Nicotine dependence, cigarettes, uncomplicated: Secondary | ICD-10-CM

## 2017-02-18 DIAGNOSIS — F1190 Opioid use, unspecified, uncomplicated: Secondary | ICD-10-CM | POA: Diagnosis not present

## 2017-02-18 DIAGNOSIS — F1490 Cocaine use, unspecified, uncomplicated: Secondary | ICD-10-CM | POA: Diagnosis not present

## 2017-02-18 NOTE — ED Notes (Signed)
Pt asking "where's my other medicines". Advised pt will be given at 1000. Voiced understanding.

## 2017-02-18 NOTE — ED Notes (Signed)
Pt stated he is homeless and is staying at [facility]. States is able to stay there until 04/09/17. States they will then assist him w/first month's rent in an apartment.

## 2017-02-18 NOTE — Discharge Instructions (Signed)
Return to the ED with any concerns including thoughts or feelings of suicide or homicide or any other alarming symptoms °

## 2017-02-18 NOTE — ED Notes (Signed)
Telepsych being performed. 

## 2017-02-18 NOTE — ED Notes (Addendum)
Signature pad not working. Outpt resources including [facility] and [facility] - discussed and given to pt - Pt voiced understanding to follow up. Bus pass given.

## 2017-02-18 NOTE — Consult Note (Signed)
Telepsych Consultation   Reason for Consult: Substance abuse and suicide ideations Referring Physician: Dr. Betsey Holiday Location of Patient: [facility] Location of Provider: [facility]  Patient Identification: Danny Lester MRN:  [id number] Principal Diagnosis: <principal problem not specified> Diagnosis:   Patient Active Problem List   Diagnosis Date Noted  . Adjustment disorder with depressed mood [F43.21] 01/28/2017  . Pelvic fracture (HCC) [S32.9XXA] 01/02/2017  . Herpes simplex [B00.9] 02/11/2016  . Leukocytosis [D72.829] 12/01/2015  . Carpal tunnel syndrome, bilateral [G56.03] 09/28/2015  . Plantar fasciitis, bilateral [M72.2] 09/28/2015    Total Time spent with patient: 30 minutes  Subjective:   Danny Lester is a 42 y.o. male patient admitted with Cocaine.  HPI: Per the TTS assessment completed on 02/17/17 by Lorenza Cambridge: Danny Lester is an 42 y.o. male. Pt reports SI with a plan to walk in front of traffic. Pt denies HI and AVH. Pt states he is suicidal due to his substance abuse. 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 Pt is seeking SA treatment. Pt denies previous hospitalizations. Pt denies previous outpatient treatment. Pt reports decreased sleep and appetite. Pt reports homelessness.  On Exam: Patient was seen via tele-psych, chart reviewed with treatment team. Patient in bed, awake, alert and oriented x4. Patient reiterated the reason for this hospital admission as documented above. Patient stated, "I came to the ED because I want detox and to get help with my mental health issues". Patient stated that he relapsed from cocaine and I felt suicidal. Patient stated that his trigger was his ex-wife getting a hold of his bank card and she activated the card and his money is gone. Patient stated that he does not have access to money until next month. Patient stated that he is still having some suicide ideations with plan to jump in front of traffic. However, patient stated  that he has protective factors and reasons why he wants to be alive. Patient stated that he wants to be here for his 4 children, his mother and his 2 brothers. Patient stated that he is trying to get clean from drug use. That he was sober for 2 months prior to recently relapsing. Patient stating that he is committing togetting help with detox. He agrees to follow-up as recommended to [facility] treatment facility whenever discharged. Patient currently reporting being homeless due to recent separation from his wife. He is currently staying in a shelter home and he is able to return to the shelter home whenever discharged. Patient's reports chronic auditory hallucination that has been ongoing but denies any at the moment. Patient denies any current visual hallucination and also denies any homicidal ideations. Patient's aware that his drug test was positive for benzo, opiates, cocaine, THC with alcohol level of 131, he agrees to refrain from drug use. During this encounter, patient does not appear to be responding to internal stimuli. Patient contracts for safety.  Past Psychiatric History: As in H&P  Risk to Self: Suicidal Ideation: Yes-Currently Present Suicidal Intent: No-Not Currently/Within Last 6 Months Is patient at risk for suicide?: No Suicidal Plan?: Yes-Currently Present Specify Current Suicidal Plan: to jump in front of traffic if can't stop using using drugs Access to Means: Yes Specify Access to Suicidal Means: access to traffic What has been your use of drugs/alcohol within the last 12 months?: cocaine, heroin How many times?: 0 Other Self Harm Risks: NA Triggers for Past Attempts: Other (Comment)(SA) Intentional Self Injurious Behavior: None Comment - Self Injurious Behavior: NA Risk to Others: Homicidal  Ideation: No Thoughts of Harm to Others: No Comment - Thoughts of Harm to Others: NA Current Homicidal Intent: No Current Homicidal Plan: No Access to Homicidal Means: No Identified  Victim: NA History of harm to others?: No Assessment of Violence: None Noted Violent Behavior Description: NA Does patient have access to weapons?: No Criminal Charges Pending?: No Describe Pending Criminal Charges: NA Does patient have a court date: Yes Court Date: 02/23/17 Prior Inpatient Therapy: Prior Inpatient Therapy: No Prior Therapy Dates: N/A Prior Therapy Facilty/Provider(s): N/A Reason for Treatment: N/A Prior Outpatient Therapy: Prior Outpatient Therapy: No Prior Therapy Dates: NA Prior Therapy Facilty/Provider(s): NA Reason for Treatment: Na Does patient have an ACCT team?: No Does patient have Intensive In-House Services?  : No Does patient have [facility] services? : No Does patient have P4CC services?: No  Past Medical History:  Past Medical History:  Diagnosis Date  . Anxiety   . Broken arm   . Depression   . Multiple personality disorder ([place])   . Nerve pain   . PTSD (post-traumatic stress disorder)   . Schizophrenia, paranoid, chronic ([place])     Past Surgical History:  Procedure Laterality Date  . KNEE SURGERY     Family History: No family history on file. Family Psychiatric  History: Unknown Social History:  Social History   Substance and Sexual Activity  Alcohol Use Yes   Comment: beer occasionally     Social History   Substance and Sexual Activity  Drug Use Yes  . Types: Marijuana, Cocaine   Comment: Pt stated "I have a long hx of drug abuse."    Social History   Socioeconomic History  . Marital status: Single    Spouse name: None  . Number of children: None  . Years of education: None  . Highest education level: None  Social Needs  . Financial resource strain: None  . Food insecurity - worry: None  . Food insecurity - inability: None  . Transportation needs - medical: None  . Transportation needs - non-medical: None  Occupational History  . None  Tobacco Use  . Smoking status: Current Some Day Smoker    Packs/day: 0.30     Types: Cigarettes  . Smokeless tobacco: Never Used  Substance and Sexual Activity  . Alcohol use: Yes    Comment: beer occasionally  . Drug use: Yes    Types: Marijuana, Cocaine    Comment: Pt stated "I have a long hx of drug abuse."  . Sexual activity: Yes    Birth control/protection: None  Other Topics Concern  . None  Social History Narrative  . None   Additional Social History:    Allergies:   Allergies  Allergen Reactions  . Penicillins Hives and Swelling    Swelling of face Has patient had a PCN reaction causing immediate rash, facial/tongue/throat swelling, SOB or lightheadedness with hypotension: Yes Has patient had a PCN reaction causing severe rash involving mucus membranes or skin necrosis: Yes Has patient had a PCN reaction that required hospitalization No Has patient had a PCN reaction occurring within the last 10 years: No If all of the above answers are "NO", then may proceed with Cephalosporin use.   Marland Kitchen Risperdal [Risperidone] Other (See Comments)    Per pt: "starts shaking on one side"    Labs:  Results for orders placed or performed during the hospital encounter of 02/17/17 (from the past 48 hour(s))  Comprehensive metabolic panel     Status: Abnormal   Collection  Time: 02/16/17  8:21 PM  Result Value Ref Range   Sodium 139 135 - 145 mmol/L   Potassium 3.2 (L) 3.5 - 5.1 mmol/L   Chloride 107 101 - 111 mmol/L   CO2 20 (L) 22 - 32 mmol/L   Glucose, Bld 108 (H) 65 - 99 mg/dL   BUN 5 (L) 6 - 20 mg/dL   Creatinine, Ser 0.84 0.61 - 1.24 mg/dL   Calcium 8.7 (L) 8.9 - 10.3 mg/dL   Total Protein 7.0 6.5 - 8.1 g/dL   Albumin 3.7 3.5 - 5.0 g/dL   AST 58 (H) 15 - 41 U/L   ALT 29 17 - 63 U/L   Alkaline Phosphatase 112 38 - 126 U/L   Total Bilirubin 0.7 0.3 - 1.2 mg/dL   GFR calc non Af Amer >60 >60 mL/min   GFR calc Af Amer >60 >60 mL/min    Comment: (NOTE) The eGFR has been calculated using the CKD EPI equation. This calculation has not been validated in  all clinical situations. eGFR's persistently <60 mL/min signify possible Chronic Kidney Disease.    Anion gap 12 5 - 15    Comment: Performed at [facility] [address] [place], [place] [address]  Ethanol     Status: Abnormal   Collection Time: 02/16/17  8:21 PM  Result Value Ref Range   Alcohol, Ethyl (B) 131 (H) <10 mg/dL    Comment:        LOWEST DETECTABLE LIMIT FOR SERUM ALCOHOL IS 10 mg/dL FOR MEDICAL PURPOSES ONLY Performed at [facility], [place] [address] [place], [place] [address]   cbc     Status: None   Collection Time: 02/16/17  8:21 PM  Result Value Ref Range   WBC 8.2 4.0 - 10.5 K/uL   RBC 4.57 4.22 - 5.81 MIL/uL   Hemoglobin 13.6 13.0 - 17.0 g/dL   HCT 40.6 39.0 - 52.0 %   MCV 88.8 78.0 - 100.0 fL   MCH 29.8 26.0 - 34.0 pg   MCHC 33.5 30.0 - 36.0 g/dL   RDW 14.8 11.5 - 15.5 %   Platelets 219 150 - 400 K/uL    Comment: Performed at [facility], [place] [address] [place], [place] [address]  Rapid urine drug screen (hospital performed)     Status: Abnormal   Collection Time: 02/16/17  8:25 PM  Result Value Ref Range   Opiates POSITIVE (A) NONE DETECTED   Cocaine POSITIVE (A) NONE DETECTED   Benzodiazepines POSITIVE (A) NONE DETECTED   Amphetamines NONE DETECTED NONE DETECTED   Tetrahydrocannabinol POSITIVE (A) NONE DETECTED   Barbiturates NONE DETECTED NONE DETECTED    Comment: (NOTE) DRUG SCREEN FOR MEDICAL PURPOSES ONLY.  IF CONFIRMATION IS NEEDED FOR ANY PURPOSE, NOTIFY LAB WITHIN 5 DAYS. LOWEST DETECTABLE LIMITS FOR URINE DRUG SCREEN Drug Class                     Cutoff (ng/mL) Amphetamine and metabolites    1000 Barbiturate and metabolites    200 Benzodiazepine                 620 Tricyclics and metabolites     300 Opiates and metabolites        300 Cocaine and metabolites        300 THC  50 Performed at [facility], [place] [address], [place], [place] [address]     Medications:  Current  Facility-Administered Medications  Medication Dose Route Frequency Provider Last Rate Last Dose  . baclofen (LIORESAL) tablet 10 mg  10 mg Oral BID PRN Long, Wonda Olds, MD      . divalproex (DEPAKOTE) DR tablet 1,000 mg  1,000 mg Oral q morning - 10a Jalene Mullet, RPH   1,000 mg at 02/17/17 1338   And  . divalproex (DEPAKOTE) DR tablet 500 mg  500 mg Oral QPM Jalene Mullet, RPH   500 mg at 02/17/17 2154  . gabapentin (NEURONTIN) capsule 900 mg  900 mg Oral TID Margette Fast, MD   900 mg at 02/17/17 2155  . hydrOXYzine (ATARAX/VISTARIL) tablet 25 mg  25 mg Oral TID PRN Margette Fast, MD      . lurasidone (LATUDA) tablet 120 mg  120 mg Oral Daily Jalene Mullet, RPH   120 mg at 02/17/17 2154  . nicotine (NICODERM CQ - dosed in mg/24 hours) patch 21 mg  21 mg Transdermal Daily Pollina, Gwenyth Allegra, MD   21 mg at 02/17/17 1009  . traZODone (DESYREL) tablet 150 mg  150 mg Oral QHS PRN Long, Wonda Olds, MD   150 mg at 02/17/17 2158  . venlafaxine XR (EFFEXOR-XR) 24 hr capsule 75 mg  75 mg Oral Q breakfast Long, Wonda Olds, MD   75 mg at 02/18/17 5638   Current Outpatient Medications  Medication Sig Dispense Refill  . acyclovir (ZOVIRAX) 400 MG tablet 1 tab by mouth twice daily 60 tablet 11  . baclofen (LIORESAL) 10 MG tablet 1/2 tab by mouth twice daily only as needed (Patient taking differently: Take 10 mg by mouth 2 (two) times daily as needed for muscle spasms (Back spasms). ) 30 each 3  . divalproex (DEPAKOTE) 500 MG DR tablet Take 500-1,000 mg by mouth See admin instructions. Take 2 tablets every morning and take 1 tablet every evening    . gabapentin (NEURONTIN) 300 MG capsule Take 900 mg by mouth 3 (three) times daily.  0  . hydrOXYzine (ATARAX/VISTARIL) 25 MG tablet Take 25 mg by mouth 3 (three) times daily as needed for anxiety.     . Lurasidone HCl (LATUDA) 120 MG TABS Take 120 mg by mouth daily.     . traZODone (DESYREL) 150 MG tablet Take 150 mg by mouth at bedtime as needed for  sleep.     Marland Kitchen venlafaxine XR (EFFEXOR-XR) 37.5 MG 24 hr capsule Take 75 mg by mouth daily with breakfast.    . gabapentin (NEURONTIN) 600 MG tablet 1 1/2 tabs by mouth 3 times daily. (Patient not taking: Reported on 02/17/2017) 135 tablet 11    Musculoskeletal: UTA via camera  Psychiatric Specialty Exam: Physical Exam  Nursing note and vitals reviewed.   Review of Systems  Psychiatric/Behavioral: Positive for depression, hallucinations, substance abuse and suicidal ideas. Negative for memory loss. The patient is not nervous/anxious and does not have insomnia.   All other systems reviewed and are negative.   Blood pressure (!) 102/52, pulse 83, temperature 98.3 F (36.8 C), temperature source Oral, resp. rate 16, height _0  (1.702 m), weight 73.9 kg (163 lb), SpO2 99 %.Body mass index is 25.53 kg/m.  General Appearance: on hospital scrub  Eye Contact:  Minimal  Speech:  Clear and Coherent and Normal Rate  Volume:  Normal  Mood:  Anxious, Depressed and Hopeless  Affect:  Blunt and Congruent  Thought Process:  Coherent and Goal Directed  Orientation:  Full (Time, Place, and Person)  Thought Content:  WDL and Logical  Suicidal Thoughts:  Chronic passive SI with plan to jump infront of a traffic   Homicidal Thoughts:  No  Memory:  Immediate;   Good Recent;   Good Remote;   Fair  Judgement:  Intact  Insight:  Present  Psychomotor Activity:  Normal  Concentration:  Concentration: Good and Attention Span: Good  Recall:  Good  Fund of Knowledge:  Good  Language:  Good  Akathisia:  Negative  Handed:  Right  AIMS (if indicated):     Assets:  Communication Skills Desire for Improvement Leisure Time Social Support  ADL's:  Intact  Cognition:  WNL  Sleep:        Treatment Plan Summary: Plan to discharge with substance abuse treatment facilities resources SW to provide patient a bus pass to return to the shelter home. Continue your current medications without change and take  them regularly  Continue your OP follow up with [facility] Follow up with substance abuse treatment facilities for detox Follow up with your therapist for counseling Avoid the use of drugs and/or alcohol Follow up with your provider or return to the ED with any new or worsening depressive symptoms  Disposition: No evidence of imminent risk to self or others at present.   Patient does not meet criteria for psychiatric inpatient admission. Supportive therapy provided about ongoing stressors. Refer to IOP. Discussed crisis plan, support from social network, calling 911, coming to the Emergency Department, and calling Suicide Hotline.  This service was provided via telemedicine using a 2-way, interactive audio and video technology.  Names of all persons participating in this telemedicine service and their role in this encounter. Name: Danny Lester Role: Patient  Name: Addisyn Leclaire A. Jeral Zick  Role: NP           Vicenta Aly, NP 02/18/2017 10:12 AM

## 2017-02-18 NOTE — Progress Notes (Signed)
LCSW following for disposition: Patient has been psychiatrically cleared for discharge. LCSW will placed resources for SA on his AVS for him to follow up. NP reports patient will need a bus pass for transport and LCSW will as RN in ED to assist.  No other needs Plan: DC today, home.  Deretha EmoryHannah Shizuko Wojdyla LCSW, MSW Clinical Social Work: Optician, dispensingystem Wide Float Coverage for :  [phone]

## 2017-02-21 NOTE — Telephone Encounter (Signed)
Also, need to see whether [facility] if [facility] provider

## 2017-02-21 NOTE — Telephone Encounter (Signed)
He needs to come in and be seen.  He was in the hospital again and looks like using many drugs.  Concerned to give him something that could interact.

## 2017-02-23 NOTE — Telephone Encounter (Signed)
Called patient 3 times. No answer and voicemail box is full.

## 2017-03-06 ENCOUNTER — Ambulatory Visit: Payer: Medicaid Other | Admitting: Internal Medicine

## 2017-03-07 ENCOUNTER — Emergency Department (HOSPITAL_COMMUNITY)
Admission: EM | Admit: 2017-03-07 | Discharge: 2017-03-07 | Disposition: A | Discharge status: Home / Self Care | Payer: Medicaid Other | Attending: Emergency Medicine | Admitting: Emergency Medicine

## 2017-03-07 ENCOUNTER — Other Ambulatory Visit: Payer: Self-pay

## 2017-03-07 ENCOUNTER — Encounter (HOSPITAL_COMMUNITY): Payer: Self-pay | Admitting: *Deleted

## 2017-03-07 DIAGNOSIS — F32A Depression, unspecified: Secondary | ICD-10-CM

## 2017-03-07 DIAGNOSIS — R45851 Suicidal ideations: Secondary | ICD-10-CM | POA: Diagnosis not present

## 2017-03-07 DIAGNOSIS — F329 Major depressive disorder, single episode, unspecified: Secondary | ICD-10-CM | POA: Insufficient documentation

## 2017-03-07 DIAGNOSIS — F4321 Adjustment disorder with depressed mood: Secondary | ICD-10-CM

## 2017-03-07 DIAGNOSIS — F17210 Nicotine dependence, cigarettes, uncomplicated: Secondary | ICD-10-CM | POA: Diagnosis not present

## 2017-03-07 DIAGNOSIS — R4587 Impulsiveness: Secondary | ICD-10-CM

## 2017-03-07 DIAGNOSIS — Z79899 Other long term (current) drug therapy: Secondary | ICD-10-CM | POA: Insufficient documentation

## 2017-03-07 DIAGNOSIS — Z560 Unemployment, unspecified: Secondary | ICD-10-CM

## 2017-03-07 LAB — CBC
HEMATOCRIT: 42.9 % (ref 39.0–52.0)
HEMOGLOBIN: 14.4 g/dL (ref 13.0–17.0)
MCH: 30 pg (ref 26.0–34.0)
MCHC: 33.6 g/dL (ref 30.0–36.0)
MCV: 89.4 fL (ref 78.0–100.0)
Platelets: 259 10*3/uL (ref 150–400)
RBC: 4.8 MIL/uL (ref 4.22–5.81)
RDW: 15.1 % (ref 11.5–15.5)
WBC: 9.5 10*3/uL (ref 4.0–10.5)

## 2017-03-07 LAB — COMPREHENSIVE METABOLIC PANEL
ALK PHOS: 91 U/L (ref 38–126)
ALT: 24 U/L (ref 17–63)
AST: 22 U/L (ref 15–41)
Albumin: 4.1 g/dL (ref 3.5–5.0)
Anion gap: 11 (ref 5–15)
BUN: 14 mg/dL (ref 6–20)
CALCIUM: 9.2 mg/dL (ref 8.9–10.3)
CO2: 24 mmol/L (ref 22–32)
CREATININE: 0.83 mg/dL (ref 0.61–1.24)
Chloride: 106 mmol/L (ref 101–111)
GFR calc non Af Amer: >60 mL/min (ref >60)
GLUCOSE: 96 mg/dL (ref 65–99)
Potassium: 3.8 mmol/L (ref 3.5–5.1)
Sodium: 141 mmol/L (ref 135–145)
Total Bilirubin: 1.2 mg/dL (ref 0.3–1.2)
Total Protein: 7.5 g/dL (ref 6.5–8.1)

## 2017-03-07 LAB — SALICYLATE LEVEL

## 2017-03-07 LAB — ETHANOL: Alcohol, Ethyl (B): 42 mg/dL — ABNORMAL HIGH (ref <10)

## 2017-03-07 LAB — ACETAMINOPHEN LEVEL: Acetaminophen (Tylenol), Serum: <10 ug/mL — ABNORMAL LOW (ref 10–30)

## 2017-03-07 MED ORDER — ACETAMINOPHEN 500 MG PO TABS
1000.0000 mg | ORAL_TABLET | Freq: Once | ORAL | Status: AC
  Administered 2017-03-07: 1000 mg via ORAL
  Filled 2017-03-07: qty 2

## 2017-03-07 NOTE — [facility] Suicide Risk Assessment (Cosign Needed)
Suicide Risk Assessment  Discharge Assessment   [facility]   Principal Problem: Adjustment disorder with depressed mood Discharge Diagnoses:  Patient Active Problem List   Diagnosis Date Noted  . Adjustment disorder with depressed mood [F43.21] 01/28/2017  . Pelvic fracture (HCC) [S32.9XXA] 01/02/2017  . Herpes simplex [B00.9] 02/11/2016  . Leukocytosis [D72.829] 12/01/2015  . Carpal tunnel syndrome, bilateral [G56.03] 09/28/2015  . Plantar fasciitis, bilateral [M72.2] 09/28/2015   Pt was seen and chart reviewed with treatment team and Dr Sharma [name].  Pt denies suicidal/homicidal ideation, denies auditory/visual hallucinations and does not appear to be responding to internal stimuli. Pt refused to give a urine sample but passed the nurse some juice in the sample jar. Pt has been in the ED 10 times in the past 6 months. Pt's BAL was 42. Pt is frequently positive for multiple drugs when he presents to the emergency room. [facility] and also [facility]. Pt is psychiatrically clear for discharge.    Total Time spent with patient: 45 minutes  Musculoskeletal: Strength & Muscle Tone: within normal limits Gait & Station: normal Patient leans: N/A  Psychiatric Specialty Exam:   Blood pressure 115/79, pulse 67, temperature 98 F (36.7 C), temperature source Oral, resp. rate 16, height 5\' 7"  (1.702 m), weight 73.9 kg (163 lb), SpO2 100 %.Body mass index is 25.53 kg/m.  General Appearance: Casual  Eye Contact::  Good  Speech:  Clear and Coherent409  Volume:  Normal  Mood:  Depressed  Affect:  Congruent and Depressed  Thought Process:  Coherent, Goal Directed and Linear  Orientation:  Full (Time, Place, and Person)  Thought Content:  Logical  Suicidal Thoughts:  No  Homicidal Thoughts:  No  Memory:  Immediate;   Good Recent;   Good Remote;   Fair  Judgement:  Poor   Insight:  Lacking  Psychomotor Activity:  Normal  Concentration:  Good  Recall:  Good  Fund of Knowledge:Good  Language: Good  Akathisia:  No  Handed:  Right  AIMS (if indicated):     Assets:  ArchitectCommunication Skills Financial Resources/Insurance Housing  Sleep:     Cognition: WNL  ADL's:  Intact   Mental Status Per Nursing Assessment::   On Admission:   Suicidal thoughts  Demographic Factors:  Male, Low socioeconomic status and Unemployed  Loss Factors: Financial problems/change in socioeconomic status  Historical Factors: Impulsivity  Risk Reduction Factors:   Sense of responsibility to family  Continued Clinical Symptoms:  Depression:   Impulsivity Alcohol/Substance Abuse/Dependencies  Cognitive Features That Contribute To Risk:  Closed-mindedness    Suicide Risk:  Minimal: No identifiable suicidal ideation.  Patients presenting with no risk factors but with morbid ruminations; may be classified as minimal risk based on the severity of the depressive symptoms    Plan Of Care/Follow-up recommendations:  Activity:  as tolerated Diet:  Heart healthy  Laveda AbbeLaurie Britton Parks, NP 03/07/2017, 10:02 AM

## 2017-03-07 NOTE — ED Notes (Signed)
Up to the bathroom 
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
## 2017-03-07 NOTE — ED Notes (Signed)
Pt ambulatory w/o difficulty to dc are a w/ mHt, belongings returned after leaving the area. 

## 2017-03-07 NOTE — ED Notes (Signed)
Written dc instructions reviewed with patient. Pt encouraged to take his medications as directed and seek treatment for any changes in si/hi thoughts or urges.  Pt verbalized understanding and reported that he would do so.   Pt reports that he is going to [facility] today for follow up and will contact his PMD concerning follow up about his back. Pt also encouraged to resume participating in his NA and other group.  Bus pass given.

## 2017-03-07 NOTE — ED Notes (Signed)
Dr Sharma [name] and [name] NP into see.  Pt laying on the bed,  speaking very quietly.  Pt reports that he is still having suicidal thoughts and homicidal thoughts "somewhat."  Pt reports that these are not directed at anyone in particular. Pt denies AVH, and reports that he has been off of his meds for 2-3 days. Urine sample requested again.   Pt reports that "things have been rough...want to get ride of these thoughts.Danny Lester.Danny Lester."

## 2017-03-07 NOTE — BH Assessment (Signed)
[facility]  Per Danny BeetsJacqueline Norman, DO, this pt does not require psychiatric hospitalization at this time.  [facility]  This has been included in pt's discharge instructions.  Pt's nurse, Wille CelesteJanie, has been notified.  Danny Canninghomas Terron Merfeld, MA Triage Specialist [phone]
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
## 2017-03-07 NOTE — ED Notes (Addendum)
Up walking in the hall.  Pt angry that he has not received his medications yet and that the psychiatrist can not order his medication.  Pt is aware that the psychiatrist can order his medications.  Pt asked to collect another urine sample.  Pt provided specimen that was cold and very cloudy and strong odor of fruit juice.

## 2017-03-07 NOTE — ED Triage Notes (Signed)
Pt brought in by [employer]. Tired of life, wants to walk into traffic or in front of a train.

## 2017-03-07 NOTE — ED Notes (Signed)
Bed: [facility] Expected date:  Expected time:  Means of arrival:  Comments: 

## 2017-03-07 NOTE — Discharge Instructions (Signed)
For your mental health needs, you are advised to continue treatment with [facility]: ° °     [facility] °     [address] °     [place], [place] [address] °     [phone]

## 2017-03-07 NOTE — ED Notes (Signed)
Pt A&O x 3, no distress, presents with SI, doesn't want to live anymore.  HI, pt reports towards a couple of people but not specific.  Feeling hopeless.  Monitoring for safety, Q 15 min checks in effect.

## 2017-03-07 NOTE — ED Notes (Signed)
Pt attempted to give urine sample, but was unable to. Pt will let us know when he is able to give one.

## 2017-03-07 NOTE — ED Notes (Signed)
Up in the hall talking with dr Sharma [name]

## 2017-03-11 ENCOUNTER — Emergency Department (HOSPITAL_COMMUNITY)
Admission: EM | Admit: 2017-03-11 | Discharge: 2017-03-12 | Disposition: A | Discharge status: Home / Self Care | Payer: Medicaid Other | Attending: Emergency Medicine | Admitting: Emergency Medicine

## 2017-03-11 ENCOUNTER — Other Ambulatory Visit: Payer: Self-pay

## 2017-03-11 ENCOUNTER — Encounter (HOSPITAL_COMMUNITY): Payer: Self-pay | Admitting: Emergency Medicine

## 2017-03-11 DIAGNOSIS — R45851 Suicidal ideations: Secondary | ICD-10-CM | POA: Diagnosis not present

## 2017-03-11 DIAGNOSIS — F333 Major depressive disorder, recurrent, severe with psychotic symptoms: Secondary | ICD-10-CM | POA: Diagnosis not present

## 2017-03-11 DIAGNOSIS — F1410 Cocaine abuse, uncomplicated: Secondary | ICD-10-CM | POA: Diagnosis not present

## 2017-03-11 DIAGNOSIS — F17210 Nicotine dependence, cigarettes, uncomplicated: Secondary | ICD-10-CM | POA: Diagnosis not present

## 2017-03-11 DIAGNOSIS — Z79899 Other long term (current) drug therapy: Secondary | ICD-10-CM | POA: Insufficient documentation

## 2017-03-11 DIAGNOSIS — F329 Major depressive disorder, single episode, unspecified: Secondary | ICD-10-CM | POA: Diagnosis present

## 2017-03-11 LAB — COMPREHENSIVE METABOLIC PANEL
ALT: 18 U/L (ref 17–63)
AST: 24 U/L (ref 15–41)
Albumin: 4.1 g/dL (ref 3.5–5.0)
Alkaline Phosphatase: 100 U/L (ref 38–126)
Anion gap: 13 (ref 5–15)
BUN: 8 mg/dL (ref 6–20)
CO2: 21 mmol/L — ABNORMAL LOW (ref 22–32)
Calcium: 9.5 mg/dL (ref 8.9–10.3)
Chloride: 100 mmol/L — ABNORMAL LOW (ref 101–111)
Creatinine, Ser: 0.97 mg/dL (ref 0.61–1.24)
GFR calc Af Amer: >60 mL/min (ref >60)
GFR calc non Af Amer: >60 mL/min (ref >60)
Glucose, Bld: 98 mg/dL (ref 65–99)
Potassium: 3.8 mmol/L (ref 3.5–5.1)
Sodium: 134 mmol/L — ABNORMAL LOW (ref 135–145)
Total Bilirubin: 0.9 mg/dL (ref 0.3–1.2)
Total Protein: 7.7 g/dL (ref 6.5–8.1)

## 2017-03-11 LAB — RAPID URINE DRUG SCREEN, HOSP PERFORMED
Amphetamines: NOT DETECTED
Barbiturates: NOT DETECTED
Benzodiazepines: NOT DETECTED
Cocaine: POSITIVE — AB
Opiates: NOT DETECTED
Tetrahydrocannabinol: POSITIVE — AB

## 2017-03-11 LAB — CBC
HCT: 45 % (ref 39.0–52.0)
Hemoglobin: 15.7 g/dL (ref 13.0–17.0)
MCH: 30.8 pg (ref 26.0–34.0)
MCHC: 34.9 g/dL (ref 30.0–36.0)
MCV: 88.4 fL (ref 78.0–100.0)
Platelets: 203 10*3/uL (ref 150–400)
RBC: 5.09 MIL/uL (ref 4.22–5.81)
RDW: 14.8 % (ref 11.5–15.5)
WBC: 10.1 10*3/uL (ref 4.0–10.5)

## 2017-03-11 LAB — SALICYLATE LEVEL: Salicylate Lvl: <7 mg/dL (ref 2.8–30.0)

## 2017-03-11 LAB — ETHANOL: Alcohol, Ethyl (B): <10 mg/dL (ref <10)

## 2017-03-11 LAB — ACETAMINOPHEN LEVEL: Acetaminophen (Tylenol), Serum: <10 ug/mL — ABNORMAL LOW (ref 10–30)

## 2017-03-11 MED ORDER — DIVALPROEX SODIUM 250 MG PO DR TAB: 500.0000 mg | DELAYED_RELEASE_TABLET | ORAL | Status: DC

## 2017-03-11 MED ORDER — DIVALPROEX SODIUM 250 MG PO DR TAB
1000.0000 mg | DELAYED_RELEASE_TABLET | Freq: Every day | ORAL | Status: DC
  Administered 2017-03-12: 1000 mg via ORAL
  Filled 2017-03-11: qty 4

## 2017-03-11 MED ORDER — ACYCLOVIR 400 MG PO TABS
400.0000 mg | ORAL_TABLET | Freq: Two times a day (BID) | ORAL | Status: DC
  Administered 2017-03-11 – 2017-03-12 (×2): 400 mg via ORAL
  Filled 2017-03-11 (×2): qty 1

## 2017-03-11 MED ORDER — NICOTINE 21 MG/24HR TD PT24
21.0000 mg | MEDICATED_PATCH | Freq: Every day | TRANSDERMAL | Status: DC
  Administered 2017-03-11: 21 mg via TRANSDERMAL
  Filled 2017-03-11: qty 1

## 2017-03-11 MED ORDER — ALUM & MAG HYDROXIDE-SIMETH 200-200-20 MG/5ML PO SUSP: 30.0000 mL | Freq: Four times a day (QID) | ORAL | Status: DC | PRN

## 2017-03-11 MED ORDER — LURASIDONE HCL 40 MG PO TABS
120.0000 mg | ORAL_TABLET | Freq: Every day | ORAL | Status: DC
  Administered 2017-03-11: 120 mg via ORAL
  Filled 2017-03-11: qty 2
  Filled 2017-03-11 (×2): qty 3

## 2017-03-11 MED ORDER — ACETAMINOPHEN 325 MG PO TABS
650.0000 mg | ORAL_TABLET | ORAL | Status: DC | PRN
  Administered 2017-03-11: 650 mg via ORAL
  Filled 2017-03-11: qty 2

## 2017-03-11 MED ORDER — GABAPENTIN 300 MG PO CAPS
900.0000 mg | ORAL_CAPSULE | Freq: Three times a day (TID) | ORAL | Status: DC
  Administered 2017-03-11 – 2017-03-12 (×3): 900 mg via ORAL
  Filled 2017-03-11 (×3): qty 3

## 2017-03-11 MED ORDER — ONDANSETRON HCL 4 MG PO TABS: 4.0000 mg | ORAL_TABLET | Freq: Three times a day (TID) | ORAL | Status: DC | PRN

## 2017-03-11 MED ORDER — ZOLPIDEM TARTRATE 5 MG PO TABS
5.0000 mg | ORAL_TABLET | Freq: Every evening | ORAL | Status: DC | PRN
Start: 2017-03-11 — End: 2017-03-12
  Administered 2017-03-11: 5 mg via ORAL
  Filled 2017-03-11: qty 1

## 2017-03-11 MED ORDER — HYDROXYZINE HCL 25 MG PO TABS
25.0000 mg | ORAL_TABLET | Freq: Three times a day (TID) | ORAL | Status: DC | PRN
  Administered 2017-03-11: 25 mg via ORAL
  Filled 2017-03-11: qty 1

## 2017-03-11 MED ORDER — GABAPENTIN 600 MG PO TABS
900.0000 mg | ORAL_TABLET | Freq: Three times a day (TID) | ORAL | Status: DC
  Filled 2017-03-11: qty 1.5

## 2017-03-11 MED ORDER — TRAZODONE HCL 50 MG PO TABS
150.0000 mg | ORAL_TABLET | Freq: Every evening | ORAL | Status: DC | PRN
  Administered 2017-03-11: 22:00:00 150 mg via ORAL
  Filled 2017-03-11: qty 1

## 2017-03-11 MED ORDER — DIVALPROEX SODIUM 250 MG PO DR TAB
500.0000 mg | DELAYED_RELEASE_TABLET | Freq: Every day | ORAL | Status: DC
  Administered 2017-03-11: 500 mg via ORAL
  Filled 2017-03-11: qty 2

## 2017-03-11 MED ORDER — VENLAFAXINE HCL ER 75 MG PO CP24
75.0000 mg | ORAL_CAPSULE | Freq: Every day | ORAL | Status: DC
  Administered 2017-03-11 – 2017-03-12 (×2): 75 mg via ORAL
  Filled 2017-03-11 (×3): qty 1

## 2017-03-11 NOTE — ED Notes (Signed)
Pt requested cookies for snack. Pt given a pack of sugar free shortbread and lemon filled cookies

## 2017-03-11 NOTE — ED Notes (Signed)
Patient denies pain and is resting comfortably.  

## 2017-03-11 NOTE — ED Provider Notes (Signed)
[facility] EMERGENCY DEPARTMENT Provider Note   CSN: [id number] Arrival date & time: 03/11/17  0900     History   Chief Complaint Chief Complaint  Patient presents with  . Drug Problem  . Suicidal    HPI Danny Lester is a 43 y.o. male with a pmh of Cocaine abuse who presents with cc of suicidality.  States that he has had a long-term history of cocaine abuse and he used last night.  Patient states that he has been trying to deal with it on his own but cannot seem to stop his addictive behaviors.  This morning he went to an NA meeting.  The patient states that he has been thinking about killing himself by jumping in front of a train.  He does not have access to weapons.  He denies any homicidal ideation or audiovisual hallucinations.  He knowledge is occasional alcohol and marijuana abuse.  He is a daily smoker.  HPI  Past Medical History:  Diagnosis Date  . Anxiety   . Broken arm   . Depression   . 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 Nerve pain   . PTSD (post-traumatic stress disorder)   . Schizophrenia, paranoid, chronic [facility])     Patient Active Problem List   Diagnosis Date Noted  . Adjustment disorder with depressed mood 01/28/2017  . Pelvic fracture (HCC) 01/02/2017  . Herpes simplex 02/11/2016  . Leukocytosis 12/01/2015  . Carpal tunnel syndrome, bilateral 09/28/2015  . Plantar fasciitis, bilateral 09/28/2015    Past Surgical History:  Procedure Laterality Date  . KNEE SURGERY         Home Medications    Prior to Admission medications   Medication Sig Start Date End Date Taking? Authorizing Provider  acyclovir (ZOVIRAX) 400 MG tablet 1 tab by mouth twice daily Patient taking differently: Take 400 mg by mouth 2 (two) times daily.  02/11/16   Julieanne Manson, MD  baclofen (LIORESAL) 10 MG tablet 1/2 tab by mouth twice daily only as needed Patient not taking: Reported on 03/07/2017 12/04/16   Julieanne Manson, MD  divalproex  (DEPAKOTE) 500 MG DR tablet Take 500-1,000 mg by mouth See admin instructions. Take 2 tablets every morning and take 1 tablet every evening    [provider]  gabapentin (NEURONTIN) 600 MG tablet 1 1/2 tabs by mouth 3 times daily. Patient taking differently: Take 900 mg by mouth 3 (three) times daily.  08/23/16   Julieanne Manson, MD  hydrOXYzine (ATARAX/VISTARIL) 25 MG tablet Take 25 mg by mouth 3 (three) times daily as needed for anxiety.     [provider]  Lurasidone HCl (LATUDA) 120 MG TABS Take 120 mg by mouth daily.     [provider]  traZODone (DESYREL) 150 MG tablet Take 150 mg by mouth at bedtime as needed for sleep.     [provider]  venlafaxine XR (EFFEXOR-XR) 37.5 MG 24 hr capsule Take 75 mg by mouth daily with breakfast.    [provider]    Family History History reviewed. No pertinent family history.  Social History Social History   Tobacco Use  . Smoking status: Current Some Day Smoker    Packs/day: 0.30    Types: Cigarettes  . Smokeless tobacco: Never Used  Substance Use Topics  . Alcohol use: Yes    Comment: beer occasionally  . Drug use: Yes    Types: Marijuana, Cocaine    Comment: Pt stated "I have a long hx  of drug abuse."     Allergies   Penicillins and Risperdal [risperidone]   Review of Systems Review of Systems Ten systems reviewed and are negative for acute change, except as noted in the HPI.    Physical Exam Updated Vital Signs BP 119/79 (BP Location: Right Arm)   Pulse 76   Temp 97.8 F (36.6 C) (Oral)   Resp 17   SpO2 100%   Physical Exam  Constitutional: He appears well-developed and well-nourished. No distress.  HENT:  Head: Normocephalic and atraumatic.  Eyes: Conjunctivae are normal. No scleral icterus.  Neck: Normal range of motion. Neck supple.  Cardiovascular: Normal rate, regular rhythm and normal heart sounds.  Pulmonary/Chest: Effort normal and breath sounds normal. No  respiratory distress.  Abdominal: Soft. There is no tenderness.  Musculoskeletal: He exhibits no edema.  Neurological: He is alert.  Skin: Skin is warm and dry. He is not diaphoretic.  Psychiatric: His behavior is normal.  Nursing note and vitals reviewed.    ED Treatments / Results  Labs (all labs ordered are listed, but only abnormal results are displayed) Labs Reviewed  COMPREHENSIVE METABOLIC PANEL - Abnormal; Notable for the following components:      Result Value   Sodium 134 (*)    Chloride 100 (*)    CO2 21 (*)    All other components within normal limits  ACETAMINOPHEN LEVEL - Abnormal; Notable for the following components:   Acetaminophen (Tylenol), Serum <10 (*)    All other components within normal limits  RAPID URINE DRUG SCREEN, HOSP PERFORMED - Abnormal; Notable for the following components:   Cocaine POSITIVE (*)    Tetrahydrocannabinol POSITIVE (*)    All other components within normal limits  ETHANOL  SALICYLATE LEVEL  CBC    EKG  EKG Interpretation None       Radiology No results found.  Procedures Procedures (including critical care time)  Medications Ordered in ED Medications  acetaminophen (TYLENOL) tablet 650 mg (not administered)  zolpidem (AMBIEN) tablet 5 mg (not administered)  ondansetron (ZOFRAN) tablet 4 mg (not administered)  nicotine (NICODERM CQ - dosed in mg/24 hours) patch 21 mg (not administered)  alum & mag hydroxide-simeth (MAALOX/MYLANTA) 200-200-20 MG/5ML suspension 30 mL (not administered)  acyclovir (ZOVIRAX) tablet 400 mg (not administered)  gabapentin (NEURONTIN) tablet 900 mg (not administered)  divalproex (DEPAKOTE) DR tablet 500-1,000 mg (not administered)  Lurasidone HCl TABS 120 mg (not administered)  hydrOXYzine (ATARAX/VISTARIL) tablet 25 mg (not administered)  traZODone (DESYREL) tablet 150 mg (not administered)  venlafaxine XR (EFFEXOR-XR) 24 hr capsule 75 mg (not administered)     Initial Impression /  Assessment and Plan / ED Course  I have reviewed the triage vital signs and the nursing notes.  Pertinent labs & imaging results that were available during my care of the patient were reviewed by me and considered in my medical decision making (see chart for details).     Patient medically clear for psychiatric workup  Final Clinical Impressions(s) / ED Diagnoses   Final diagnoses:  Cocaine abuse [facility](HCC)  Suicidal thoughts    ED Discharge Orders    None       Arthor CaptainHarris, Rocio Wolak, PA-C 03/11/17 1510    Raeford RazorKohut, Stephen, MD 03/11/17 1529

## 2017-03-11 NOTE — BH Assessment (Addendum)
Tele Assessment Note   Patient Name: Danny RileyDarcell Moseman MRN: [id number] [facility] Physician[facility] Location [facility] Location of Provider: Behavioral Health TTS Department  Danny Lester is a 42 y.o. male with a history of Cocaine abuse who presents with suicidal thoughts of walking in front of a train.  He states that he has had a long-term history of cocaine abuse and he used last night. Patient states that he has been trying to deal with it on his own but cannot seem to stop his addictive behaviors.  This morning he went to an NA meeting and he states that they brought him here to get some help.  Patient states that he has been to the ED numerous times in the past couple months trying to get some help, but has been discharged every time from the ED every time without getting the help that he needs. Patient states that he needs longer term SA treatment. Patient states that he has been feeling suicidal for the past couple months and he has communicated similar plans to hospital staff, but states that he has never acted on these thoughts. Patient states that things go so bad last night that he came really close to carrying it out. He states that he is also having homicidal thoughts toward drug dealers.  He does not have access to weapons.   Patient states that he has a long history of depression and states that [facility]. Patient states that he also hear voices of dead relatives, but denies current visual hallucinations.  Patient presented as pleasant, alert and oriented. He had poor eye contact and his affect was very flat.  Patient was able to communicate clearly. He appeared to have a very depressed mood.  His memory was intact. Patient states that he is struggling with concentration.  He states that he has not been eating well and states that he has lost several pounds.  Patient states that he is only sleeping six hours per night and his energy  level and motivation are low.  Patient states that he has been using cocaine since the age of 82eighteen and he states that he is currently using 3-4 grams daily.  He states that he has been drinking 3-4 beers daily and states that he smokes a blunt of marijuana daily.  His last use of these substances was last night and he does not appear to be currently having any withdrawal complications.    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 Anxiety   . Broken arm   . Depression   . 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 Nerve pain   . PTSD (post-traumatic stress disorder)   . Schizophrenia, paranoid, chronic (HCC)     Past Surgical History:  Procedure Laterality Date  . KNEE SURGERY      Family History: History reviewed. No pertinent family history.  Social History:  reports that he has been smoking cigarettes.  He has been smoking about 0.30 packs per day. he has never used smokeless tobacco. He reports that he drinks alcohol. He reports that he uses drugs. Drugs: Marijuana and Cocaine.  Additional Social History:  Alcohol / Drug Use Pain Medications: denies Prescriptions: denies Over the Counter: denies History of alcohol / drug use?: Yes Longest period of sobriety (when/how long): none Negative Consequences  of Use: Financial, Armed forces operational officer, Personal relationships, Work / School Substance #1 Name of Substance 1: cocaine 1 - Age of First Use: 16 1 - Amount (size/oz): 2-3 grams  1 - Frequency: daily 1 - Duration: since onset 1 - Last Use / Amount: last pm 2-3 grams Substance #2 Name of Substance 2: Marijuana 2 - Age of First Use: 11 2 - Amount (size/oz): 1 blunt 2 - Frequency: daily 2 - Duration: since onset 2 - Last Use / Amount: yesterday, one blunt Substance #3 Name of Substance 3: Alcohol first use was age 72 3 - Last Use / Amount: drinks 3-4 beers  daily, last use was yesterday  CIWA: CIWA-Ar BP: 119/79 Pulse Rate: 76 Nausea and Vomiting: no nausea and no vomiting Tactile Disturbances: none Tremor: no tremor Auditory Disturbances: not present Paroxysmal Sweats: no sweat visible Visual Disturbances: not present Anxiety: no anxiety, at ease Headache, Fullness in Head: none present Agitation: normal activity Orientation and Clouding of Sensorium: cannot do serial additions or is uncertain about date CIWA-Ar Total: 1 COWS:    Allergies:  Allergies  Allergen Reactions  . Penicillins Hives and Swelling    Swelling of face Has patient had a PCN reaction causing immediate rash, facial/tongue/throat swelling, SOB or lightheadedness with hypotension: Yes Has patient had a PCN reaction causing severe rash involving mucus membranes or skin necrosis: Yes Has patient had a PCN reaction that required hospitalization No Has patient had a PCN reaction occurring within the last 10 years: No If all of the above answers are "NO", then may proceed with Cephalosporin use.   Marland Kitchen Risperdal [Risperidone] Other (See Comments)    Per pt: "starts shaking on one side"    Home Medications:  (Not in a hospital admission)  OB/GYN Status:  No LMP for male patient.  General Assessment Data [facility] TTS Assessment: In system Is this a Tele or Face-to-Face Assessment?: Tele Assessment Is this an Initial Assessment or a Re-assessment for this encounter?: Initial Assessment Marital status: Separated Living Arrangements: Other (Comment)(homeless/shelter) Can pt return to current living arrangement?: Yes Admission Status: Voluntary Is patient capable of signing voluntary admission?: Yes Referral Source: Self/Family/Friend Insurance type: (Medicaid)     Crisis Care Plan Living Arrangements: Other (Comment)(homeless/shelter) Legal Guardian: Other:(self) Name of Psychiatrist: Vesta Mixer Dr Merlyn Albert) Name of Therapist:  ([facility])  Education Status Is patient currently in school?: No Current Grade: (GED) Highest grade of school patient has completed: 9 Name of school: [employer])  Risk to self with the past 6 months Suicidal Ideation: Yes-Currently Present Has patient been a risk to self within the past 6 months prior to admission? : Yes Suicidal Intent: Yes-Currently Present Has patient had any suicidal intent within the past 6 months prior to admission? : Yes Is patient at risk for suicide?: No Suicidal Plan?: Yes-Currently Present Has patient had any suicidal plan within the past 6 months prior to admission? : Yes Specify Current Suicidal Plan: (walk in front of a train) Access to Means: Yes Specify Access to Suicidal Means: (local train) What has been your use of drugs/alcohol within the last 12 months?: (yes, uses cocaine, marijuan and alcohol daily) Previous Attempts/Gestures: No How many times?: 0 Other Self Harm Risks: (homeless and minimal support) Triggers for Past Attempts: Unknown Intentional Self Injurious Behavior: None Family Suicide History: No Recent stressful life event(s): Divorce, Financial Problems Persecutory voices/beliefs?: No Depression: Yes Depression Symptoms: Despondent, Loss of interest in usual pleasures, Feeling worthless/self pity Substance abuse  history and/or treatment for substance abuse?: No Suicide prevention information given to non-admitted patients: Not applicable  Risk to Others within the past 6 months Homicidal Ideation: No Does patient have any lifetime risk of violence toward others beyond the six months prior to admission? : No Thoughts of Harm to Others: Yes-Currently Present(thoughts to kill people who sell him drugs) Current Homicidal Intent: No Current Homicidal Plan: No Access to Homicidal Means: No Describe Access to Homicidal Means: (none) History of harm to others?: No Assessment of Violence: On admission Violent Behavior  Description: (none) Does patient have access to weapons?: No Criminal Charges Pending?: No Describe Pending Criminal Charges: (none) Does patient have a court date: No Is patient on probation?: No  Psychosis Hallucinations: Auditory(hears dead relatives) Delusions: None noted  Mental Status Report Appearance/Hygiene: Bizarre, Disheveled, In scrubs Eye Contact: Fair Motor Activity: Unremarkable Speech: Logical/coherent Level of Consciousness: Alert Mood: Depressed, Anxious, Apathetic Affect: Anxious, Apathetic, Depressed Anxiety Level: Moderate Thought Processes: Coherent, Relevant Judgement: Impaired Orientation: Person, Place, Time, Situation Obsessive Compulsive Thoughts/Behaviors: Minimal  Cognitive Functioning Concentration: Decreased Memory: Recent Intact, Remote Intact IQ: Average Insight: Poor Impulse Control: Poor Appetite: Fair Weight Loss: (2-3 lbs) Sleep: Decreased Total Hours of Sleep: (6) Vegetative Symptoms: None  ADLScreening [facility] Assessment Services) Patient's cognitive ability adequate to safely complete daily activities?: Yes Patient able to express need for assistance with ADLs?: Yes Independently performs ADLs?: Yes (appropriate for developmental age)  Prior Inpatient Therapy Prior Inpatient Therapy: No  Prior Outpatient Therapy Prior Outpatient Therapy: Yes Prior Therapy Dates: (active) Prior Therapy Facilty/Provider(s): [facility] curator) Reason for Treatment: (depression) Does patient have an ACCT team?: No Does patient have Intensive In-House Services?  : No Does patient have [facility] services? : Yes Does patient have P4CC services?: No  ADL Screening (condition at time of admission) Patient's cognitive ability adequate to safely complete daily activities?: Yes Is the patient deaf or have difficulty hearing?: No Does the patient have difficulty seeing, even when wearing glasses/contacts?: No Does the patient have difficulty concentrating,  remembering, or making decisions?: No Patient able to express need for assistance with ADLs?: Yes Does the patient have difficulty dressing or bathing?: No Independently performs ADLs?: Yes (appropriate for developmental age) Does the patient have difficulty walking or climbing stairs?: No Weakness of Legs: None Weakness of Arms/Hands: None       Abuse/Neglect Assessment (Assessment to be complete while patient is alone) Abuse/Neglect Assessment Can Be Completed: Yes Physical Abuse: Denies Verbal Abuse: Denies Exploitation of patient/patient's resources: Denies Self-Neglect: Denies     Merchant navy officer (For Healthcare) Does Patient Have a Medical Advance Directive?: No Would patient like information on creating a medical advance directive?: No - Patient declined    Additional Information 1:1 In Past 12 Months?: No CIRT Risk: No Elopement Risk: No Does patient have medical clearance?: Yes     Disposition: Per Leighton Ruff, NP,  patient will need to be monitored for safety and withdrawal potential overnight and will be re-assessed in the morning. Informed Dr. Juleen China of disposition Disposition Initial Assessment Completed for this Encounter: Yes Disposition of Patient: Pending Review with psychiatrist Type of inpatient treatment program: Adult  This service was provided via telemedicine using a 2-way, interactive audio and video technology.  Names of all persons participating in this telemedicine service and their role in this encounter. Name: Antjuan Boomhower Role: Patient  Name:Dyshawn Cangelosi Role:TTS  Name:  Role:  Name:  Role:     Daphene Calamity 03/11/2017 1:31 PM

## 2017-03-11 NOTE — ED Notes (Addendum)
[facility] burgundy [facility]. Staffing Office aware pt in new location. Pt noted to be calm, cooperative, alert. States "I'm going to really get some help this time". Pt requested for RN to call [facility] - [phone]. Called and left message.  .Marland Kitchen

## 2017-03-11 NOTE — ED Triage Notes (Signed)
Pt to ER for detox of cocaine, last use last night reports 2 grams, states suicidal ideation with plan to "get run over by a train," reports hx of same.

## 2017-03-12 DIAGNOSIS — F17210 Nicotine dependence, cigarettes, uncomplicated: Secondary | ICD-10-CM | POA: Diagnosis not present

## 2017-03-12 DIAGNOSIS — F1414 Cocaine abuse with cocaine-induced mood disorder: Secondary | ICD-10-CM | POA: Diagnosis not present

## 2017-03-12 DIAGNOSIS — Z635 Disruption of family by separation and divorce: Secondary | ICD-10-CM

## 2017-03-12 DIAGNOSIS — R45851 Suicidal ideations: Secondary | ICD-10-CM | POA: Diagnosis not present

## 2017-03-12 DIAGNOSIS — R440 Auditory hallucinations: Secondary | ICD-10-CM

## 2017-03-12 DIAGNOSIS — Z590 Homelessness: Secondary | ICD-10-CM | POA: Diagnosis not present

## 2017-03-12 DIAGNOSIS — F1410 Cocaine abuse, uncomplicated: Secondary | ICD-10-CM

## 2017-03-12 DIAGNOSIS — F1099 Alcohol use, unspecified with unspecified alcohol-induced disorder: Secondary | ICD-10-CM

## 2017-03-12 DIAGNOSIS — F1290 Cannabis use, unspecified, uncomplicated: Secondary | ICD-10-CM

## 2017-03-12 NOTE — ED Provider Notes (Signed)
Patient cleared by behavioral health for discharge home.  Follow-up as per behavioral health.   Vanetta MuldersZackowski, Ineze Serrao, MD 03/12/17 [phone]

## 2017-03-12 NOTE — Discharge Instructions (Signed)
Cleared by behavioral health for discharge home.  Follow-up as per behavioral health. 

## 2017-03-12 NOTE — ED Notes (Signed)
Pt awake and cooperative. Speaking with this RN and has no complaints.

## 2017-03-12 NOTE — ED Notes (Signed)
Breakfast tray ordered 

## 2017-03-12 NOTE — ED Notes (Signed)
Pt given d/c papers and all belongings retrieved from security and lockers. Given bus ticket and substance abuse resource guide

## 2017-03-12 NOTE — Consult Note (Addendum)
Telepsych Consultation   Reason for Consult: Substance abuse and suicidal ideations Referring Physician: EDP Location of Patient: [facility] Location of Provider: [facility]  Patient Identification: Danny Lester MRN:  [id number] Principal Diagnosis: Cocaine abuse [facility]) Diagnosis:   Patient Active Problem List   Diagnosis Date Noted  . Cocaine abuse ([place]) [F14.10] 03/12/2017  . Adjustment disorder with depressed mood [F43.21] 01/28/2017  . Pelvic fracture ([facility]) [S32.9XXA] 01/02/2017  . Herpes simplex [B00.9] 02/11/2016  . Leukocytosis [D72.829] 12/01/2015  . Carpal tunnel syndrome, bilateral [G56.03] 09/28/2015  . Plantar fasciitis, bilateral [M72.2] 09/28/2015    Total Time spent with patient: 30 minutes  Subjective:   Danny Lester is a 42 y.o. male patient admitted with Cocaine.  HPI:   Per initial Tele Assessment Note by Beecher Mcardle, Counselor:  Danny Lester a 42 y.o.malewith a history of Cocaine abuse who presents with suicidal thoughts of walking in front of a train.He states that he has had a long-term history of cocaine abuse and he used last night. Patient states that he has been trying to deal with it on his own but cannot seem to stop his addictive behaviors. This morning he went to an NA meeting and he states that they brought him here to get some help.  Patient states that he has been to the ED numerous times in the past couple months trying to get some help, but has been discharged every time from the ED every time without getting the help that he needs. Patient states that he needs longer term SA treatment. Patient states that he has been feeling suicidal for the past couple months and he has communicated similar plans to hospital staff, but states that he has never acted on these thoughts. Patient states that things go so bad last night that he came really close to carrying it out. He states that he is also having homicidal thoughts toward drug  dealers. He does not have access to weapons.   Patient states that he has a long history of depression and states that he has been going to [facility] for the treatment of his depression. Patient states that he also hear voices of dead relatives, but denies current visual hallucinations.  Patient presented as pleasant, alert and oriented. He had poor eye contact and his affect was very flat.  Patient was able to communicate clearly. He appeared to have a very depressed mood.  His memory was intact. Patient states that he is struggling with concentration.  He states that he has not been eating well and states that he has lost several pounds.  Patient states that he is only sleeping six hours per night and his energy level and motivation are low.   On Exam: Patient was seen via tele-psych, chart reviewed with treatment team. Patient in bed, awake, alert and oriented x4. Patient reiterated the reason for this hospital admission as documented above. He reports coming to the ED to obtain help with his cocaine use. Patient per report from TTS staff has been to the ED several times with similar presentation. When asked if he followed up on past resources stated "I need help now. I called but they never have any beds." The process for obtaining help with cocaine/marijuana use was explained to patient.  Patient stated that he is still having some suicide ideations with plan to jump in front of traffic. However, patient stated that he has protective factors and reasons why he wants to be alive. Patient stated that he wants  to be here for his 4 children, his mother and his 2 brothers. Patient stated that he is trying to get clean from drug use.Patient stating that he is committing togetting help with detox. He agrees to follow-up as recommended to [place] treatment facility whenever discharged. Patient currently reporting being homeless due to recent separation from his wife. He is currently staying in a shelter home and he is  able to return to the shelter home whenever discharged. Patient's reports chronic auditory hallucination that has been ongoing but denies any at the moment. Patient denies any current visual hallucination and also denies any homicidal ideations. His urine drug screen was positive for cocaine and marijuana. During this encounter, patient does not appear to be responding to internal stimuli. Patient contracts for safety and agrees to continue following up with resources to obtain help with substance abuse.   Past Psychiatric History: As in H&P  Risk to Self: Suicidal Ideation: No Suicidal Intent: No Is patient at risk for suicide?: No Suicidal Plan?: No but reported prior to admission related to not being able to get substance abuse treatment Specify Current Suicidal Plan: (walk in front of a train) Access to Means: Yes Specify Access to Suicidal Means: (local train) What has been your use of drugs/alcohol within the last 12 months?: (yes, uses cocaine, marijuan and alcohol daily) How many times?: 0 Other Self Harm Risks: (homeless and minimal support) Triggers for Past Attempts: Unknown Intentional Self Injurious Behavior: None Risk to Others: Homicidal Ideation: No Thoughts of Harm to Others: No only wants help for his cocaine use  Current Homicidal Intent: No Current Homicidal Plan: No Access to Homicidal Means: No Describe Access to Homicidal Means: (none) History of harm to others?: No Assessment of Violence: On admission Violent Behavior Description: (none) Does patient have access to weapons?: No Criminal Charges Pending?: No Describe Pending Criminal Charges: (none) Does patient have a court date: No Prior Inpatient Therapy: Prior Inpatient Therapy: No Prior Outpatient Therapy: Prior Outpatient Therapy: Yes Prior Therapy Dates: (active) Prior Therapy Facilty/Provider(s): Consulting civil [facility]) Reason for Treatment: (depression) Does patient have an ACCT team?: No Does patient have  Intensive In-House Services?  : No Does patient have [facility] services? : Yes Does patient have P4CC services?: No  Past Medical History:  Past Medical History:  Diagnosis Date  . Anxiety   . Broken arm   . Depression   . 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 Nerve pain   . PTSD (post-traumatic stress disorder)   . Schizophrenia, paranoid, chronic ([place])     Past Surgical History:  Procedure Laterality Date  . KNEE SURGERY     Family History: History reviewed. No pertinent family history. Family Psychiatric  History: Unknown Social History:  Social History   Substance and Sexual Activity  Alcohol Use Yes   Comment: beer occasionally     Social History   Substance and Sexual Activity  Drug Use Yes  . Types: Marijuana, Cocaine   Comment: Pt stated "I have a long hx of drug abuse."    Social History   Socioeconomic History  . Marital status: Single    Spouse name: None  . Number of children: None  . Years of education: None  . Highest education level: None  Social Needs  . Financial resource strain: None  . Food insecurity - worry: None  . Food insecurity - inability: None  . Transportation needs - medical: None  . Transportation needs - non-medical: None  Occupational History  .  None  Tobacco Use  . Smoking status: Current Some Day Smoker    Packs/day: 0.30    Types: Cigarettes  . Smokeless tobacco: Never Used  Substance and Sexual Activity  . Alcohol use: Yes    Comment: beer occasionally  . Drug use: Yes    Types: Marijuana, Cocaine    Comment: Pt stated "I have a long hx of drug abuse."  . Sexual activity: Yes    Birth control/protection: None  Other Topics Concern  . None  Social History Narrative  . None   Additional Social History:    Allergies:   Allergies  Allergen Reactions  . Penicillins Hives and Swelling    Swelling of face Has patient had a PCN reaction causing immediate rash, facial/tongue/throat swelling, SOB or lightheadedness  with hypotension: Yes Has patient had a PCN reaction causing severe rash involving mucus membranes or skin necrosis: Yes Has patient had a PCN reaction that required hospitalization No Has patient had a PCN reaction occurring within the last 10 years: No If all of the above answers are "NO", then may proceed with Cephalosporin use.   . Pork-Derived Products   . Risperdal [Risperidone] Other (See Comments)    Per pt: "starts shaking on one side"    Labs:  Results for orders placed or performed during the hospital encounter of 03/11/17 (from the past 48 hour(s))  Comprehensive metabolic panel     Status: Abnormal   Collection Time: 03/11/17  9:30 AM  Result Value Ref Range   Sodium 134 (L) 135 - 145 mmol/L   Potassium 3.8 3.5 - 5.1 mmol/L   Chloride 100 (L) 101 - 111 mmol/L   CO2 21 (L) 22 - 32 mmol/L   Glucose, Bld 98 65 - 99 mg/dL   BUN 8 6 - 20 mg/dL   Creatinine, Ser 0.97 0.61 - 1.24 mg/dL   Calcium 9.5 8.9 - 10.3 mg/dL   Total Protein 7.7 6.5 - 8.1 g/dL   Albumin 4.1 3.5 - 5.0 g/dL   AST 24 15 - 41 U/L   ALT 18 17 - 63 U/L   Alkaline Phosphatase 100 38 - 126 U/L   Total Bilirubin 0.9 0.3 - 1.2 mg/dL   GFR calc non Af Amer >60 >60 mL/min   GFR calc Af Amer >60 >60 mL/min    Comment: (NOTE) The eGFR has been calculated using the CKD EPI equation. This calculation has not been validated in all clinical situations. eGFR's persistently <60 mL/min signify possible Chronic Kidney Disease.    Anion gap 13 5 - 15    Comment: Performed at [facility] [address], [place], [place] [address]  Ethanol     Status: None   Collection Time: 03/11/17  9:30 AM  Result Value Ref Range   Alcohol, Ethyl (B) <10 <10 mg/dL    Comment:        LOWEST DETECTABLE LIMIT FOR SERUM ALCOHOL IS 10 mg/dL FOR MEDICAL PURPOSES ONLY Performed at [facility], [place] [address] [place], [place] [address]   Salicylate level     Status: None   Collection Time: 03/11/17  9:30 AM  Result  Value Ref Range   Salicylate Lvl <9.3 2.8 - 30.0 mg/dL    Comment: Performed at [facility] [address] [place], [place] [address]  Acetaminophen level     Status: Abnormal   Collection Time: 03/11/17  9:30 AM  Result Value Ref Range   Acetaminophen (  Tylenol), Serum <10 (L) 10 - 30 ug/mL    Comment:        THERAPEUTIC CONCENTRATIONS VARY SIGNIFICANTLY. A RANGE OF 10-30 ug/mL MAY BE AN EFFECTIVE CONCENTRATION FOR MANY PATIENTS. HOWEVER, SOME ARE BEST TREATED AT CONCENTRATIONS OUTSIDE THIS RANGE. ACETAMINOPHEN CONCENTRATIONS >150 ug/mL AT 4 HOURS AFTER INGESTION AND >50 ug/mL AT 12 HOURS AFTER INGESTION ARE OFTEN ASSOCIATED WITH TOXIC REACTIONS. Performed at [facility], [place] [address], [place], [place] [address]   cbc     Status: None   Collection Time: 03/11/17  9:30 AM  Result Value Ref Range   WBC 10.1 4.0 - 10.5 K/uL   RBC 5.09 4.22 - 5.81 MIL/uL   Hemoglobin 15.7 13.0 - 17.0 g/dL   HCT 45.0 39.0 - 52.0 %   MCV 88.4 78.0 - 100.0 fL   MCH 30.8 26.0 - 34.0 pg   MCHC 34.9 30.0 - 36.0 g/dL   RDW 14.8 11.5 - 15.5 %   Platelets 203 150 - 400 K/uL    Comment: Performed at [facility], [place] [address], [place], [place] [address]  Rapid urine drug screen (hospital performed)     Status: Abnormal   Collection Time: 03/11/17  9:34 AM  Result Value Ref Range   Opiates NONE DETECTED NONE DETECTED   Cocaine POSITIVE (A) NONE DETECTED   Benzodiazepines NONE DETECTED NONE DETECTED   Amphetamines NONE DETECTED NONE DETECTED   Tetrahydrocannabinol POSITIVE (A) NONE DETECTED   Barbiturates NONE DETECTED NONE DETECTED    Comment: (NOTE) DRUG SCREEN FOR MEDICAL PURPOSES ONLY.  IF CONFIRMATION IS NEEDED FOR ANY PURPOSE, NOTIFY LAB WITHIN 5 DAYS. LOWEST DETECTABLE LIMITS FOR URINE DRUG SCREEN Drug Class                     Cutoff (ng/mL) Amphetamine and metabolites    1000 Barbiturate and metabolites    200 Benzodiazepine                 222 Tricyclics and  metabolites     300 Opiates and metabolites        300 Cocaine and metabolites        300 THC                            50 Performed at [facility], [place] [address] [place], [place] [address]     Medications:  Current Facility-Administered Medications  Medication Dose Route Frequency Provider Last Rate Last Dose  . acetaminophen (TYLENOL) tablet 650 mg  650 mg Oral Q4H PRN Margarita Mail, PA-C   650 mg at 03/11/17 1712  . acyclovir (ZOVIRAX) tablet 400 mg  400 mg Oral BID Margarita Mail, PA-C   400 mg at 03/12/17 1042  . alum & mag hydroxide-simeth (MAALOX/MYLANTA) 200-200-20 MG/5ML suspension 30 mL  30 mL Oral Q6H PRN Harris, Abigail, PA-C      . divalproex (DEPAKOTE) DR tablet 1,000 mg  1,000 mg Oral Daily Hammons, Kimberly B, RPH   1,000 mg at 03/12/17 1042  . divalproex (DEPAKOTE) DR tablet 500 mg  500 mg Oral QHS Hammons, Kimberly B, RPH   500 mg at 03/11/17 2223  . gabapentin (NEURONTIN) capsule 900 mg  900 mg Oral TID Margarita Mail, PA-C   900 mg at 03/12/17 1042  . hydrOXYzine (ATARAX/VISTARIL) tablet 25 mg  25 mg Oral TID PRN Margarita Mail, PA-C   25 mg  at 03/11/17 1712  . lurasidone (LATUDA) tablet 120 mg  120 mg Oral Daily Harris, Abigail, PA-C   120 mg at 03/11/17 1712  . nicotine (NICODERM CQ - dosed in mg/24 hours) patch 21 mg  21 mg Transdermal Daily Harris, Abigail, PA-C   21 mg at 03/11/17 1712  . ondansetron (ZOFRAN) tablet 4 mg  4 mg Oral Q8H PRN Margarita Mail, PA-C      . traZODone (DESYREL) tablet 150 mg  150 mg Oral QHS PRN Margarita Mail, PA-C   150 mg at 03/11/17 2224  . venlafaxine XR (EFFEXOR-XR) 24 hr capsule 75 mg  75 mg Oral Q breakfast Harris, Vernie Shanks, PA-C   75 mg at 03/12/17 1042  . zolpidem (AMBIEN) tablet 5 mg  5 mg Oral QHS PRN Margarita Mail, PA-C   5 mg at 03/11/17 2223   Current Outpatient Medications  Medication Sig Dispense Refill  . acyclovir (ZOVIRAX) 400 MG tablet 1 tab by mouth twice daily (Patient taking differently: Take 400  mg by mouth 2 (two) times daily. ) 60 tablet 11  . baclofen (LIORESAL) 10 MG tablet 1/2 tab by mouth twice daily only as needed (Patient taking differently: Take 10 mg by mouth 2 (two) times daily. ) 30 each 3  . divalproex (DEPAKOTE) 500 MG DR tablet Take 500-1,000 mg by mouth 2 (two) times daily. Take 2 tablets every morning and take 1 tablet every evening    . gabapentin (NEURONTIN) 600 MG tablet 1 1/2 tabs by mouth 3 times daily. (Patient taking differently: Take 900 mg by mouth 3 (three) times daily. ) 135 tablet 11  . hydrOXYzine (ATARAX/VISTARIL) 25 MG tablet Take 25 mg by mouth 3 (three) times daily as needed for anxiety.     . Lurasidone HCl (LATUDA) 120 MG TABS Take 120 mg by mouth at bedtime.     . traZODone (DESYREL) 150 MG tablet Take 150 mg by mouth at bedtime as needed for sleep.     Marland Kitchen venlafaxine XR (EFFEXOR-XR) 75 MG 24 hr capsule Take 75 mg by mouth every morning.      Musculoskeletal: UTA via camera  Psychiatric Specialty Exam: Physical Exam  Nursing note and vitals reviewed.   Review of Systems  Psychiatric/Behavioral: Positive for depression (Related to substance abuse issues) and substance abuse. Negative for hallucinations, memory loss and suicidal ideas. The patient is not nervous/anxious and does not have insomnia.   All other systems reviewed and are negative.   Blood pressure 115/74, pulse (!) 52, temperature 98.9 F (37.2 C), temperature source Oral, resp. rate 18, SpO2 97 %.There is no height or weight on file to calculate BMI.  General Appearance: on hospital scrub  Eye Contact:  Minimal  Speech:  Clear and Coherent and Normal Rate  Volume:  Normal  Mood:  Anxious, Depressed and Hopeless  Affect:  Blunt and Congruent  Thought Process:  Coherent and Goal Directed  Orientation:  Full (Time, Place, and Person)  Thought Content:  WDL and Logical  Suicidal Thoughts:  Chronic passive SI with plan to jump infront of a traffic   Homicidal Thoughts:  No  Memory:   Immediate;   Good Recent;   Good Remote;   Fair  Judgement:  Intact  Insight:  Present  Psychomotor Activity:  Normal  Concentration:  Concentration: Good and Attention Span: Good  Recall:  Good  Fund of Knowledge:  Good  Language:  Good  Akathisia:  Negative  Handed:  Right  AIMS (if indicated):  Assets:  Communication Skills Desire for Improvement Leisure Time Social Support  ADL's:  Intact  Cognition:  WNL  Sleep:        Treatment Plan Summary: Plan to discharge with substance abuse treatment facilities resources Continue your current medications without change and take them regularly  Continue your OP follow up with [facility] Follow up with substance abuse treatment facilities for detox Follow up with your therapist for counseling Avoid the use of drugs and/or alcohol Follow up with your provider or return to the ED with any new or worsening depressive symptoms  Disposition: No evidence of imminent risk to self or others at present.   Patient does not meet criteria for psychiatric inpatient admission. Supportive therapy provided about ongoing stressors. Refer to IOP. Discussed crisis plan, support from social network, calling 911, coming to the Emergency Department, and calling Suicide Hotline.  This service was provided via telemedicine using a 2-way, interactive audio and video technology.  Names of all persons participating in this telemedicine service and their role in this encounter. Name: Offie Rutt Role: Patient  Name: Elmarie Shiley  Role: NP           Elmarie Shiley, NP 03/12/2017 10:48 AM    Agree with NP Assessment

## 2017-03-12 NOTE — ED Notes (Signed)
TTS being done at at beside

## 2017-03-14 ENCOUNTER — Emergency Department (HOSPITAL_COMMUNITY)
Admission: EM | Admit: 2017-03-14 | Discharge: 2017-03-14 | Disposition: A | Discharge status: Home / Self Care | Payer: Medicaid Other | Attending: Emergency Medicine | Admitting: Emergency Medicine

## 2017-03-14 ENCOUNTER — Other Ambulatory Visit: Payer: Self-pay

## 2017-03-14 ENCOUNTER — Encounter (HOSPITAL_COMMUNITY): Payer: Self-pay | Admitting: Nurse Practitioner

## 2017-03-14 DIAGNOSIS — Z79899 Other long term (current) drug therapy: Secondary | ICD-10-CM | POA: Diagnosis not present

## 2017-03-14 DIAGNOSIS — F17210 Nicotine dependence, cigarettes, uncomplicated: Secondary | ICD-10-CM | POA: Diagnosis not present

## 2017-03-14 DIAGNOSIS — F1410 Cocaine abuse, uncomplicated: Secondary | ICD-10-CM | POA: Insufficient documentation

## 2017-03-14 DIAGNOSIS — F332 Major depressive disorder, recurrent severe without psychotic features: Secondary | ICD-10-CM | POA: Insufficient documentation

## 2017-03-14 DIAGNOSIS — F329 Major depressive disorder, single episode, unspecified: Secondary | ICD-10-CM | POA: Diagnosis present

## 2017-03-14 MED ORDER — GABAPENTIN 300 MG PO CAPS
900.0000 mg | ORAL_CAPSULE | Freq: Three times a day (TID) | ORAL | Status: DC
  Administered 2017-03-14: 900 mg via ORAL
  Filled 2017-03-14: qty 3

## 2017-03-14 MED ORDER — VENLAFAXINE HCL ER 75 MG PO CP24
75.0000 mg | ORAL_CAPSULE | Freq: Every morning | ORAL | Status: DC
  Administered 2017-03-14: 75 mg via ORAL
  Filled 2017-03-14: qty 1

## 2017-03-14 MED ORDER — LURASIDONE HCL 60 MG PO TABS: 120.0000 mg | ORAL_TABLET | Freq: Every day | ORAL | Status: DC

## 2017-03-14 MED ORDER — GABAPENTIN 600 MG PO TABS
900.0000 mg | ORAL_TABLET | Freq: Three times a day (TID) | ORAL | Status: DC
  Filled 2017-03-14 (×2): qty 1.5

## 2017-03-14 MED ORDER — BACLOFEN 10 MG PO TABS
10.0000 mg | ORAL_TABLET | Freq: Two times a day (BID) | ORAL | Status: DC
  Administered 2017-03-14: 10 mg via ORAL
  Filled 2017-03-14: qty 1
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
## 2017-03-14 NOTE — BHH Suicide Risk Assessment (Cosign Needed)
Suicide Risk Assessment  Discharge Assessment   [facility]   Principal Problem: <principal problem not specified> Discharge Diagnoses:  Patient Active Problem List   Diagnosis Date Noted  . Cocaine abuse (HCC) [F14.10] 03/12/2017  . Adjustment disorder with depressed mood [F43.21] 01/28/2017  . Pelvic fracture (HCC) [S32.9XXA] 01/02/2017  . Herpes simplex [B00.9] 02/11/2016  . Leukocytosis [D72.829] 12/01/2015  . Carpal tunnel syndrome, bilateral [G56.03] 09/28/2015  . Plantar fasciitis, bilateral [M72.2] 09/28/2015   Pt was seen and chart reviewed with treatment team. Pt has been in the ED 11 times in the past 6 months and [facility]. Pt has a high degree of secondary gain by seeking shelter and food in the ED as he is homeless. Pt presents in the same manner each time. Pt's UDS is positive for cocaine and THC, BAL negative. Pt is psychiatrically clear for discharge.   Total Time spent with patient: 30 minutes  Musculoskeletal: Strength & Muscle Tone: within normal limits Gait & Station: normal Patient leans: N/A  Psychiatric Specialty Exam:   Height 5\' 7"  (1.702 m), weight 73.9 kg (163 lb).Body mass index is 25.53 kg/m.  General Appearance: Casual  Eye Contact::  Fair  Speech:  Pressured409  Volume:  Increased  Mood:  Angry, Anxious and Irritable  Affect:  Congruent  Thought Process:  Coherent, Goal Directed and Linear  Orientation:  Full (Time, Place, and Person)  Thought Content:  Logical  Suicidal Thoughts:  No  Homicidal Thoughts:  No  Memory:  Immediate;   Good Recent;   Good Remote;   Fair  Judgement:  Poor  Insight:  Lacking  Psychomotor Activity:  Normal  Concentration:  Good  Recall:  Good  Fund of Knowledge:Good  Language: Good  Akathisia:  No  Handed:  Right  AIMS (if indicated):     Assets:  Communication Skills  Sleep:     Cognition: WNL  ADL's:  Intact   Mental Status Per Nursing  Assessment::   On Admission:   Suicidal ideation while high on cocaine  Demographic Factors:  Male, Low socioeconomic status and Unemployed  Loss Factors: Legal issues and Financial problems/change in socioeconomic status  Historical Factors: Impulsivity  Risk Reduction Factors:   Sense of responsibility to family  Continued Clinical Symptoms:  Depression:   Comorbid alcohol abuse/dependence Impulsivity Alcohol/Substance Abuse/Dependencies Previous Psychiatric Diagnoses and Treatments Medical Diagnoses and Treatments/Surgeries  Cognitive Features That Contribute To Risk:  Closed-mindedness    Suicide Risk:  Minimal: No identifiable suicidal ideation.  Patients presenting with no risk factors but with morbid ruminations; may be classified as minimal risk based on the severity of the depressive symptoms    Plan Of Care/Follow-up recommendations:  Activity:  as tolerated Diet:  Heart Healthy  Laveda AbbeLaurie Britton Rudine Rieger, NP 03/14/2017, 1:03 PM

## 2017-03-14 NOTE — ED Triage Notes (Signed)
[facility] [place]. Patient is normally homeless. Patient is SI and has issues with substance abuse. Patient was just here and is upset about the discharge process. He does not feel he has a plan when he is discharged and that we aren't doing anything to help him. That makes him angry and wants to harm himself.

## 2017-03-14 NOTE — BH Assessment (Signed)
[facility]  Per Juanetta BeetsJacqueline Norman, DO, this pt does not require psychiatric hospitalization at this time.  [facility].  He is to be provided with referral information for [facility] for his substance abuse treatment needs.  This has been included in pt's discharge instructions.  Pt's nurse, Donnal DebarRandi, has been notified.  Doylene Canninghomas Rosa Gambale, MA Triage Specialist [phone]
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
## 2017-03-14 NOTE — Patient Outreach (Signed)
[facility] met with the patient and tried to provide to substance use recovery support. Patient became extremely agitated and starting cursing at [place] demanding them to leave. Patient is frustrated that he has not gotten placed at a substance use treatment facility. [facility]PSS tried explaining to the patient that it can take time to gain access to a long term treatment facility due to high volume of patients at facilities in the [place] area. CPSS still left substance use recovery resources in the patient's room including a list of NA/AA meetings, residential/outpatient treatment centers, and  information for [facility].

## 2017-03-14 NOTE — Discharge Instructions (Signed)
For your mental health needs, you are advised to continue treatment with [facility]:       [facility]      [address]      [place], [place] [address]      [phone]  To help you maintain a sober lifestyle, a substance abuse treatment program may be beneficial to you.  Contact [facility] at your earliest opportunity to ask about enrolling in their program:       [facility]      [address]      , [place] [address]      [phone]      New patients are seen at the walk-in clinic every Tuesday from 9:00 am - 12:00 pm

## 2017-03-14 NOTE — ED Notes (Signed)
Bed: [id number] Expected date: 03/14/17 Expected time: 7:45 AM Means of arrival: Ambulance Comments: SI Vol with [employer]

## 2017-03-24 ENCOUNTER — Other Ambulatory Visit: Payer: Self-pay

## 2017-03-24 ENCOUNTER — Emergency Department (HOSPITAL_BASED_OUTPATIENT_CLINIC_OR_DEPARTMENT_OTHER)
Admission: EM | Admit: 2017-03-24 | Discharge: 2017-03-24 | Disposition: A | Discharge status: Home / Self Care | Payer: Medicaid Other | Attending: Emergency Medicine | Admitting: Emergency Medicine

## 2017-03-24 ENCOUNTER — Emergency Department (HOSPITAL_BASED_OUTPATIENT_CLINIC_OR_DEPARTMENT_OTHER): Payer: Medicaid Other

## 2017-03-24 ENCOUNTER — Encounter (HOSPITAL_BASED_OUTPATIENT_CLINIC_OR_DEPARTMENT_OTHER): Payer: Self-pay | Admitting: Emergency Medicine

## 2017-03-24 DIAGNOSIS — M79604 Pain in right leg: Secondary | ICD-10-CM | POA: Insufficient documentation

## 2017-03-24 DIAGNOSIS — Z79899 Other long term (current) drug therapy: Secondary | ICD-10-CM | POA: Insufficient documentation

## 2017-03-24 DIAGNOSIS — M79605 Pain in left leg: Secondary | ICD-10-CM | POA: Insufficient documentation

## 2017-03-24 DIAGNOSIS — M7989 Other specified soft tissue disorders: Secondary | ICD-10-CM

## 2017-03-24 DIAGNOSIS — F17210 Nicotine dependence, cigarettes, uncomplicated: Secondary | ICD-10-CM | POA: Insufficient documentation

## 2017-03-24 MED ORDER — GABAPENTIN 300 MG PO CAPS
600.0000 mg | ORAL_CAPSULE | Freq: Once | ORAL | Status: AC
  Administered 2017-03-24: 600 mg via ORAL
  Filled 2017-03-24: qty 2

## 2017-03-24 MED ORDER — GABAPENTIN 600 MG PO TABS: 600.0000 mg | ORAL_TABLET | Freq: Three times a day (TID) | ORAL | 0 refills | Status: DC

## 2017-03-24 NOTE — ED Notes (Signed)
Pt reports he is out of gabapentin.

## 2017-03-24 NOTE — Discharge Instructions (Signed)
Start taking gabapentin again.  Take 1 tablet once a day for 3 days then increase to 1 tablet twice a day for 3 days then increase to 1 tablet 3 times a day.  Follow-up with your physician for reevaluation as scheduled.  Return to the emergency department if any concerning signs or symptoms develop such as fevers, worsening swelling, chest pain, or shortness of breath.

## 2017-03-24 NOTE — ED Triage Notes (Signed)
[facility], c/o bilateral lower leg pain and swelling since yesterday.
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
## 2017-04-16 ENCOUNTER — Telehealth: Payer: Self-pay | Admitting: Internal Medicine

## 2017-04-16 NOTE — Telephone Encounter (Signed)
Spoke with patient. Informed he would need to be seen before he could get a refill. Patient is scheduled for 04/17/17 @ 4 pm.

## 2017-04-16 NOTE — Telephone Encounter (Signed)
Patient needs Rx refill baclofen (LIORESAL) 10 mg.  [facility] [address] is pharmacy patient would like to use.

## 2017-04-17 ENCOUNTER — Ambulatory Visit: Payer: Self-pay | Admitting: Internal Medicine

## 2017-05-09 ENCOUNTER — Encounter: Payer: Self-pay | Admitting: Emergency Medicine

## 2017-05-09 ENCOUNTER — Emergency Department
Admission: EM | Admit: 2017-05-09 | Discharge: 2017-05-10 | Disposition: A | Discharge status: Other Acute Inpatient Hospital | Payer: Medicaid Other | Attending: Emergency Medicine | Admitting: Emergency Medicine

## 2017-05-09 DIAGNOSIS — R45851 Suicidal ideations: Secondary | ICD-10-CM | POA: Diagnosis not present

## 2017-05-09 DIAGNOSIS — F332 Major depressive disorder, recurrent severe without psychotic features: Secondary | ICD-10-CM | POA: Diagnosis not present

## 2017-05-09 DIAGNOSIS — F4321 Adjustment disorder with depressed mood: Secondary | ICD-10-CM | POA: Insufficient documentation

## 2017-05-09 DIAGNOSIS — F329 Major depressive disorder, single episode, unspecified: Secondary | ICD-10-CM | POA: Diagnosis present

## 2017-05-09 DIAGNOSIS — F1010 Alcohol abuse, uncomplicated: Secondary | ICD-10-CM | POA: Diagnosis not present

## 2017-05-09 DIAGNOSIS — F1410 Cocaine abuse, uncomplicated: Secondary | ICD-10-CM | POA: Diagnosis not present

## 2017-05-09 DIAGNOSIS — B009 Herpesviral infection, unspecified: Secondary | ICD-10-CM | POA: Insufficient documentation

## 2017-05-09 DIAGNOSIS — F1910 Other psychoactive substance abuse, uncomplicated: Secondary | ICD-10-CM

## 2017-05-09 LAB — ACETAMINOPHEN LEVEL

## 2017-05-09 LAB — ETHANOL: Alcohol, Ethyl (B): <10 mg/dL (ref <10)

## 2017-05-09 LAB — CBC
HEMATOCRIT: 45.5 % (ref 40.0–52.0)
Hemoglobin: 15.4 g/dL (ref 13.0–18.0)
MCH: 29.3 pg (ref 26.0–34.0)
MCHC: 33.9 g/dL (ref 32.0–36.0)
MCV: 86.3 fL (ref 80.0–100.0)
Platelets: 250 10*3/uL (ref 150–440)
RBC: 5.27 MIL/uL (ref 4.40–5.90)
RDW: 15.6 % — AB (ref 11.5–14.5)
WBC: 14.6 10*3/uL — ABNORMAL HIGH (ref 3.8–10.6)

## 2017-05-09 LAB — URINE DRUG SCREEN, QUALITATIVE (ARMC ONLY)
Amphetamines, Ur Screen: NOT DETECTED
BARBITURATES, UR SCREEN: NOT DETECTED
BENZODIAZEPINE, UR SCRN: NOT DETECTED
CANNABINOID 50 NG, UR ~~LOC~~: NOT DETECTED
Cocaine Metabolite,Ur ~~LOC~~: POSITIVE — AB
MDMA (Ecstasy)Ur Screen: NOT DETECTED
METHADONE SCREEN, URINE: NOT DETECTED
OPIATE, UR SCREEN: NOT DETECTED
PHENCYCLIDINE (PCP) UR S: NOT DETECTED
Tricyclic, Ur Screen: NOT DETECTED

## 2017-05-09 LAB — COMPREHENSIVE METABOLIC PANEL
ALBUMIN: 4.4 g/dL (ref 3.5–5.0)
ALK PHOS: 97 U/L (ref 38–126)
ALT: 15 U/L — ABNORMAL LOW (ref 17–63)
ANION GAP: 8 (ref 5–15)
AST: 23 U/L (ref 15–41)
BILIRUBIN TOTAL: 1.3 mg/dL — AB (ref 0.3–1.2)
BUN: 8 mg/dL (ref 6–20)
CALCIUM: 9.2 mg/dL (ref 8.9–10.3)
CO2: 25 mmol/L (ref 22–32)
Chloride: 104 mmol/L (ref 101–111)
Creatinine, Ser: 0.81 mg/dL (ref 0.61–1.24)
GFR calc non Af Amer: >60 mL/min (ref >60)
GLUCOSE: 99 mg/dL (ref 65–99)
Potassium: 3.4 mmol/L — ABNORMAL LOW (ref 3.5–5.1)
Sodium: 137 mmol/L (ref 135–145)
TOTAL PROTEIN: 8.2 g/dL — AB (ref 6.5–8.1)

## 2017-05-09 LAB — SALICYLATE LEVEL: Salicylate Lvl: <7 mg/dL (ref 2.8–30.0)

## 2017-05-09 MED ORDER — LORAZEPAM 2 MG PO TABS
0.0000 mg | ORAL_TABLET | Freq: Four times a day (QID) | ORAL | Status: DC
  Administered 2017-05-09 – 2017-05-10 (×2): 1 mg via ORAL
  Filled 2017-05-09 (×2): qty 1

## 2017-05-09 MED ORDER — THIAMINE HCL 100 MG/ML IJ SOLN: 100.0000 mg | Freq: Every day | INTRAMUSCULAR | Status: DC

## 2017-05-09 MED ORDER — LORAZEPAM 2 MG/ML IJ SOLN: 0.0000 mg | Freq: Two times a day (BID) | INTRAMUSCULAR | Status: DC

## 2017-05-09 MED ORDER — VITAMIN B-1 100 MG PO TABS
100.0000 mg | ORAL_TABLET | Freq: Every day | ORAL | Status: DC
  Administered 2017-05-09 – 2017-05-10 (×2): 100 mg via ORAL
  Filled 2017-05-09 (×2): qty 1

## 2017-05-09 MED ORDER — LORAZEPAM 2 MG PO TABS: 0.0000 mg | ORAL_TABLET | Freq: Two times a day (BID) | ORAL | Status: DC

## 2017-05-09 MED ORDER — LORAZEPAM 2 MG/ML IJ SOLN: 0.0000 mg | Freq: Four times a day (QID) | INTRAMUSCULAR | Status: DC

## 2017-05-09 MED ORDER — DIVALPROEX SODIUM 500 MG PO DR TAB
500.0000 mg | DELAYED_RELEASE_TABLET | Freq: Two times a day (BID) | ORAL | Status: DC
  Administered 2017-05-09 – 2017-05-10 (×2): 500 mg via ORAL
  Filled 2017-05-09 (×3): qty 1

## 2017-05-09 MED ORDER — HYDROXYZINE HCL 25 MG PO TABS
50.0000 mg | ORAL_TABLET | Freq: Two times a day (BID) | ORAL | Status: DC
  Administered 2017-05-09 – 2017-05-10 (×2): 50 mg via ORAL
  Filled 2017-05-09 (×2): qty 2

## 2017-05-09 MED ORDER — LORAZEPAM 1 MG PO TABS
ORAL_TABLET | ORAL | Status: AC
  Administered 2017-05-09: 1 mg via ORAL
  Filled 2017-05-09: qty 1

## 2017-05-09 NOTE — ED Notes (Addendum)
Pt changed into BHU clothing, transferred to Rm 22

## 2017-05-09 NOTE — ED Notes (Signed)

## 2017-05-09 NOTE — ED Triage Notes (Signed)
Pt ambulatory to triage with steady gait, no distress noted. Pt to ED voluntary for detox of cocaine and for increased SI in last week. Pt reports he has used cocaine since he was 16 and wants help quitting. Pt expresses's he has been under a lot of stress and does not have a good living situation currently. Pt is calm and cooperative in triage. Pt is SI with plan of using razors today. Pt denies HI.

## 2017-05-09 NOTE — ED Notes (Signed)
Pt. Alert and oriented, warm and dry, in no distress. Pt. Denies HI, and AVH. Pt states he was having SI thoughts to hurt self earlier but not right now. Patient was able to verbally contract for safety with this Clinical research associate. Pt. Encouraged to let nursing staff know of any concerns or needs.

## 2017-05-09 NOTE — ED Notes (Signed)
SOC called for consult [id number]

## 2017-05-09 NOTE — ED Provider Notes (Signed)
[facility] Emergency Department Provider Note  ___________________________________________   First MD Initiated Contact with Patient 05/09/17 2026     (approximate)  I have reviewed the triage vital signs and the nursing notes.   HISTORY  Chief Complaint Mental Health Problem   HPI Danny Lester is a 42 y.o. male a long-standing history of cocaine abuse, alcohol abuse as well as multiple personality disorder who is presenting to the emergency department today with suicidal thoughts that have been worsening over the past week.  He says that he is been thinking of cutting himself with razor blades today.  He says that he also wants help detoxing from his polysubstance abuse.  He says that he drinks several 40s per day.  Says that he also snorts as well as smokes cocaine that he laces with his marijuana.  Also asking for gabapentin for his ongoing back pain.  Said that he last drank as well as used cocaine earlier today.   Past Medical History:  Diagnosis Date  . Anxiety   . Broken arm   . Depression   . 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 Nerve pain   . PTSD (post-traumatic stress disorder)   . Schizophrenia, paranoid, chronic [facility])     Patient Active Problem List   Diagnosis Date Noted  . Cocaine abuse (HCC) 03/12/2017  . Adjustment disorder with depressed mood 01/28/2017  . Pelvic fracture (HCC) 01/02/2017  . Herpes simplex 02/11/2016  . Leukocytosis 12/01/2015  . Carpal tunnel syndrome, bilateral 09/28/2015  . Plantar fasciitis, bilateral 09/28/2015    Past Surgical History:  Procedure Laterality Date  . KNEE SURGERY      Prior to Admission medications   Medication Sig Start Date End Date Taking? Authorizing Provider  acyclovir (ZOVIRAX) 400 MG tablet 1 tab by mouth twice daily Patient not taking: Reported on 03/14/2017 02/11/16   Julieanne Manson, MD  baclofen (LIORESAL) 10 MG tablet 1/2 tab by mouth twice daily only as needed Patient  not taking: Reported on 03/14/2017 12/04/16   Julieanne Manson, MD  divalproex (DEPAKOTE) 500 MG DR tablet Take 500-1,000 mg by mouth 2 (two) times daily. Take 2 tablets every morning and take 1 tablet every evening    [provider]  gabapentin (NEURONTIN) 600 MG tablet Take 1 tablet (600 mg total) by mouth 3 (three) times daily for 7 days. 03/24/17 03/31/17  Michela Pitcher A, PA-C  hydrOXYzine (ATARAX/VISTARIL) 25 MG tablet Take 25 mg by mouth 3 (three) times daily as needed for anxiety.     [provider]  Lurasidone HCl (LATUDA) 120 MG TABS Take 120 mg by mouth at bedtime.     [provider]  traZODone (DESYREL) 150 MG tablet Take 150 mg by mouth at bedtime as needed for sleep.     [provider]  venlafaxine XR (EFFEXOR-XR) 75 MG 24 hr capsule Take 75 mg by mouth every morning.    [provider]    Allergies Penicillins; Pork-derived products; and Risperdal [risperidone]  History reviewed. No pertinent family history.  Social History Social History   Tobacco Use  . Smoking status: Current Some Day Smoker    Packs/day: 0.30    Types: Cigarettes  . Smokeless tobacco: Never Used  Substance Use Topics  . Alcohol use: Yes    Comment: beer occasionally  . Drug use: Yes    Types: Marijuana, Cocaine    Comment: Pt stated "I have a long hx of drug abuse."  Review of Systems  Constitutional: No fever/chills Eyes: No visual changes. ENT: No sore throat. Cardiovascular: Denies chest pain. Respiratory: Denies shortness of breath. Gastrointestinal: No abdominal pain.  No nausea, no vomiting.  No diarrhea.  No constipation. Genitourinary: Negative for dysuria. Musculoskeletal: Chronic back pain that is unchanged. Skin: Negative for rash. Neurological: Negative for headaches, focal weakness or numbness.   ____________________________________________   PHYSICAL EXAM:  VITAL SIGNS: ED Triage Vitals  Enc Vitals Group     BP  05/09/17 2012 123/73     Pulse Rate 05/09/17 2012 67     Resp 05/09/17 2012 17     Temp 05/09/17 2012 98 F (36.7 C)     Temp Source 05/09/17 2012 Oral     SpO2 05/09/17 2012 98 %     Weight 05/09/17 2005 163 lb (73.9 kg)     Height --      Head Circumference --      Peak Flow --      Pain Score --      Pain Loc --      Pain Edu? --      Excl. in GC? --     Constitutional: Alert and oriented. Well appearing and in no acute distress. Eyes: Conjunctivae are normal.  Head: Atraumatic. Nose: No congestion/rhinnorhea. Mouth/Throat: Mucous membranes are moist.  Neck: No stridor.   Cardiovascular: Normal rate, regular rhythm. Grossly normal heart sounds.   Respiratory: Normal respiratory effort.  No retractions. Lungs CTAB. Gastrointestinal: Soft and nontender. No distention.  Musculoskeletal: No lower extremity tenderness nor edema.  No joint effusions. Neurologic:  Normal speech and language. No gross focal neurologic deficits are appreciated. Skin:  Skin is warm, dry and intact. No rash noted. Psychiatric: Speaking very quietly.  Appears to have a flat affect.  ____________________________________________   LABS (all labs ordered are listed, but only abnormal results are displayed)  Labs Reviewed  COMPREHENSIVE METABOLIC PANEL - Abnormal; Notable for the following components:      Result Value   Potassium 3.4 (*)    Total Protein 8.2 (*)    ALT 15 (*)    Total Bilirubin 1.3 (*)    All other components within normal limits  ACETAMINOPHEN LEVEL - Abnormal; Notable for the following components:   Acetaminophen (Tylenol), Serum <10 (*)    All other components within normal limits  CBC - Abnormal; Notable for the following components:   WBC 14.6 (*)    RDW 15.6 (*)    All other components within normal limits  URINE DRUG SCREEN, QUALITATIVE ([facility] ONLY) - Abnormal; Notable for the following components:   Cocaine Metabolite,[place] POSITIVE (*)    All other components within  normal limits  SALICYLATE LEVEL  ETHANOL   ____________________________________________  EKG   ____________________________________________  RADIOLOGY   ____________________________________________   PROCEDURES  Procedure(s) performed:   Procedures  Critical Care performed:   ____________________________________________   INITIAL IMPRESSION / ASSESSMENT AND PLAN / ED COURSE  Pertinent labs & imaging results that were available during my care of the patient were reviewed by me and considered in my medical decision making (see chart for details).  DDX: Polysubstance abuse, psychosis, intoxication, suicidal ideation, schizophrenia As part of my medical decision making, I reviewed the following data within the electronic MEDICAL RECORD NUMBER Notes from prior ED visits  Patient will be placed under involuntary commitment.  I will also order the CIWA protocol. ____________________________________________   FINAL CLINICAL IMPRESSION(S) / ED DIAGNOSES  Polysubstance  abuse.  Suicidal ideation.    NEW MEDICATIONS STARTED DURING THIS VISIT:  New Prescriptions   No medications on file     Note:  This document was prepared using Dragon voice recognition software and may include unintentional dictation errors.     Myrna Blazer, MD 05/09/17 2112

## 2017-05-10 ENCOUNTER — Inpatient Hospital Stay
Admission: AD | Admit: 2017-05-10 | Discharge: 2017-05-16 | DRG: 885 | Disposition: A | Discharge status: Home / Self Care | Payer: Medicaid Other | Attending: Psychiatry | Admitting: Psychiatry

## 2017-05-10 ENCOUNTER — Other Ambulatory Visit: Payer: Self-pay

## 2017-05-10 DIAGNOSIS — R45851 Suicidal ideations: Secondary | ICD-10-CM | POA: Diagnosis present

## 2017-05-10 DIAGNOSIS — Z888 Allergy status to other drugs, medicaments and biological substances status: Secondary | ICD-10-CM | POA: Diagnosis not present

## 2017-05-10 DIAGNOSIS — M62830 Muscle spasm of back: Secondary | ICD-10-CM | POA: Diagnosis not present

## 2017-05-10 DIAGNOSIS — F1910 Other psychoactive substance abuse, uncomplicated: Secondary | ICD-10-CM | POA: Diagnosis present

## 2017-05-10 DIAGNOSIS — F1410 Cocaine abuse, uncomplicated: Secondary | ICD-10-CM | POA: Diagnosis present

## 2017-05-10 DIAGNOSIS — G4700 Insomnia, unspecified: Secondary | ICD-10-CM | POA: Diagnosis present

## 2017-05-10 DIAGNOSIS — B009 Herpesviral infection, unspecified: Secondary | ICD-10-CM | POA: Diagnosis present

## 2017-05-10 DIAGNOSIS — F332 Major depressive disorder, recurrent severe without psychotic features: Secondary | ICD-10-CM

## 2017-05-10 DIAGNOSIS — F1010 Alcohol abuse, uncomplicated: Secondary | ICD-10-CM | POA: Diagnosis present

## 2017-05-10 DIAGNOSIS — Z653 Problems related to other legal circumstances: Secondary | ICD-10-CM | POA: Diagnosis not present

## 2017-05-10 DIAGNOSIS — Z79899 Other long term (current) drug therapy: Secondary | ICD-10-CM | POA: Diagnosis not present

## 2017-05-10 DIAGNOSIS — F4310 Post-traumatic stress disorder, unspecified: Secondary | ICD-10-CM | POA: Diagnosis present

## 2017-05-10 DIAGNOSIS — Z91018 Allergy to other foods: Secondary | ICD-10-CM | POA: Diagnosis not present

## 2017-05-10 DIAGNOSIS — Z880 Allergy status to penicillin: Secondary | ICD-10-CM | POA: Diagnosis not present

## 2017-05-10 DIAGNOSIS — F200 Paranoid schizophrenia: Secondary | ICD-10-CM | POA: Diagnosis present

## 2017-05-10 MED ORDER — LORAZEPAM 2 MG PO TABS
0.0000 mg | ORAL_TABLET | Freq: Four times a day (QID) | ORAL | Status: DC
  Administered 2017-05-10: 2 mg via ORAL
  Filled 2017-05-10: qty 1

## 2017-05-10 MED ORDER — LORAZEPAM 2 MG/ML IJ SOLN: 0.0000 mg | Freq: Four times a day (QID) | INTRAMUSCULAR | Status: DC

## 2017-05-10 MED ORDER — HYDROXYZINE HCL 50 MG PO TABS
50.0000 mg | ORAL_TABLET | Freq: Two times a day (BID) | ORAL | Status: DC
  Administered 2017-05-10 – 2017-05-16 (×12): 50 mg via ORAL
  Filled 2017-05-10 (×12): qty 1

## 2017-05-10 MED ORDER — GABAPENTIN 300 MG PO CAPS
900.0000 mg | ORAL_CAPSULE | Freq: Three times a day (TID) | ORAL | Status: DC
  Administered 2017-05-10: 900 mg via ORAL
  Filled 2017-05-10: qty 3

## 2017-05-10 MED ORDER — MAGNESIUM HYDROXIDE 400 MG/5ML PO SUSP
30.0000 mL | Freq: Every day | ORAL | Status: DC | PRN
Start: 2017-05-10 — End: 2017-05-16

## 2017-05-10 MED ORDER — GABAPENTIN 300 MG PO CAPS
ORAL_CAPSULE | ORAL | Status: AC
  Administered 2017-05-10: 900 mg via ORAL
  Filled 2017-05-10: qty 1

## 2017-05-10 MED ORDER — DIVALPROEX SODIUM 500 MG PO DR TAB
1000.0000 mg | DELAYED_RELEASE_TABLET | Freq: Two times a day (BID) | ORAL | Status: DC
  Administered 2017-05-10 – 2017-05-11 (×2): 1000 mg via ORAL
  Filled 2017-05-10 (×2): qty 2

## 2017-05-10 MED ORDER — TRAZODONE HCL 100 MG PO TABS
100.0000 mg | ORAL_TABLET | Freq: Every evening | ORAL | Status: DC | PRN
  Administered 2017-05-10 – 2017-05-15 (×4): 100 mg via ORAL
  Filled 2017-05-10 (×4): qty 1

## 2017-05-10 MED ORDER — ACYCLOVIR 200 MG PO CAPS
400.0000 mg | ORAL_CAPSULE | Freq: Two times a day (BID) | ORAL | Status: DC
  Administered 2017-05-10 – 2017-05-16 (×12): 400 mg via ORAL
  Filled 2017-05-10 (×13): qty 2

## 2017-05-10 MED ORDER — GABAPENTIN 600 MG PO TABS
ORAL_TABLET | ORAL | Status: AC
  Filled 2017-05-10: qty 1

## 2017-05-10 MED ORDER — GABAPENTIN 600 MG PO TABS
900.0000 mg | ORAL_TABLET | Freq: Once | ORAL | Status: AC
  Administered 2017-05-10: 900 mg via ORAL
  Filled 2017-05-10: qty 2

## 2017-05-10 MED ORDER — GABAPENTIN 400 MG PO CAPS
400.0000 mg | ORAL_CAPSULE | Freq: Three times a day (TID) | ORAL | Status: DC
  Administered 2017-05-11: 400 mg via ORAL
  Filled 2017-05-10: qty 1

## 2017-05-10 MED ORDER — HYDROXYZINE HCL 50 MG PO TABS: 50.0000 mg | ORAL_TABLET | Freq: Three times a day (TID) | ORAL | Status: DC | PRN

## 2017-05-10 MED ORDER — VENLAFAXINE HCL ER 75 MG PO CP24: 75.0000 mg | ORAL_CAPSULE | Freq: Every day | ORAL | Status: DC

## 2017-05-10 MED ORDER — ACYCLOVIR 200 MG PO CAPS
400.0000 mg | ORAL_CAPSULE | Freq: Two times a day (BID) | ORAL | Status: DC
  Administered 2017-05-10: 400 mg via ORAL
  Filled 2017-05-10: qty 2

## 2017-05-10 MED ORDER — LORAZEPAM 2 MG PO TABS: 0.0000 mg | ORAL_TABLET | Freq: Two times a day (BID) | ORAL | Status: DC

## 2017-05-10 MED ORDER — VENLAFAXINE HCL ER 75 MG PO CP24
75.0000 mg | ORAL_CAPSULE | Freq: Every day | ORAL | Status: DC
Start: 2017-05-11 — End: 2017-05-13
  Administered 2017-05-11 – 2017-05-13 (×3): 75 mg via ORAL
  Filled 2017-05-10 (×3): qty 1

## 2017-05-10 MED ORDER — VITAMIN B-1 100 MG PO TABS
100.0000 mg | ORAL_TABLET | Freq: Every day | ORAL | Status: DC
  Administered 2017-05-11 – 2017-05-16 (×6): 100 mg via ORAL
  Filled 2017-05-10 (×6): qty 1

## 2017-05-10 MED ORDER — DIVALPROEX SODIUM 500 MG PO DR TAB: 1000.0000 mg | DELAYED_RELEASE_TABLET | Freq: Two times a day (BID) | ORAL | Status: DC

## 2017-05-10 MED ORDER — ALUM & MAG HYDROXIDE-SIMETH 200-200-20 MG/5ML PO SUSP: 30.0000 mL | ORAL | Status: DC | PRN

## 2017-05-10 MED ORDER — ACETAMINOPHEN 325 MG PO TABS: 650.0000 mg | ORAL_TABLET | Freq: Four times a day (QID) | ORAL | Status: DC | PRN

## 2017-05-10 MED ORDER — LORAZEPAM 2 MG/ML IJ SOLN: 0.0000 mg | Freq: Two times a day (BID) | INTRAMUSCULAR | Status: DC

## 2017-05-10 MED ORDER — GABAPENTIN 300 MG PO CAPS
900.0000 mg | ORAL_CAPSULE | Freq: Once | ORAL | Status: AC
  Administered 2017-05-10: 900 mg via ORAL
  Filled 2017-05-10: qty 3

## 2017-05-10 MED ORDER — THIAMINE HCL 100 MG/ML IJ SOLN
100.0000 mg | Freq: Every day | INTRAMUSCULAR | Status: DC
  Filled 2017-05-10 (×6): qty 1

## 2017-05-10 NOTE — BH Assessment (Signed)
Patient is to be admitted to [facility] by Dr. Toni Amend.  Attending Physician will be Dr. Johnella Moloney.   Patient has been assigned to room 323, by [facility] Charge Nurse [place].   Intake Paper Work has been signed and placed on patient chart.  ER staff is aware of the admission:  Glenda: ER Sectary   Dr. Mayford Knife: ER MD   Gigi: Patient's Nurse   Mertie Clause: Patient Access.
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
## 2017-05-10 NOTE — ED Notes (Signed)
Report to include Situation, Background, Assessment, and Recommendations received from [place] RN. Patient alert and oriented, warm and dry, in no acute distress. Patient denies SI, AVH and pain. Patient states he wants to hurt "certain people". Patient made aware of Q15 minute rounds and security cameras for their safety. Patient instructed to come to me with needs or concerns.

## 2017-05-10 NOTE — BH Assessment (Addendum)
Per Dr. Clapac's patient meets criteria for inpatient psychiatric treatment. Pending bed assignment on BMU.  

## 2017-05-10 NOTE — Tx Team (Signed)
Initial Treatment Plan 05/10/2017 9:19 PM Kapil Loleta Chance [id number]    PATIENT STRESSORS: Financial difficulties Medication change or noncompliance Occupational concerns Substance abuse   PATIENT STRENGTHS: Average or above average intelligence Capable of independent living Motivation for treatment/growth Work skills   PATIENT IDENTIFIED PROBLEMS: Suicidal Risk    Illicit drug user    Depression / Anxiety    Homeless         DISCHARGE CRITERIA:  Improved stabilization in mood, thinking, and/or behavior Medical problems require only outpatient monitoring Reduction of life-threatening or endangering symptoms to within safe limits Verbal commitment to aftercare and medication compliance  PRELIMINARY DISCHARGE PLAN: Attend 12-step recovery group Outpatient therapy Participate in family therapy Placement in alternative living arrangements  PATIENT/FAMILY INVOLVEMENT: This treatment plan has been presented to and reviewed with the patient, Danny Lester,   The patient  have been given the opportunity to ask questions and make suggestions.  Lelan Pons, RN 05/10/2017, 9:19 PM

## 2017-05-10 NOTE — ED Notes (Signed)
Pt given dinner tray at this time, pt eating in room, NAD noted
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
## 2017-05-10 NOTE — ED Notes (Signed)
IVC/Plan to admit BMU/Moved to BHU-2 til room is ready

## 2017-05-10 NOTE — ED Provider Notes (Signed)
-----------------------------------------   6:27 AM on 05/10/2017 -----------------------------------------   Blood pressure 123/73, pulse 67, temperature 98 F (36.7 C), temperature source Oral, resp. rate 17, weight 73.9 kg (163 lb), SpO2 98 %.  The patient had no acute events since last update.  Calm and cooperative at this time.  Disposition is pending Psychiatry/Behavioral Medicine team recommendations.     Irean Hong, MD 05/10/17 [phone]

## 2017-05-10 NOTE — Consult Note (Signed)
[facility] Face-to-Face Psychiatry Consult   Reason for Consult: Consult for 42 year old man with a history of cocaine and alcohol abuse comes to the emergency room saying he is thinking of killing himself Referring Physician: Jimmye Norman Patient Identification: Danny Lester MRN:  [id number] Principal Diagnosis: Severe recurrent major depression without psychotic features [facility]) Diagnosis:   Patient Active Problem List   Diagnosis Date Noted  . Alcohol abuse [F10.10] 05/10/2017  . Severe recurrent major depression without psychotic features ([place]) [F33.2] 05/10/2017  . Cocaine abuse ([place]) [F14.10] 03/12/2017  . Adjustment disorder with depressed mood [F43.21] 01/28/2017  . Pelvic fracture ([facility]) [S32.9XXA] 01/02/2017  . Herpes simplex [B00.9] 02/11/2016  . Leukocytosis [D72.829] 12/01/2015  . Carpal tunnel syndrome, bilateral [G56.03] 09/28/2015  . Plantar fasciitis, bilateral [M72.2] 09/28/2015    Total Time spent with patient: 1 hour  Subjective:   Danny Lester is a 42 y.o. male patient admitted with "I just need to stop all this or I am going to slit my wrists".  HPI: Patient interviewed chart reviewed.  42 year old man came to the emergency room last night saying that he was having serious thoughts about killing himself by slitting his wrist.  He claims that he had a razor blade and was planning to cut his wrists open.  Patient says his mood feels hopeless sad and depressed all the time.  Has been worse for the past week or so.  Sleep is poor.  Chronic stress is his cocaine use which he knows is out of control.  Spends all of his money on cocaine not taking care of himself or his family.  Does not feel like he has a safe place to stay.  Feels angry a lot of the time.  Vague hallucinations but does not appear to be psychotic.  Also says he drinks a couple of 40 ounce beers a day.  Not receiving any mental health treatment.  Medical history: He says he has chronic back pain also appears to be on  acyclovir probably for herpes.  Social history: Sounds like he is sort of in between places having been staying in rental rooms but then spending all his money.  He is on probation.  Denies any new legal charges.  Substance abuse history: Long history of abuse of cocaine and alcohol.  Longest sobriety a few months but has little experience with any actual substance abuse treatment.  Says he has never been in any kind of rehab program.  No history of seizures or DTs.  Past Psychiatric History: Evidence in the chart of multiple visits to emergency rooms in [place] and elsewhere with similar symptoms.  Patient rarely is admitted to the hospital.  He is taking Effexor currently.  Denies that he has made serious attempts to kill himself in the past.  No evidence of psychotic disorder.  Risk to Self: Suicidal Ideation: Yes-Currently Present Suicidal Intent: Yes-Currently Present Is patient at risk for suicide?: Yes Suicidal Plan?: No-Not Currently/Within Last 6 Months Specify Current Suicidal Plan: Pt reports that he has recent thougths of wanting to cut himself with a razor Access to Means: Yes Specify Access to Suicidal Means: Razors What has been your use of drugs/alcohol within the last 12 months?: Cocaine, alcohol, and marijuana use How many times?: 1 Other Self Harm Risks: Current living arrangement unstable and pt does not want to return to it Triggers for Past Attempts: (Drug use) Intentional Self Injurious Behavior: Burning, Cutting Comment - Self Injurious Behavior: Pt reports to cutting and burning himself intentionally  in 2018 Risk to Others: Homicidal Ideation: No Thoughts of Harm to Others: No-Not Currently Present/Within Last 6 Months Comment - Thoughts of Harm to Others: Pt reports no current thoughts of harms to others, has in  past 6 months Current Homicidal Intent: No Current Homicidal Plan: No Access to Homicidal Means: No Describe Access to Homicidal Means: pt did not  report any access to means Identified Victim: none identified History of harm to others?: No Assessment of Violence: None Noted Violent Behavior Description: No current behaviors Does patient have access to weapons?: No Criminal Charges Pending?: Yes Describe Pending Criminal Charges: Pt recently indicted on several charges Does patient have a court date: No Prior Inpatient Therapy: Prior Inpatient Therapy: Yes Prior Therapy Dates: 2018 to present Prior Therapy Facilty/Provider(s): [facility] Reason for Treatment: depression Prior Outpatient Therapy: Prior Outpatient Therapy: Yes Prior Therapy Dates: current Prior Therapy Facilty/Provider(s): [facility] Reason for Treatment: depression Does patient have an ACCT team?: No Does patient have Intensive In-House Services?  : No Does patient have [facility] services? : Yes Does patient have P4CC services?: No  Past Medical History:  Past Medical History:  Diagnosis Date  . Anxiety   . Broken arm   . Depression   . Multiple personality disorder ([place])   . Nerve pain   . PTSD (post-traumatic stress disorder)   . Schizophrenia, paranoid, chronic ([place])     Past Surgical History:  Procedure Laterality Date  . KNEE SURGERY     Family History: History reviewed. No pertinent family history. Family Psychiatric  History: Positive for substance abuse and several male family members Social History:  Social History   Substance and Sexual Activity  Alcohol Use Yes   Comment: beer occasionally     Social History   Substance and Sexual Activity  Drug Use Yes  . Types: Marijuana, Cocaine   Comment: Pt stated "I have a long hx of drug abuse."    Social History   Socioeconomic History  . Marital status: Single    Spouse name: Not on file  . Number of children: Not on file  . Years of education: Not on file  . Highest education level: Not on file  Occupational History  . Not on file  Social Needs  . Financial resource strain: Not on file  .  Food insecurity:    Worry: Not on file    Inability: Not on file  . Transportation needs:    Medical: Not on file    Non-medical: Not on file  Tobacco Use  . Smoking status: Current Some Day Smoker    Packs/day: 0.30    Types: Cigarettes  . Smokeless tobacco: Never Used  Substance and Sexual Activity  . Alcohol use: Yes    Comment: beer occasionally  . Drug use: Yes    Types: Marijuana, Cocaine    Comment: Pt stated "I have a long hx of drug abuse."  . Sexual activity: Yes    Birth control/protection: None  Lifestyle  . Physical activity:    Days per week: Not on file    Minutes per session: Not on file  . Stress: Not on file  Relationships  . Social connections:    Talks on phone: Not on file    Gets together: Not on file    Attends religious service: Not on file    Active member of club or organization: Not on file    Attends meetings of clubs or organizations: Not on file    Relationship  status: Not on file  Other Topics Concern  . Not on file  Social History Narrative  . Not on file   Additional Social History:    Allergies:   Allergies  Allergen Reactions  . Penicillins Hives and Swelling    Swelling of face Has patient had a PCN reaction causing immediate rash, facial/tongue/throat swelling, SOB or lightheadedness with hypotension: Yes Has patient had a PCN reaction causing severe rash involving mucus membranes or skin necrosis: Yes Has patient had a PCN reaction that required hospitalization No Has patient had a PCN reaction occurring within the last 10 years: No If all of the above answers are "NO", then may proceed with Cephalosporin use.   . Pork-Derived Products   . Risperdal [Risperidone] Other (See Comments)    Per pt: "starts shaking on one side"    Labs:  Results for orders placed or performed during the hospital encounter of 05/09/17 (from the past 48 hour(s))  Comprehensive metabolic panel     Status: Abnormal   Collection Time: 05/09/17   8:15 PM  Result Value Ref Range   Sodium 137 135 - 145 mmol/L   Potassium 3.4 (L) 3.5 - 5.1 mmol/L   Chloride 104 101 - 111 mmol/L   CO2 25 22 - 32 mmol/L   Glucose, Bld 99 65 - 99 mg/dL   BUN 8 6 - 20 mg/dL   Creatinine, Ser 0.81 0.61 - 1.24 mg/dL   Calcium 9.2 8.9 - 10.3 mg/dL   Total Protein 8.2 (H) 6.5 - 8.1 g/dL   Albumin 4.4 3.5 - 5.0 g/dL   AST 23 15 - 41 U/L   ALT 15 (L) 17 - 63 U/L   Alkaline Phosphatase 97 38 - 126 U/L   Total Bilirubin 1.3 (H) 0.3 - 1.2 mg/dL   GFR calc non Af Amer >60 >60 mL/min   GFR calc Af Amer >60 >60 mL/min    Comment: (NOTE) The eGFR has been calculated using the CKD EPI equation. This calculation has not been validated in all clinical situations. eGFR's persistently <60 mL/min signify possible Chronic Kidney Disease.    Anion gap 8 5 - 15    Comment: Performed at [facility], [address] [place], [place] [address]  Ethanol     Status: None   Collection Time: 05/09/17  8:15 PM  Result Value Ref Range   Alcohol, Ethyl (B) <10 <10 mg/dL    Comment:        LOWEST DETECTABLE LIMIT FOR SERUM ALCOHOL IS 10 mg/dL FOR MEDICAL PURPOSES ONLY Performed at [facility], [address] [place], [place] [address]   Salicylate level     Status: None   Collection Time: 05/09/17  8:15 PM  Result Value Ref Range   Salicylate Lvl <6.4 2.8 - 30.0 mg/dL    Comment: Performed at [facility], [address] [place], [place] [address]  Acetaminophen level     Status: Abnormal   Collection Time: 05/09/17  8:15 PM  Result Value Ref Range   Acetaminophen (Tylenol), Serum <10 (L) 10 - 30 ug/mL    Comment:        THERAPEUTIC CONCENTRATIONS VARY SIGNIFICANTLY. A RANGE OF 10-30 ug/mL MAY BE AN EFFECTIVE CONCENTRATION FOR MANY PATIENTS. HOWEVER, SOME ARE BEST TREATED AT CONCENTRATIONS OUTSIDE THIS RANGE. ACETAMINOPHEN CONCENTRATIONS >150 ug/mL AT 4 HOURS AFTER INGESTION AND >50 ug/mL AT 12 HOURS AFTER INGESTION  ARE OFTEN ASSOCIATED WITH TOXIC REACTIONS. Performed at [facility],  [place], [place] [address]   cbc     Status: Abnormal   Collection Time: 05/09/17  8:15 PM  Result Value Ref Range   WBC 14.6 (H) 3.8 - 10.6 K/uL   RBC 5.27 4.40 - 5.90 MIL/uL   Hemoglobin 15.4 13.0 - 18.0 g/dL   HCT 45.5 40.0 - 52.0 %   MCV 86.3 80.0 - 100.0 fL   MCH 29.3 26.0 - 34.0 pg   MCHC 33.9 32.0 - 36.0 g/dL   RDW 15.6 (H) 11.5 - 14.5 %   Platelets 250 150 - 440 K/uL    Comment: Performed at [facility], [address], [place], [place] [address]  Urine Drug Screen, Qualitative     Status: Abnormal   Collection Time: 05/09/17  8:15 PM  Result Value Ref Range   Tricyclic, Ur Screen NONE DETECTED NONE DETECTED   Amphetamines, Ur Screen NONE DETECTED NONE DETECTED   MDMA (Ecstasy)Ur Screen NONE DETECTED NONE DETECTED   Cocaine Metabolite,Ur [place] POSITIVE (A) NONE DETECTED   Opiate, Ur Screen NONE DETECTED NONE DETECTED   Phencyclidine (PCP) Ur S NONE DETECTED NONE DETECTED   Cannabinoid 50 Ng, Ur [place] NONE DETECTED NONE DETECTED   Barbiturates, Ur Screen NONE DETECTED NONE DETECTED   Benzodiazepine, Ur Scrn NONE DETECTED NONE DETECTED   Methadone Scn, Ur NONE DETECTED NONE DETECTED    Comment: (NOTE) Tricyclics + metabolites, urine    Cutoff 1000 ng/mL Amphetamines + metabolites, urine  Cutoff 1000 ng/mL MDMA (Ecstasy), urine              Cutoff 500 ng/mL Cocaine Metabolite, urine          Cutoff 300 ng/mL Opiate + metabolites, urine        Cutoff 300 ng/mL Phencyclidine (PCP), urine         Cutoff 25 ng/mL Cannabinoid, urine                 Cutoff 50 ng/mL Barbiturates + metabolites, urine  Cutoff 200 ng/mL Benzodiazepine, urine              Cutoff 200 ng/mL Methadone, urine                   Cutoff 300 ng/mL The urine drug screen provides only a preliminary, unconfirmed analytical test result and should not be used for non-medical purposes. Clinical consideration  and professional judgment should be applied to any positive drug screen result due to possible interfering substances. A more specific alternate chemical method must be used in order to obtain a confirmed analytical result. Gas chromatography / mass spectrometry (GC/MS) is the preferred confirmat ory method. Performed at [facility], [address], [place], [place] [address]     Current Facility-Administered Medications  Medication Dose Route Frequency Provider Last Rate Last Dose  . divalproex (DEPAKOTE) DR tablet 500 mg  500 mg Oral Q12H Orbie Pyo, MD   500 mg at 05/10/17 1657  . hydrOXYzine (ATARAX/VISTARIL) tablet 50 mg  50 mg Oral BID Orbie Pyo, MD   50 mg at 05/10/17 0949  . LORazepam (ATIVAN) injection 0-4 mg  0-4 mg Intravenous Q6H Schaevitz, Randall An, MD       Or  . LORazepam (ATIVAN) tablet 0-4 mg  0-4 mg Oral Q6H Schaevitz, Randall An, MD   1 mg at 05/10/17 1000  . [START ON 05/12/2017] LORazepam (ATIVAN) injection 0-4 mg  0-4 mg Intravenous Q12H Schaevitz, Randall An,  MD       Or  . Derrill Memo ON 05/12/2017] LORazepam (ATIVAN) tablet 0-4 mg  0-4 mg Oral Q12H Schaevitz, Randall An, MD      . thiamine (VITAMIN B-1) tablet 100 mg  100 mg Oral Daily Orbie Pyo, MD   100 mg at 05/10/17 4680   Or  . thiamine (B-1) injection 100 mg  100 mg Intravenous Daily Orbie Pyo, MD       Current Outpatient Medications  Medication Sig Dispense Refill  . acyclovir (ZOVIRAX) 400 MG tablet 1 tab by mouth twice daily (Patient not taking: Reported on 03/14/2017) 60 tablet 11  . baclofen (LIORESAL) 10 MG tablet 1/2 tab by mouth twice daily only as needed (Patient not taking: Reported on 03/14/2017) 30 each 3  . divalproex (DEPAKOTE) 500 MG DR tablet Take 500-1,000 mg by mouth 2 (two) times daily. Take 2 tablets every morning and take 1 tablet every evening    . gabapentin (NEURONTIN) 600 MG tablet Take 1 tablet (600 mg total)  by mouth 3 (three) times daily for 7 days. 21 tablet 0  . hydrOXYzine (ATARAX/VISTARIL) 25 MG tablet Take 25 mg by mouth 3 (three) times daily as needed for anxiety.     . Lurasidone HCl (LATUDA) 120 MG TABS Take 120 mg by mouth at bedtime.     . traZODone (DESYREL) 150 MG tablet Take 150 mg by mouth at bedtime as needed for sleep.     Marland Kitchen venlafaxine XR (EFFEXOR-XR) 75 MG 24 hr capsule Take 75 mg by mouth every morning.      Musculoskeletal: Strength & Muscle Tone: within normal limits Gait & Station: normal Patient leans: N/A  Psychiatric Specialty Exam: Physical Exam  Nursing note and vitals reviewed. Constitutional: He appears well-developed and well-nourished.  HENT:  Head: Normocephalic and atraumatic.  Eyes: Pupils are equal, round, and reactive to light. Conjunctivae are normal.  Neck: Normal range of motion.  Cardiovascular: Regular rhythm and normal heart sounds.  Respiratory: Effort normal. No respiratory distress.  GI: Soft.  Musculoskeletal: Normal range of motion.  Neurological: He is alert.  Skin: Skin is warm and dry.  Psychiatric: His speech is delayed. He is slowed and withdrawn. Cognition and memory are normal. He expresses impulsivity. He exhibits a depressed mood. He expresses suicidal ideation. He expresses suicidal plans.    Review of Systems  Constitutional: Negative.   HENT: Negative.   Eyes: Negative.   Respiratory: Negative.   Cardiovascular: Negative.   Gastrointestinal: Negative.   Musculoskeletal: Negative.   Skin: Negative.   Neurological: Negative.   Psychiatric/Behavioral: Positive for depression, substance abuse and suicidal ideas. Negative for hallucinations and memory loss. The patient is nervous/anxious and has insomnia.     Blood pressure 123/73, pulse 67, temperature 98 F (36.7 C), temperature source Oral, resp. rate 17, weight 73.9 kg (163 lb), SpO2 98 %.Body mass index is 25.53 kg/m.  General Appearance: Casual  Eye Contact:  Fair   Speech:  Slow  Volume:  Decreased  Mood:  Depressed and Dysphoric  Affect:  Constricted  Thought Process:  Disorganized  Orientation:  Full (Time, Place, and Person)  Thought Content:  Logical  Suicidal Thoughts:  Yes.  with intent/plan  Homicidal Thoughts:  No  Memory:  Immediate;   Fair Recent;   Fair Remote;   Fair  Judgement:  Fair  Insight:  Fair  Psychomotor Activity:  Decreased  Concentration:  Concentration: Fair  Recall:  [employer] of Knowledge:  Fair  Language:  Fair  Akathisia:  No  Handed:  Right  AIMS (if indicated):     Assets:  Desire for Improvement Physical Health Resilience  ADL's:  Intact  Cognition:  WNL  Sleep:        Treatment Plan Summary: Daily contact with patient to assess and evaluate symptoms and progress in treatment, Medication management and Plan 42 year old man with a history of substance abuse and depression reporting suicidal thoughts.  Patient is calm appropriate and appears to be lucid during the conversation.  Physically stable.  Vitals fine.  Patient states that he feels that things are spiraling out of control if he does not get help.  He is already under IVC.  Plan will be for admission to the psychiatry ward diagnosis major depression continue outpatient psychiatric medicine.  Of labs to be obtained social work and treatment team can work with the patient on disposition.  Disposition: Recommend psychiatric Inpatient admission when medically cleared. Supportive therapy provided about ongoing stressors.  Alethia Berthold, MD 05/10/2017 1:44 PM

## 2017-05-11 ENCOUNTER — Ambulatory Visit: Payer: Medicaid Other | Admitting: Internal Medicine

## 2017-05-11 DIAGNOSIS — F332 Major depressive disorder, recurrent severe without psychotic features: Principal | ICD-10-CM

## 2017-05-11 LAB — LIPID PANEL
Cholesterol: 163 mg/dL (ref 0–200)
HDL: 59 mg/dL (ref >40)
LDL CALC: 96 mg/dL (ref 0–99)
TRIGLYCERIDES: 41 mg/dL (ref <150)
Total CHOL/HDL Ratio: 2.8 RATIO
VLDL: 8 mg/dL (ref 0–40)

## 2017-05-11 LAB — HEMOGLOBIN A1C
Hgb A1c MFr Bld: 5.8 % — ABNORMAL HIGH (ref 4.8–5.6)
Mean Plasma Glucose: 119.76 mg/dL

## 2017-05-11 LAB — TSH: TSH: 0.854 u[IU]/mL (ref 0.350–4.500)

## 2017-05-11 MED ORDER — ADULT MULTIVITAMIN W/MINERALS CH
1.0000 | ORAL_TABLET | Freq: Every day | ORAL | Status: DC
  Administered 2017-05-11 – 2017-05-16 (×6): 1 via ORAL
  Filled 2017-05-11 (×6): qty 1

## 2017-05-11 MED ORDER — ONDANSETRON 4 MG PO TBDP: 4.0000 mg | ORAL_TABLET | Freq: Four times a day (QID) | ORAL | Status: DC | PRN

## 2017-05-11 MED ORDER — LOPERAMIDE HCL 2 MG PO CAPS: 2.0000 mg | ORAL_CAPSULE | ORAL | Status: DC | PRN

## 2017-05-11 MED ORDER — DIVALPROEX SODIUM 500 MG PO DR TAB
500.0000 mg | DELAYED_RELEASE_TABLET | Freq: Two times a day (BID) | ORAL | Status: DC
  Administered 2017-05-11 – 2017-05-16 (×10): 500 mg via ORAL
  Filled 2017-05-11 (×10): qty 1

## 2017-05-11 MED ORDER — VITAMIN B-1 100 MG PO TABS: 100.0000 mg | ORAL_TABLET | Freq: Every day | ORAL | Status: DC

## 2017-05-11 MED ORDER — CHLORDIAZEPOXIDE HCL 25 MG PO CAPS: 25.0000 mg | ORAL_CAPSULE | Freq: Four times a day (QID) | ORAL | Status: DC | PRN

## 2017-05-11 MED ORDER — GABAPENTIN 300 MG PO CAPS
900.0000 mg | ORAL_CAPSULE | Freq: Three times a day (TID) | ORAL | Status: DC
  Administered 2017-05-11 – 2017-05-16 (×15): 900 mg via ORAL
  Filled 2017-05-11 (×15): qty 3

## 2017-05-11 MED ORDER — LURASIDONE HCL 80 MG PO TABS
80.0000 mg | ORAL_TABLET | Freq: Every day | ORAL | Status: DC
  Administered 2017-05-11 – 2017-05-12 (×2): 80 mg via ORAL
  Filled 2017-05-11 (×3): qty 1

## 2017-05-11 MED ORDER — HYDROXYZINE HCL 25 MG PO TABS: 25.0000 mg | ORAL_TABLET | Freq: Four times a day (QID) | ORAL | Status: DC | PRN

## 2017-05-11 NOTE — Tx Team (Addendum)
Interdisciplinary Treatment and Diagnostic Plan Update  05/11/2017 Time of Session: 11:08am Danny Lester MRN: [id number]  Principal Diagnosis: <principal problem not specified>  Secondary Diagnoses: Active Problems:   Severe recurrent major depression without psychotic features (HCC)   Current Medications:  Current Facility-Administered Medications  Medication Dose Route Frequency Provider Last Rate Last Dose  . acetaminophen (TYLENOL) tablet 650 mg  650 mg Oral Q6H PRN Clapacs, John T, MD      . acyclovir (ZOVIRAX) 200 MG capsule 400 mg  400 mg Oral BID Clapacs, Jackquline Denmark, MD   400 mg at 05/11/17 0758  . alum & mag hydroxide-simeth (MAALOX/MYLANTA) 200-200-20 MG/5ML suspension 30 mL  30 mL Oral Q4H PRN Clapacs, John T, MD      . chlordiazePOXIDE (LIBRIUM) capsule 25 mg  25 mg Oral Q6H PRN McNew, Holly R, MD      . divalproex (DEPAKOTE) DR tablet 1,000 mg  1,000 mg Oral Q12H Clapacs, Jackquline Denmark, MD   1,000 mg at 05/11/17 0758  . gabapentin (NEURONTIN) capsule 900 mg  900 mg Oral TID McNew, Ileene Hutchinson, MD      . hydrOXYzine (ATARAX/VISTARIL) tablet 50 mg  50 mg Oral TID PRN Clapacs, John T, MD      . hydrOXYzine (ATARAX/VISTARIL) tablet 50 mg  50 mg Oral BID Clapacs, Jackquline Denmark, MD   50 mg at 05/11/17 0758  . loperamide (IMODIUM) capsule 2-4 mg  2-4 mg Oral PRN McNew, Ileene Hutchinson, MD      . lurasidone (LATUDA) tablet 80 mg  80 mg Oral QHS McNew, Holly R, MD      . magnesium hydroxide (MILK OF MAGNESIA) suspension 30 mL  30 mL Oral Daily PRN Clapacs, John T, MD      . multivitamin with minerals tablet 1 tablet  1 tablet Oral Daily McNew, Holly R, MD      . ondansetron (ZOFRAN-ODT) disintegrating tablet 4 mg  4 mg Oral Q6H PRN McNew, Holly R, MD      . thiamine (VITAMIN B-1) tablet 100 mg  100 mg Oral Daily Clapacs, John T, MD   100 mg at 05/11/17 4098   Or  . thiamine (B-1) injection 100 mg  100 mg Intravenous Daily Clapacs, John T, MD      . traZODone (DESYREL) tablet 100 mg  100 mg Oral QHS PRN Clapacs,  Jackquline Denmark, MD   100 mg at 05/10/17 2145  . venlafaxine XR (EFFEXOR-XR) 24 hr capsule 75 mg  75 mg Oral Q breakfast Clapacs, Jackquline Denmark, MD   75 mg at 05/11/17 1191   PTA Medications: Medications Prior to Admission  Medication Sig Dispense Refill Last Dose  . acyclovir (ZOVIRAX) 400 MG tablet 1 tab by mouth twice daily (Patient not taking: Reported on 03/14/2017) 60 tablet 11 Completed Course at Unknown time  . baclofen (LIORESAL) 10 MG tablet 1/2 tab by mouth twice daily only as needed (Patient not taking: Reported on 03/14/2017) 30 each 3 Completed Course at Unknown time  . divalproex (DEPAKOTE) 500 MG DR tablet Take 500-1,000 mg by mouth 2 (two) times daily. Take 2 tablets every morning and take 1 tablet every evening   Past Week at Unknown time  . gabapentin (NEURONTIN) 600 MG tablet Take 1 tablet (600 mg total) by mouth 3 (three) times daily for 7 days. 21 tablet 0   . hydrOXYzine (ATARAX/VISTARIL) 25 MG tablet Take 25 mg by mouth 3 (three) times daily as needed for anxiety.    Past  Week at Unknown time  . Lurasidone HCl (LATUDA) 120 MG TABS Take 120 mg by mouth at bedtime.    Past Week at Unknown time  . traZODone (DESYREL) 150 MG tablet Take 150 mg by mouth at bedtime as needed for sleep.    Past Week at Unknown time  . venlafaxine XR (EFFEXOR-XR) 75 MG 24 hr capsule Take 75 mg by mouth every morning.   Past Week at Unknown time    Patient Stressors: Financial difficulties Medication change or noncompliance Occupational concerns Substance abuse  Patient Strengths: Average or above average intelligence Capable of independent living Motivation for treatment/growth Work skills  Treatment Modalities: Medication Management, Group therapy, Case management,  1 to 1 session with clinician, Psychoeducation, Recreational therapy.   Physician Treatment Plan for Primary Diagnosis: <principal problem not specified> Long Term Goal(s):     Short Term Goals:    Medication Management: Evaluate patient's  response, side effects, and tolerance of medication regimen.  Therapeutic Interventions: 1 to 1 sessions, Unit Group sessions and Medication administration.  Evaluation of Outcomes: Progressing  Physician Treatment Plan for Secondary Diagnosis: Active Problems:   Severe recurrent major depression without psychotic features (HCC)  Long Term Goal(s):     Short Term Goals:       Medication Management: Evaluate patient's response, side effects, and tolerance of medication regimen.  Therapeutic Interventions: 1 to 1 sessions, Unit Group sessions and Medication administration.  Evaluation of Outcomes: Progressing   RN Treatment Plan for Primary Diagnosis: <principal problem not specified> Long Term Goal(s): Knowledge of disease and therapeutic regimen to maintain health will improve  Short Term Goals: Ability to verbalize feelings will improve, Ability to identify and develop effective coping behaviors will improve and Compliance with prescribed medications will improve  Medication Management: RN will administer medications as ordered by provider, will assess and evaluate patient's response and provide education to patient for prescribed medication. RN will report any adverse and/or side effects to prescribing provider.  Therapeutic Interventions: 1 on 1 counseling sessions, Psychoeducation, Medication administration, Evaluate responses to treatment, Monitor vital signs and CBGs as ordered, Perform/monitor CIWA, COWS, AIMS and Fall Risk screenings as ordered, Perform wound care treatments as ordered.  Evaluation of Outcomes: Progressing   LCSW Treatment Plan for Primary Diagnosis: <principal problem not specified> Long Term Goal(s): Safe transition to appropriate next level of care at discharge, Engage patient in therapeutic group addressing interpersonal concerns.  Short Term Goals: Engage patient in aftercare planning with referrals and resources, Facilitate patient progression through  stages of change regarding substance use diagnoses and concerns, Identify triggers associated with mental health/substance abuse issues and Increase skills for wellness and recovery  Therapeutic Interventions: Assess for all discharge needs, 1 to 1 time with Social worker, Explore available resources and support systems, Assess for adequacy in community support network, Educate family and significant other(s) on suicide prevention, Complete Psychosocial Assessment, Interpersonal group therapy.  Evaluation of Outcomes: Progressing   Progress in Treatment: Attending groups: No. Participating in groups: No. Taking medication as prescribed: Yes. Toleration medication: Yes. Family/Significant other contact made: No, will contact:  Patient refused Patient understands diagnosis: Yes. Discussing patient identified problems/goals with staff: Yes. Medical problems stabilized or resolved: Yes. Denies suicidal/homicidal ideation: Yes. Issues/concerns per patient self-inventory: No. Other:   New problem(s) identified: No, Describe:  None  New Short Term/Long Term Goal(s): "To get clean and stay clean and to get reacquainted with my family."  Discharge Plan or Barriers: To enter into a substance abuse  rehab program.  Reason for Continuation of Hospitalization: Depression Medication stabilization Suicidal ideation  Estimated Length of Stay: 3-5 days  Recreational Therapy: Patient Stressors: N/A Patient Goal: Patient will identify 3 positive coping skills to decrease depressive symptoms within 5 recreation therapy group sessions  Attendees: Patient: Danny Lester 05/11/2017 11:16 AM  Physician: Corinna Gab, MD 05/11/2017 11:16 AM  Nursing:  05/11/2017 11:16 AM  RN Care Manager: 05/11/2017 11:16 AM  Social Worker: [name], LCSWA 05/11/2017 11:16 AM  Recreational Therapist: Danella Deis. Dreama Saa, LRT 05/11/2017 11:16 AM  Other: Heidi Dach, LCSW 05/11/2017 11:16 AM  Other: Huey Romans, LCSW  05/11/2017 11:16 AM  Other: 05/11/2017 11:16 AM    Scribe for Treatment Team: [name], LCSW 05/11/2017 11:16 AM
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
## 2017-05-11 NOTE — Plan of Care (Signed)
Pt. During assessment this evening denies SI/Hi and reports no plan to hurt himself. Pt. Asked if he can remain safe while on the unit and verbally contracts for safety and verbalizes he can remain safe. Pt. Reports doing, "good" this evening. Pt. Isolative frequently this evening, but does participate in snacks this evening with peers. Pt. Verbalizes understanding of provided education. Pt. Reports eating well. Pt. Engages calm and cooperatively.    Problem: Self-Concept: Goal: Ability to disclose and discuss suicidal ideas will improve Outcome: Progressing Goal: Will verbalize positive feelings about self Outcome: Progressing   Problem: Safety: Goal: Ability to remain free from injury will improve Outcome: Progressing   Problem: Activity: Goal: Interest or engagement in leisure activities will improve Outcome: Progressing   Problem: Education: Goal: Knowledge of General Education information will improve Outcome: Progressing   Problem: Nutrition: Goal: Adequate nutrition will be maintained Outcome: Progressing   Problem: Safety: Goal: Ability to remain free from injury will improve Outcome: Progressing

## 2017-05-11 NOTE — Progress Notes (Signed)
Recreation Therapy Notes  INPATIENT RECREATION THERAPY ASSESSMENT  Patient Details Name: Danny Lester MRN: [id number] DOB: November 12, 1975 Today's Date: 05/11/2017       Information Obtained From: Patient  Able to Participate in Assessment/Interview: Yes  Patient Presentation: Responsive  Reason for Admission (Per Patient): Suicidal Ideation, Substance Abuse, Other (Comments)(Depression)  Patient Stressors:    Coping Skills:   Music, Talk  Leisure Interests (2+):  Music - Listen  Frequency of Recreation/Participation: Weekly  Awareness of Community Resources:     [facility]:     Current Use:    If no, Barriers?:    Expressed Interest in [employer] Information:    [place] of Residence:  [place]  Patient Main Form of Transportation: Walk  Patient Strengths:  Loyal, Psychologist, forensic, Kind  Patient Identified Areas of Improvement:  Stay away from drugs and change my attitude  Patient Goal for Hospitalization:  To get clean and stay clean  Current SI (including self-harm):  No  Current HI:  No  Current AVH: No  Staff Intervention Plan: Group Attendance, Collaborate with Interdisciplinary Treatment Team  Consent to Intern Participation: N/A  Shantal Roan 05/11/2017, 2:26 PM

## 2017-05-11 NOTE — [facility] Suicide Risk Assessment (Signed)
[facility] Admission Suicide Risk Assessment   Nursing information obtained from:  Patient Demographic factors:  Low socioeconomic status Current Mental Status:  Suicidal ideation indicated by patient Loss Factors:  Financial problems / change in socioeconomic status Historical Factors:  NA Risk Reduction Factors:  Positive therapeutic relationship  Total Time spent with patient: 45 minutes Principal Problem: Severe recurrent major depression without psychotic features (HCC) Diagnosis:   Patient Active Problem List   Diagnosis Date Noted  . Severe recurrent major depression without psychotic features (HCC) [F33.2] 05/10/2017    Priority: High  . Alcohol abuse [F10.10] 05/10/2017  . Cocaine abuse (HCC) [F14.10] 03/12/2017  . Adjustment disorder with depressed mood [F43.21] 01/28/2017  . Pelvic fracture (HCC) [S32.9XXA] 01/02/2017  . Herpes simplex [B00.9] 02/11/2016  . Leukocytosis [D72.829] 12/01/2015  . Carpal tunnel syndrome, bilateral [G56.03] 09/28/2015  . Plantar fasciitis, bilateral [M72.2] 09/28/2015   Subjective Data: See h&P  Continued Clinical Symptoms:  Alcohol Use Disorder Identification Test Final Score (AUDIT): 11 The "Alcohol Use Disorders Identification Test", Guidelines for Use in Primary Care, Second Edition.  World Science writer [facility]). Score between 0-7:  no or low risk or alcohol related problems. Score between 8-15:  moderate risk of alcohol related problems. Score between 16-19:  high risk of alcohol related problems. Score 20 or above:  warrants further diagnostic evaluation for alcohol dependence and treatment.   CLINICAL FACTORS:   Alcohol/Substance Abuse/Dependencies   COGNITIVE FEATURES THAT CONTRIBUTE TO RISK:  None    SUICIDE RISK:   Moderate:  Frequent suicidal ideation with limited intensity, and duration, some specificity in terms of plans, no associated intent, good self-control, limited dysphoria/symptomatology, some risk factors present,  and identifiable protective factors, including available and accessible social support.  PLAN OF CARE: See H&P  I certify that inpatient services furnished can reasonably be expected to improve the patient's condition.   Haskell Riling, MD 05/11/2017, 2:42 PM

## 2017-05-11 NOTE — [facility] Group Notes (Signed)
05/11/2017 1PM  Type of Therapy and Topic:  Group Therapy:  Feelings around Relapse and Recovery  Participation Level:  Did Not Attend   Description of Group:    Patients in this group will discuss emotions they experience before and after a relapse. They will process how experiencing these feelings, or avoidance of experiencing them, relates to having a relapse. Facilitator will guide patients to explore emotions they have related to recovery. Patients will be encouraged to process which emotions are more powerful. They will be guided to discuss the emotional reaction significant others in their lives may have to patients' relapse or recovery. Patients will be assisted in exploring ways to respond to the emotions of others without this contributing to a relapse.  Therapeutic Goals: 1. Patient will identify two or more emotions that lead to a relapse for them 2. Patient will identify two emotions that result when they relapse 3. Patient will identify two emotions related to recovery 4. Patient will demonstrate ability to communicate their needs through discussion and/or role plays   Summary of Patient Progress: Patient was encouraged and invited to attend group. Patient did not attend group. Social worker will continue to encourage group participation in the future.      Therapeutic Modalities:   Cognitive Behavioral Therapy Solution-Focused Therapy Assertiveness Training Relapse Prevention Therapy   Brynja Marker, LCSW 05/11/2017 1:30 PM    

## 2017-05-11 NOTE — Plan of Care (Signed)
Patient sad and depressed.Patient was irritable this morning about his medications.Isolated in the room with minimal interactions with staff & peers.Compliant with medications.Attended some groups.Appetite and energy level good.Support and encouragement given.

## 2017-05-11 NOTE — [facility] Suicide Risk Assessment (Signed)
[facility] INPATIENT:  Family/Significant Other Suicide Prevention Education  Suicide Prevention Education:  Patient Refusal for Family/Significant Other Suicide Prevention Education: The patient Danny Lester has refused to provide written consent for family/significant other to be provided Family/Significant Other Suicide Prevention Education during admission and/or prior to discharge.  Physician notified.  [name] 05/11/2017, 9:56 AM
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
## 2017-05-11 NOTE — Progress Notes (Signed)
Recreation Therapy Notes  Date: 05/11/2017  Time: 9:30 am   Location: Craft Room   Behavioral response: N/A   Intervention Topic: Leisure  Discussion/Intervention: Patient did not attend group.   Clinical Observations/Feedback:  Patient did not attend group.   Eryck Negron LRT/CTRS         Harce Volden 05/11/2017 10:27 AM 

## 2017-05-12 LAB — VALPROIC ACID LEVEL: VALPROIC ACID LVL: 105 ug/mL — AB (ref 50.0–100.0)

## 2017-05-12 MED ORDER — BACLOFEN 10 MG PO TABS
10.0000 mg | ORAL_TABLET | Freq: Two times a day (BID) | ORAL | Status: DC | PRN
  Administered 2017-05-13 – 2017-05-16 (×6): 10 mg via ORAL
  Filled 2017-05-12 (×6): qty 1

## 2017-05-12 NOTE — [facility] Group Notes (Signed)
LCSW Group Therapy Note  05/12/2017 1:15pm  Type of Therapy and Topic: Group Therapy: Holding on to Grudges   Participation Level: Did Not Attend   Description of Group:  In this group patients will be asked to explore and define a grudge. Patients will be guided to discuss their thoughts, feelings, and reasons as to why people have grudges. Patients will process the impact grudges have on daily life and identify thoughts and feelings related to holding grudges. Facilitator will challenge patients to identify ways to let go of grudges and the benefits this provides. Patients will be confronted to address why one struggles letting go of grudges. Lastly, patients will identify feelings and thoughts related to what life would look like without grudges. This group will be process-oriented, with patients participating in exploration of their own experiences, giving and receiving support, and processing challenge from other group members.  Therapeutic Goals:  1. Patient will identify specific grudges related to their personal life.  2. Patient will identify feelings, thoughts, and beliefs around grudges.  3. Patient will identify how one releases grudges appropriately.  4. Patient will identify situations where they could have let go of the grudge, but instead chose to hold on.   Summary of Patient Progress: Pt was invited to group but did not attend.  Therapeutic Modalities:  Cognitive Behavioral Therapy  Solution Focused Therapy  Motivational Interviewing  Brief Therapy   Danny Lester  CUEBAS-COLON, LCSW 05/12/2017 12:50 PM

## 2017-05-12 NOTE — Progress Notes (Signed)
[facility] MD Progress Note  05/12/2017 1:07 PM Danny Lester  MRN:  [id number] Subjective:  Back hurts, can I get baclofen?" Pt continue to endorse depression and intermittent SI, but contracts for safety. Pt not scoring on CIWA, asking baclofen for back pain.   Principal Problem: Severe recurrent major depression without psychotic features (HCC) Diagnosis:   Patient Active Problem List   Diagnosis Date Noted  . Alcohol abuse [F10.10] 05/10/2017  . Severe recurrent major depression without psychotic features (HCC) [F33.2] 05/10/2017  . Cocaine abuse (HCC) [F14.10] 03/12/2017  . Adjustment disorder with depressed mood [F43.21] 01/28/2017  . Pelvic fracture (HCC) [S32.9XXA] 01/02/2017  . Herpes simplex [B00.9] 02/11/2016  . Leukocytosis [D72.829] 12/01/2015  . Carpal tunnel syndrome, bilateral [G56.03] 09/28/2015  . Plantar fasciitis, bilateral [M72.2] 09/28/2015   Total Time spent with patient: 30 minutes  Past Psychiatric History: no new info  Past Medical History:  Past Medical History:  Diagnosis Date  . Anxiety   . Broken arm   . Depression   . 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 Nerve pain   . PTSD (post-traumatic stress disorder)   . Schizophrenia, paranoid, chronic (HCC)     Past Surgical History:  Procedure Laterality Date  . KNEE SURGERY     Family History: History reviewed. No pertinent family history. Family Psychiatric  History: no new info Social History:  Social History   Substance and Sexual Activity  Alcohol Use Yes   Comment: beer occasionally     Social History   Substance and Sexual Activity  Drug Use Yes  . Types: Marijuana, Cocaine   Comment: Pt stated "I have a long hx of drug abuse."    Social History   Socioeconomic History  . Marital status: Single    Spouse name: Not on file  . Number of children: Not on file  . Years of education: Not on file  . Highest education level: Not on file  Occupational History  . Not on file  Social  Needs  . Financial resource strain: Not on file  . Food insecurity:    Worry: Not on file    Inability: Not on file  . Transportation needs:    Medical: Not on file    Non-medical: Not on file  Tobacco Use  . Smoking status: Current Some Day Smoker    Packs/day: 0.30    Types: Cigarettes  . Smokeless tobacco: Never Used  Substance and Sexual Activity  . Alcohol use: Yes    Comment: beer occasionally  . Drug use: Yes    Types: Marijuana, Cocaine    Comment: Pt stated "I have a long hx of drug abuse."  . Sexual activity: Yes    Birth control/protection: None  Lifestyle  . Physical activity:    Days per week: Not on file    Minutes per session: Not on file  . Stress: Not on file  Relationships  . Social connections:    Talks on phone: Not on file    Gets together: Not on file    Attends religious service: Not on file    Active member of club or organization: Not on file    Attends meetings of clubs or organizations: Not on file    Relationship status: Not on file  Other Topics Concern  . Not on file  Social History Narrative  . Not on file   Additional Social History:  Sleep: Good  Appetite:  Fair  Current Medications: Current Facility-Administered Medications  Medication Dose Route Frequency Provider Last Rate Last Dose  . acetaminophen (TYLENOL) tablet 650 mg  650 mg Oral Q6H PRN Clapacs, John T, MD      . acyclovir (ZOVIRAX) 200 MG capsule 400 mg  400 mg Oral BID Clapacs, Jackquline Denmark, MD   400 mg at 05/12/17 0803  . alum & mag hydroxide-simeth (MAALOX/MYLANTA) 200-200-20 MG/5ML suspension 30 mL  30 mL Oral Q4H PRN Clapacs, John T, MD      . chlordiazePOXIDE (LIBRIUM) capsule 25 mg  25 mg Oral Q6H PRN McNew, Holly R, MD      . divalproex (DEPAKOTE) DR tablet 500 mg  500 mg Oral Q12H McNew, Ileene Hutchinson, MD   500 mg at 05/12/17 0803  . gabapentin (NEURONTIN) capsule 900 mg  900 mg Oral TID Haskell Riling, MD   900 mg at 05/12/17 1249  .  hydrOXYzine (ATARAX/VISTARIL) tablet 50 mg  50 mg Oral TID PRN Clapacs, Jackquline Denmark, MD      . hydrOXYzine (ATARAX/VISTARIL) tablet 50 mg  50 mg Oral BID Clapacs, Jackquline Denmark, MD   50 mg at 05/12/17 0803  . loperamide (IMODIUM) capsule 2-4 mg  2-4 mg Oral PRN McNew, Ileene Hutchinson, MD      . lurasidone (LATUDA) tablet 80 mg  80 mg Oral QHS Haskell Riling, MD   80 mg at 05/11/17 2103  . magnesium hydroxide (MILK OF MAGNESIA) suspension 30 mL  30 mL Oral Daily PRN Clapacs, John T, MD      . multivitamin with minerals tablet 1 tablet  1 tablet Oral Daily McNew, Ileene Hutchinson, MD   1 tablet at 05/12/17 0806  . ondansetron (ZOFRAN-ODT) disintegrating tablet 4 mg  4 mg Oral Q6H PRN McNew, Ileene Hutchinson, MD      . thiamine (VITAMIN B-1) tablet 100 mg  100 mg Oral Daily Clapacs, John T, MD   100 mg at 05/12/17 1610   Or  . thiamine (B-1) injection 100 mg  100 mg Intravenous Daily Clapacs, John T, MD      . traZODone (DESYREL) tablet 100 mg  100 mg Oral QHS PRN Clapacs, Jackquline Denmark, MD   100 mg at 05/10/17 2145  . venlafaxine XR (EFFEXOR-XR) 24 hr capsule 75 mg  75 mg Oral Q breakfast Clapacs, Jackquline Denmark, MD   75 mg at 05/12/17 0803    Lab Results:  Results for orders placed or performed during the hospital encounter of 05/10/17 (from the past 48 hour(s))  Hemoglobin A1c     Status: Abnormal   Collection Time: 05/11/17  6:57 AM  Result Value Ref Range   Hgb A1c MFr Bld 5.8 (H) 4.8 - 5.6 %    Comment: (NOTE) Pre diabetes:          5.7%-6.4% Diabetes:              >6.4% Glycemic control for   <7.0% adults with diabetes    Mean Plasma Glucose 119.76 mg/dL    Comment: Performed at [facility], [address] [place], [place] [address]  Lipid panel     Status: None   Collection Time: 05/11/17  6:57 AM  Result Value Ref Range   Cholesterol 163 0 - 200 mg/dL   Triglycerides 41 <409 mg/dL   HDL 59 >81 mg/dL   Total CHOL/HDL Ratio 2.8 RATIO   VLDL 8 0 - 40 mg/dL  LDL Cholesterol 96 0 - 99 mg/dL    Comment:        Total  Cholesterol/HDL:CHD Risk Coronary Heart Disease Risk Table                     Men   Women  1/2 Average Risk   3.4   3.3  Average Risk       5.0   4.4  2 X Average Risk   9.6   7.1  3 X Average Risk  23.4   11.0        Use the calculated Patient Ratio above and the CHD Risk Table to determine the patient's CHD Risk.        ATP III CLASSIFICATION (LDL):  <100     mg/dL   Optimal  161-096  mg/dL   Near or Above                    Optimal  130-159  mg/dL   Borderline  045-409  mg/dL   High  >811     mg/dL   Very High Performed at [facility], [address] [place], [place] [address]   TSH     Status: None   Collection Time: 05/11/17  6:57 AM  Result Value Ref Range   TSH 0.854 0.350 - 4.500 uIU/mL    Comment: Performed by a 3rd Generation assay with a functional sensitivity of <=0.01 uIU/mL. Performed at [facility], [address] [place], [place] [address]   Valproic acid level     Status: Abnormal   Collection Time: 05/12/17  6:34 AM  Result Value Ref Range   Valproic Acid Lvl 105 (H) 50.0 - 100.0 ug/mL    Comment: Performed at [facility], [address] [place], [place] [address]    Blood Alcohol level:  Lab Results  Component Value Date   [facility] <10 05/09/2017   ETH <10 03/11/2017    Metabolic Disorder Labs: Lab Results  Component Value Date   HGBA1C 5.8 (H) 05/11/2017   MPG 119.76 05/11/2017   No results found for: PROLACTIN Lab Results  Component Value Date   CHOL 163 05/11/2017   TRIG 41 05/11/2017   HDL 59 05/11/2017   CHOLHDL 2.8 05/11/2017   VLDL 8 05/11/2017   LDLCALC 96 05/11/2017   LDLCALC 129 (H) 12/04/2016    Physical Findings: AIMS: Facial and Oral Movements Muscles of Facial Expression: None, normal Lips and Perioral Area: None, normal Jaw: None, normal Tongue: None, normal,Extremity Movements Upper (arms, wrists, hands, fingers): None, normal Lower (legs, knees, ankles, toes): None, normal, Trunk  Movements Neck, shoulders, hips: None, normal, Overall Severity Severity of abnormal movements (highest score from questions above): None, normal Incapacitation due to abnormal movements: None, normal Patient's awareness of abnormal movements (rate only patient's report): No Awareness, Dental Status Current problems with teeth and/or dentures?: No Does patient usually wear dentures?: No  CIWA:  CIWA-Ar Total: 0 COWS:  COWS Total Score: 2  Musculoskeletal: Strength & Muscle Tone: within normal limits Gait & Station: normal Patient leans:   Psychiatric Specialty Exam: Physical Exam  Nursing note and vitals reviewed.   ROS  Blood pressure (!) 110/97, pulse 64, temperature 98.3 F (36.8 C), temperature source Oral, resp. rate 20, height  (1.702 m), weight 75.8 kg (167 lb), SpO2 99 %.Body mass index is 26.16 kg/m.  General Appearance: Disheveled  Eye Contact:  Minimal  Speech:  Slow  Volume:  Decreased  Mood:  Depressed  Affect:  Congruent  Thought Process:  Coherent and Goal Directed  Orientation:  Full (Time, Place, and Person)  Thought Content:  Logical, back pain  Suicidal Thoughts:  Yes intermittent.  with intent/plan  Homicidal Thoughts:  No  Memory:  Immediate;   Fair  Judgement:  Fair  Insight:  Fair  Psychomotor Activity:  Normal  Concentration:  Concentration: Fair  Recall:  [employer] of Knowledge:  Fair  Language:  Fair  Akathisia:  No      Assets:  Resilience  ADL's:  Intact  Cognition:  WNL  Sleep:  Number of Hours: 7.3      Treatment Plan Summary: Daily contact with patient to assess and evaluate symptoms and progress in treatment and Medication management. Pt admitted due to SI and polysubstance abuse, pt endorsing depression and intermittent SI.  Cont latuda, Depakote, effexor . Add Baclofen prn for muscle spasm/pain.    Beverly Sessions, MD 05/12/2017, 1:07 PM

## 2017-05-12 NOTE — Plan of Care (Signed)
Patient is alert and oriented x 4. Ambulates that unit with steady gait. Denies any pain at this time. Affect and mood remains flat and blunted but his mood brightens when spoken to. Compliant with meals and medications. Denies SI/HI/AVH. Although patient was present in the milieu for meals and medications, patient mostly in bed resting in bed with eyes closed. Milieu remains safe with q 15 minute safety checks. Will continue to monitor.

## 2017-05-12 NOTE — Progress Notes (Signed)
D:Pt denies SI/HI/AVH. Pt. During assessment denies SI/Hi and reports no plan to hurt himself. Pt. Asked if he can remain safe while on the unit and verbally contracts for safety and verbalizes he can remain safe. Pt. Reports doing, "good" this evening. Pt. has no complaints of withdraw symptoms or emotional complaints. Pt. Compliant with EKG testing, with copy placed on chart for MD to review. Pt. Isolative frequently, but does participate in snacks with peers. Pt. Monitored utilizing CIWA scoring for safety per MD orders.     A: Q x 15 minute observation checks were completed for safety. Patient was provided with education. Patient was given scheduled medications. Patient  was encourage to attend groups, participate in unit activities and continue with plan of care. Pt. Chart and care plans reviewed. Pt. Given support and encouragement.   R:Patient is complaint with medication and unit procedures. Pt. Does not attend groups.             Precautionary checks every 15 minutes for safety maintained, room free of safety hazards, patient sustains no injury or falls during this shift.

## 2017-05-13 MED ORDER — LURASIDONE HCL 80 MG PO TABS
80.0000 mg | ORAL_TABLET | Freq: Every day | ORAL | Status: DC
  Administered 2017-05-13: 80 mg via ORAL
  Filled 2017-05-13: qty 1

## 2017-05-13 MED ORDER — VENLAFAXINE HCL ER 75 MG PO CP24
150.0000 mg | ORAL_CAPSULE | Freq: Every day | ORAL | Status: DC
  Administered 2017-05-14: 150 mg via ORAL
  Filled 2017-05-13: qty 2

## 2017-05-13 NOTE — Plan of Care (Signed)
Patient has the ability to make decisions regarding his treatment as well as verbalize positive feelings about himself. Patient denies SI/HI/AVH at this time stating "not right now". Patient's stated goal for today is "to be better than I was yesterday". Patient verbalizes understanding of his condition as well as the changes in his lifestyle that needs to be made in order to reduce recurrence. Patient verbalizes understanding of his discharge needs as well as the available resources that can help assist him in meeting his health-care needs. Patient has remained free from injury on the unit thus far. Patient has shown interest in leisure activity by attending/participating in unit groups when appropriate for him. Patient has the ability to cope and verbalize his feelings. Patient verbalizes understanding of the general information that has been provided to him without voicing any further questions/concerns at this time. Patient has been free from any health-related complications and has been working to maintain his clinical measurements within normal limits. Patient's risk for activity intolerance has decreased. Patient rates his anxiety level a "5/10", but can not verbalize what is making him feel this way. Patient's risk for impaired skin integrity has decreased and he is safe on the unit at this time.  Problem: Education: Goal: Ability to make informed decisions regarding treatment will improve Outcome: Progressing   Problem: Self-Concept: Goal: Ability to disclose and discuss suicidal ideas will improve Outcome: Progressing Goal: Will verbalize positive feelings about self Outcome: Progressing   Problem: Education: Goal: Knowledge of disease or condition will improve Outcome: Progressing Goal: Understanding of discharge needs will improve Outcome: Progressing   Problem: Health Behavior/Discharge Planning: Goal: Ability to identify changes in lifestyle to reduce recurrence of condition will  improve Outcome: Progressing Goal: Identification of resources available to assist in meeting health care needs will improve Outcome: Progressing   Problem: Safety: Goal: Ability to remain free from injury will improve Outcome: Progressing   Problem: Activity: Goal: Interest or engagement in leisure activities will improve Outcome: Progressing Goal: Imbalance in normal sleep/wake cycle will improve Outcome: Progressing   Problem: Coping: Goal: Coping ability will improve Outcome: Progressing Goal: Will verbalize feelings Outcome: Progressing   Problem: Activity: Goal: Will identify at least one activity in which they can participate Outcome: Progressing   Problem: Coping: Goal: Ability to identify and develop effective coping behavior will improve Outcome: Progressing   Problem: Self-Concept: Goal: Will verbalize positive feelings about self Outcome: Progressing   Problem: Education: Goal: Knowledge of General Education information will improve Outcome: Progressing   Problem: Health Behavior/Discharge Planning: Goal: Ability to manage health-related needs will improve Outcome: Progressing   Problem: Clinical Measurements: Goal: Ability to maintain clinical measurements within normal limits will improve Outcome: Progressing Goal: Will remain free from infection Outcome: Progressing Goal: Diagnostic test results will improve Outcome: Progressing Goal: Respiratory complications will improve Outcome: Progressing Goal: Cardiovascular complication will be avoided Outcome: Progressing   Problem: Activity: Goal: Risk for activity intolerance will decrease Outcome: Progressing   Problem: Nutrition: Goal: Adequate nutrition will be maintained Outcome: Progressing   Problem: Coping: Goal: Level of anxiety will decrease Outcome: Progressing   Problem: Elimination: Goal: Will not experience complications related to bowel motility Outcome: Progressing Goal: Will  not experience complications related to urinary retention Outcome: Progressing   Problem: Pain Managment: Goal: General experience of comfort will improve Outcome: Progressing   Problem: Safety: Goal: Ability to remain free from injury will improve Outcome: Progressing   Problem: Skin Integrity: Goal: Risk for impaired skin integrity will  decrease Outcome: Progressing

## 2017-05-13 NOTE — Plan of Care (Signed)
Pt. Denies SI/Hi. Pt. Verbally is able to contract for safety. Pt. Reports he can remain safe while on the unit.   Problem: Self-Concept: Goal: Ability to disclose and discuss suicidal ideas will improve Outcome: Progressing   Problem: Safety: Goal: Ability to remain free from injury will improve Outcome: Progressing   Problem: Safety: Goal: Ability to remain free from injury will improve Outcome: Progressing

## 2017-05-13 NOTE — [facility] Group Notes (Signed)
LCSW Group Therapy Note 05/13/2017 1:15pm  Type of Therapy and Topic: Group Therapy: Feelings Around Returning Home & Establishing a Supportive Framework and Supporting Oneself When Supports Not Available  Participation Level: Active  Description of Group:  Patients first processed thoughts and feelings about upcoming discharge. These included fears of upcoming changes, lack of change, new living environments, judgements and expectations from others and overall stigma of mental health issues. The group then discussed the definition of a supportive framework, what that looks and feels like, and how do to discern it from an unhealthy non-supportive network. The group identified different types of supports as well as what to do when your family/friends are less than helpful or unavailable  Therapeutic Goals  1. Patient will identify one healthy supportive network that they can use at discharge. 2. Patient will identify one factor of a supportive framework and how to tell it from an unhealthy network. 3. Patient able to identify one coping skill to use when they do not have positive supports from others. 4. Patient will demonstrate ability to communicate their needs through discussion and/or role plays.  Summary of Patient Progress:  The patient scored his mood between a 3 and a 4 (10 best.) Pt engaged during group session. As patients processed their anxiety about discharge and described healthy supports patient shared he is ready to be discharge and scared at the same time due to his housing situation. 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  Patients identified at least one self-care tool they were willing to use after discharge; going back to school and AA/NA meetings.   Therapeutic Modalities Cognitive Behavioral Therapy Motivational Interviewing   Andreas Sobolewski  CUEBAS-COLON, LCSW 05/13/2017 12:57 PM

## 2017-05-13 NOTE — Plan of Care (Signed)
Pt out in the dayroom watching tv. Pt denies SI/HI. Pt compliant with medication. Pt given two prn's baclofen 10 mg for back pain and trazodone 100 mg for sleep. Pt has no other complaints at this time. Will continue to monitor.

## 2017-05-13 NOTE — Progress Notes (Signed)
D- Patient alert and oriented. Patient presents in a pleasant mood on assessment stating that he didn't sleep well last night. Patient had complaints of lower back pain as well as signs/symptoms of depression/anxiety. Patient rated his pain level a "9/10" and did request pain medication from this Clinical research associate. Patient rates his depression a "8/10" stating to this writer he's depressed because of "my life", however he can not verbalize to this Clinical research associate why he is feeling anxious. Patient denies SI, HI, AVH, at this time. Patient's goal for today is "to be better than I was yesterday".  A- Scheduled medications administered to patient, per MD orders. Support and encouragement provided.  Routine safety checks conducted every 15 minutes.  Patient informed to notify staff with problems or concerns.  R- No adverse drug reactions noted. Patient contracts for safety at this time. Patient compliant with medications and treatment plan. Patient receptive, calm, and cooperative. Patient interacts well with others on the unit.  Patient remains safe at this time.

## 2017-05-13 NOTE — Progress Notes (Addendum)
[facility] MD Progress Note  05/13/2017 1:12 PM Danny Lester  MRN:  [id number] Subjective:   I am going through a lot of stress" Pt continue to endorse depression and  SI, denies plan,  contracts for safety. Pt not scoring on CIWA, was discontinued. Pt taking prn  baclofen for back pain. States " I am going through a lot of stress" including I don't know where I am going from here.    Principal Problem: Severe recurrent major depression without psychotic features (HCC) Diagnosis:   Patient Active Problem List   Diagnosis Date Noted  . Alcohol abuse [F10.10] 05/10/2017  . Severe recurrent major depression without psychotic features (HCC) [F33.2] 05/10/2017  . Cocaine abuse (HCC) [F14.10] 03/12/2017  . Adjustment disorder with depressed mood [F43.21] 01/28/2017  . Pelvic fracture (HCC) [S32.9XXA] 01/02/2017  . Herpes simplex [B00.9] 02/11/2016  . Leukocytosis [D72.829] 12/01/2015  . Carpal tunnel syndrome, bilateral [G56.03] 09/28/2015  . Plantar fasciitis, bilateral [M72.2] 09/28/2015   Total Time spent with patient: 30 minutes  Past Psychiatric History: no new info  Past Medical History:  Past Medical History:  Diagnosis Date  . Anxiety   . Broken arm   . Depression   . 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 Nerve pain   . PTSD (post-traumatic stress disorder)   . Schizophrenia, paranoid, chronic (HCC)     Past Surgical History:  Procedure Laterality Date  . KNEE SURGERY     Family History: History reviewed. No pertinent family history. Family Psychiatric  History: no new info Social History:  Social History   Substance and Sexual Activity  Alcohol Use Yes   Comment: beer occasionally     Social History   Substance and Sexual Activity  Drug Use Yes  . Types: Marijuana, Cocaine   Comment: Pt stated "I have a long hx of drug abuse."    Social History   Socioeconomic History  . Marital status: Single    Spouse name: Not on file  . Number of children: Not on  file  . Years of education: Not on file  . Highest education level: Not on file  Occupational History  . Not on file  Social Needs  . Financial resource strain: Not on file  . Food insecurity:    Worry: Not on file    Inability: Not on file  . Transportation needs:    Medical: Not on file    Non-medical: Not on file  Tobacco Use  . Smoking status: Current Some Day Smoker    Packs/day: 0.30    Types: Cigarettes  . Smokeless tobacco: Never Used  Substance and Sexual Activity  . Alcohol use: Yes    Comment: beer occasionally  . Drug use: Yes    Types: Marijuana, Cocaine    Comment: Pt stated "I have a long hx of drug abuse."  . Sexual activity: Yes    Birth control/protection: None  Lifestyle  . Physical activity:    Days per week: Not on file    Minutes per session: Not on file  . Stress: Not on file  Relationships  . Social connections:    Talks on phone: Not on file    Gets together: Not on file    Attends religious service: Not on file    Active member of club or organization: Not on file    Attends meetings of clubs or organizations: Not on file    Relationship status: Not on file  Other Topics Concern  .  Not on file  Social History Narrative  . Not on file   Additional Social History:                         Sleep: Good  Appetite:  Fair  Current Medications: Current Facility-Administered Medications  Medication Dose Route Frequency Provider Last Rate Last Dose  . acetaminophen (TYLENOL) tablet 650 mg  650 mg Oral Q6H PRN Clapacs, John T, MD      . acyclovir (ZOVIRAX) 200 MG capsule 400 mg  400 mg Oral BID Clapacs, Jackquline Denmark, MD   400 mg at 05/13/17 0828  . alum & mag hydroxide-simeth (MAALOX/MYLANTA) 200-200-20 MG/5ML suspension 30 mL  30 mL Oral Q4H PRN Clapacs, John T, MD      . baclofen (LIORESAL) tablet 10 mg  10 mg Oral BID PRN Beverly Sessions, MD   10 mg at 05/13/17 1610  . chlordiazePOXIDE (LIBRIUM) capsule 25 mg  25 mg Oral Q6H PRN McNew,  Holly R, MD      . divalproex (DEPAKOTE) DR tablet 500 mg  500 mg Oral Q12H McNew, Ileene Hutchinson, MD   500 mg at 05/13/17 9604  . gabapentin (NEURONTIN) capsule 900 mg  900 mg Oral TID Haskell Riling, MD   900 mg at 05/13/17 1203  . hydrOXYzine (ATARAX/VISTARIL) tablet 50 mg  50 mg Oral TID PRN Clapacs, John T, MD      . hydrOXYzine (ATARAX/VISTARIL) tablet 50 mg  50 mg Oral BID Clapacs, Jackquline Denmark, MD   50 mg at 05/13/17 0827  . loperamide (IMODIUM) capsule 2-4 mg  2-4 mg Oral PRN McNew, Ileene Hutchinson, MD      . lurasidone (LATUDA) tablet 80 mg  80 mg Oral QHS Haskell Riling, MD   80 mg at 05/12/17 2126  . magnesium hydroxide (MILK OF MAGNESIA) suspension 30 mL  30 mL Oral Daily PRN Clapacs, John T, MD      . multivitamin with minerals tablet 1 tablet  1 tablet Oral Daily McNew, Ileene Hutchinson, MD   1 tablet at 05/13/17 0827  . ondansetron (ZOFRAN-ODT) disintegrating tablet 4 mg  4 mg Oral Q6H PRN McNew, Ileene Hutchinson, MD      . thiamine (VITAMIN B-1) tablet 100 mg  100 mg Oral Daily Clapacs, John T, MD   100 mg at 05/13/17 5409   Or  . thiamine (B-1) injection 100 mg  100 mg Intravenous Daily Clapacs, John T, MD      . traZODone (DESYREL) tablet 100 mg  100 mg Oral QHS PRN Clapacs, Jackquline Denmark, MD   100 mg at 05/10/17 2145  . venlafaxine XR (EFFEXOR-XR) 24 hr capsule 75 mg  75 mg Oral Q breakfast Clapacs, Jackquline Denmark, MD   75 mg at 05/13/17 8119    Lab Results:  Results for orders placed or performed during the hospital encounter of 05/10/17 (from the past 48 hour(s))  Valproic acid level     Status: Abnormal   Collection Time: 05/12/17  6:34 AM  Result Value Ref Range   Valproic Acid Lvl 105 (H) 50.0 - 100.0 ug/mL    Comment: Performed at [facility], [address] [place], [place] [address]    Blood Alcohol level:  Lab Results  Component Value Date   [facility] <10 05/09/2017   ETH <10 03/11/2017    Metabolic Disorder Labs: Lab Results  Component Value Date   HGBA1C 5.8 (H) 05/11/2017   MPG 119.76  05/11/2017    No results found for: PROLACTIN Lab Results  Component Value Date   CHOL 163 05/11/2017   TRIG 41 05/11/2017   HDL 59 05/11/2017   CHOLHDL 2.8 05/11/2017   VLDL 8 05/11/2017   LDLCALC 96 05/11/2017   LDLCALC 129 (H) 12/04/2016    Physical Findings: AIMS: Facial and Oral Movements Muscles of Facial Expression: None, normal Lips and Perioral Area: None, normal Jaw: None, normal Tongue: None, normal,Extremity Movements Upper (arms, wrists, hands, fingers): None, normal Lower (legs, knees, ankles, toes): None, normal, Trunk Movements Neck, shoulders, hips: None, normal, Overall Severity Severity of abnormal movements (highest score from questions above): None, normal Incapacitation due to abnormal movements: None, normal Patient's awareness of abnormal movements (rate only patient's report): No Awareness, Dental Status Current problems with teeth and/or dentures?: No Does patient usually wear dentures?: No  CIWA:  CIWA-Ar Total: 0 COWS:  COWS Total Score: 2  Musculoskeletal: Strength & Muscle Tone: within normal limits Gait & Station: normal Patient leans:   Psychiatric Specialty Exam: Physical Exam  Nursing note and vitals reviewed.   ROS  Blood pressure (!) 108/94, pulse 75, temperature 98.1 F (36.7 C), temperature source Oral, resp. rate 16, height  (1.702 m), weight 75.8 kg (167 lb), SpO2 98 %.Body mass index is 26.16 kg/m.  General Appearance: Disheveled  Eye Contact:  Minimal  Speech:  Slow  Volume:  Decreased  Mood:  Depressed  Affect:  Congruent  Thought Process:  Coherent and Goal Directed  Orientation:  Full (Time, Place, and Person)  Thought Content:  Logical, back pain  Suicidal Thoughts:  Yes intermittent, no plan. Hopelessness, helplessness  Homicidal Thoughts:  No  Memory:  Immediate;   Fair  Judgement:  Fair  Insight:  Fair  Psychomotor Activity:  Normal  Concentration:  Concentration: Fair  Recall:  [employer] of Knowledge:  Fair   Language:  Fair  Akathisia:  No      Assets:  Resilience  ADL's:  Intact  Cognition:  WNL  Sleep:  Number of Hours: 7.3      Treatment Plan Summary: Daily contact with patient to assess and evaluate symptoms and progress in treatment and Medication management. Pt admitted due to SI and polysubstance abuse, pt endorsing depression and  SI, hopelessness, helplessness. Depkaote level- 105.  Change hs latuda with supper, cont  Depakote,  increase Effexor .    Beverly Sessions, MD 05/13/2017, 1:12 PMPatient ID: Melbourne Abts, male   DOB: 11-18-1975, 42 y.o.   MRN: [id number]

## 2017-05-13 NOTE — Progress Notes (Signed)
D:Pt denies SI/HI/AVH. Pt is pleasant and cooperative, but affect is flat. Pt. has no complaints.  Patient Interaction minimally engaging this evening, but appropriate. Pt. Does not socialize with peers or staff much this evening. Pt. Reports sleeping and eating good.   A: Q x 15 minute observation checks were completed for safety. Patient was provided with education. Patient was given scheduled medications. Patient  was encourage to attend groups, participate in unit activities and continue with plan of care. Pt. Chart and care plans reviewed. Pt. Given support and encouragement.   R:Patient is complaint with medication and unit procedures. Pt. Does not attend groups. Pt. CIWA scoring discontinued per MD orders.             Precautionary checks every 15 minutes for safety maintained, room free of safety hazards, patient sustains no injury or falls during this shift.

## 2017-05-14 MED ORDER — VENLAFAXINE HCL ER 75 MG PO CP24
75.0000 mg | ORAL_CAPSULE | Freq: Every day | ORAL | Status: DC
  Administered 2017-05-15 – 2017-05-16 (×2): 75 mg via ORAL
  Filled 2017-05-14 (×2): qty 1

## 2017-05-14 MED ORDER — LURASIDONE HCL 40 MG PO TABS
40.0000 mg | ORAL_TABLET | Freq: Every day | ORAL | Status: DC
  Administered 2017-05-14: 40 mg via ORAL
  Filled 2017-05-14: qty 1

## 2017-05-14 NOTE — Plan of Care (Signed)
Affect constricted.  Denies SI/HI/AVH.  Visible in the milieu. Interacts with peers appropriately.  Answers questions without any elaboration. Maintains good eye contact.  Medication and group compliant. Good appetite, maintains personal care chores appropriately.  Support and encouragement offered.  Safety rounds maintained.   Problem: Education: Goal: Ability to make informed decisions regarding treatment will improve Outcome: Progressing   Problem: Self-Concept: Goal: Ability to disclose and discuss suicidal ideas will improve Outcome: Progressing Goal: Will verbalize positive feelings about self Outcome: Progressing   Problem: Education: Goal: Knowledge of disease or condition will improve Outcome: Progressing Goal: Understanding of discharge needs will improve Outcome: Progressing   Problem: Health Behavior/Discharge Planning: Goal: Ability to identify changes in lifestyle to reduce recurrence of condition will improve Outcome: Progressing Goal: Identification of resources available to assist in meeting health care needs will improve Outcome: Progressing   Problem: Safety: Goal: Ability to remain free from injury will improve Outcome: Progressing   Problem: Activity: Goal: Interest or engagement in leisure activities will improve Outcome: Progressing Goal: Imbalance in normal sleep/wake cycle will improve Outcome: Progressing   Problem: Coping: Goal: Coping ability will improve Outcome: Progressing Goal: Will verbalize feelings Outcome: Progressing   Problem: Activity: Goal: Will identify at least one activity in which they can participate Outcome: Progressing   Problem: Coping: Goal: Ability to identify and develop effective coping behavior will improve Outcome: Progressing   Problem: Self-Concept: Goal: Will verbalize positive feelings about self Outcome: Progressing   Problem: Coping: Goal: Level of anxiety will decrease Outcome: Progressing   Problem:  Pain Managment: Goal: General experience of comfort will improve Outcome: Progressing   Problem: Safety: Goal: Ability to remain free from injury will improve Outcome: Progressing

## 2017-05-14 NOTE — Progress Notes (Signed)
Recreation Therapy Notes  Date: 05/14/2017  Time: 9:30 am  Location: Craft Room  Behavioral response: Appropriate  Intervention Topic: Problem Solving  Discussion/Intervention:  Group content on today was focused on problem solving. The group described what problem solving is. Patients expressed how problems affect them and how they deal with problems. Individuals identified healthy ways to deal with problems. Patients explained what normally happens to them when they do not deal with problems. The group expressed reoccurring problems for them. The group participated in the intervention "Ways to Solve problems" where patients were given a chance to explore different ways to solve problems.  Clinical Observations/Feedback:  Patient came to group and was focused on what his peers and staff had to say about problem solving. He was social with peers and staff while participating in the intervention.   Atira Borello LRT/CTRS          Hernando Reali 05/14/2017 11:00 AM

## 2017-05-14 NOTE — [facility] Group Notes (Signed)
[facility] Group Notes:  (Nursing/MHT/Case Management/Adjunct)  Date:  05/14/2017  Time:  9:11 PM  Type of Therapy:  Group Therapy  Participation Level:  Did Not Attend   Mayra Neer 05/14/2017, 9:11 PM

## 2017-05-14 NOTE — Progress Notes (Signed)
[facility] MD Progress Note  05/14/2017 3:02 PM Danny Lester  MRN:  [id number] Subjective:  Pt states that he is feeling more hopeful because he is getting help that he needs. He is glad that he was referred to [facility] for drug treatment. He is motivated for this. HE called to let his probation off know the plan. His affect is brighter and smiling today. He states that he feels "jittery with some of the medications." He would like to lower some of the doses. He is sleeping okay and eating better. He reports passive SI "that come and go." NO plan or intent. Denies HI, AH, VH.   Principal Problem: Severe recurrent major depression without psychotic features (HCC) Diagnosis:   Patient Active Problem List   Diagnosis Date Noted  . Severe recurrent major depression without psychotic features (HCC) [F33.2] 05/10/2017    Priority: High  . Alcohol abuse [F10.10] 05/10/2017  . Cocaine abuse (HCC) [F14.10] 03/12/2017  . Adjustment disorder with depressed mood [F43.21] 01/28/2017  . Pelvic fracture (HCC) [S32.9XXA] 01/02/2017  . Herpes simplex [B00.9] 02/11/2016  . Leukocytosis [D72.829] 12/01/2015  . Carpal tunnel syndrome, bilateral [G56.03] 09/28/2015  . Plantar fasciitis, bilateral [M72.2] 09/28/2015   Total Time spent with patient: 20 minutes  Past Psychiatric History: See H&P  Past Medical History:  Past Medical History:  Diagnosis Date  . Anxiety   . Broken arm   . Depression   . 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 Nerve pain   . PTSD (post-traumatic stress disorder)   . Schizophrenia, paranoid, chronic (HCC)     Past Surgical History:  Procedure Laterality Date  . KNEE SURGERY     Family History: History reviewed. No pertinent family history. Family Psychiatric  History: See H&P Social History:  Social History   Substance and Sexual Activity  Alcohol Use Yes   Comment: beer occasionally     Social History   Substance and Sexual Activity  Drug Use Yes  . Types:  Marijuana, Cocaine   Comment: Pt stated "I have a long hx of drug abuse."    Social History   Socioeconomic History  . Marital status: Single    Spouse name: Not on file  . Number of children: Not on file  . Years of education: Not on file  . Highest education level: Not on file  Occupational History  . Not on file  Social Needs  . Financial resource strain: Not on file  . Food insecurity:    Worry: Not on file    Inability: Not on file  . Transportation needs:    Medical: Not on file    Non-medical: Not on file  Tobacco Use  . Smoking status: Current Some Day Smoker    Packs/day: 0.30    Types: Cigarettes  . Smokeless tobacco: Never Used  Substance and Sexual Activity  . Alcohol use: Yes    Comment: beer occasionally  . Drug use: Yes    Types: Marijuana, Cocaine    Comment: Pt stated "I have a long hx of drug abuse."  . Sexual activity: Yes    Birth control/protection: None  Lifestyle  . Physical activity:    Days per week: Not on file    Minutes per session: Not on file  . Stress: Not on file  Relationships  . Social connections:    Talks on phone: Not on file    Gets together: Not on file    Attends religious service: Not on file  Active member of club or organization: Not on file    Attends meetings of clubs or organizations: Not on file    Relationship status: Not on file  Other Topics Concern  . Not on file  Social History Narrative  . Not on file   Additional Social History:                         Sleep: Fair  Appetite:  Good  Current Medications: Current Facility-Administered Medications  Medication Dose Route Frequency Provider Last Rate Last Dose  . acetaminophen (TYLENOL) tablet 650 mg  650 mg Oral Q6H PRN Clapacs, John T, MD      . acyclovir (ZOVIRAX) 200 MG capsule 400 mg  400 mg Oral BID Clapacs, Jackquline Denmark, MD   400 mg at 05/14/17 0820  . alum & mag hydroxide-simeth (MAALOX/MYLANTA) 200-200-20 MG/5ML suspension 30 mL  30 mL Oral  Q4H PRN Clapacs, John T, MD      . baclofen (LIORESAL) tablet 10 mg  10 mg Oral BID PRN Beverly Sessions, MD   10 mg at 05/14/17 0829  . divalproex (DEPAKOTE) DR tablet 500 mg  500 mg Oral Q12H Kasyn Stouffer, Ileene Hutchinson, MD   500 mg at 05/14/17 0820  . gabapentin (NEURONTIN) capsule 900 mg  900 mg Oral TID Haskell Riling, MD   900 mg at 05/14/17 1202  . hydrOXYzine (ATARAX/VISTARIL) tablet 50 mg  50 mg Oral TID PRN Clapacs, John T, MD      . hydrOXYzine (ATARAX/VISTARIL) tablet 50 mg  50 mg Oral BID Clapacs, Jackquline Denmark, MD   50 mg at 05/14/17 0820  . lurasidone (LATUDA) tablet 40 mg  40 mg Oral Q supper Forbes Loll, Ileene Hutchinson, MD      . magnesium hydroxide (MILK OF MAGNESIA) suspension 30 mL  30 mL Oral Daily PRN Clapacs, John T, MD      . multivitamin with minerals tablet 1 tablet  1 tablet Oral Daily Starsky Nanna, Ileene Hutchinson, MD   1 tablet at 05/14/17 0820  . thiamine (VITAMIN B-1) tablet 100 mg  100 mg Oral Daily Clapacs, John T, MD   100 mg at 05/14/17 0820   Or  . thiamine (B-1) injection 100 mg  100 mg Intravenous Daily Clapacs, John T, MD      . traZODone (DESYREL) tablet 100 mg  100 mg Oral QHS PRN Clapacs, Jackquline Denmark, MD   100 mg at 05/13/17 2200  . [START ON 05/15/2017] venlafaxine XR (EFFEXOR-XR) 24 hr capsule 75 mg  75 mg Oral Q breakfast Robynne Roat, Ileene Hutchinson, MD        Lab Results: No results found for this or any previous visit (from the past 48 hour(s)).  Blood Alcohol level:  Lab Results  Component Value Date   ETH <10 05/09/2017   ETH <10 03/11/2017    Metabolic Disorder Labs: Lab Results  Component Value Date   HGBA1C 5.8 (H) 05/11/2017   MPG 119.76 05/11/2017   No results found for: PROLACTIN Lab Results  Component Value Date   CHOL 163 05/11/2017   TRIG 41 05/11/2017   HDL 59 05/11/2017   CHOLHDL 2.8 05/11/2017   VLDL 8 05/11/2017   LDLCALC 96 05/11/2017   LDLCALC 129 (H) 12/04/2016    Physical Findings: AIMS: Facial and Oral Movements Muscles of Facial Expression: None, normal Lips and  Perioral Area: None, normal Jaw: None, normal Tongue: None, normal,Extremity Movements Upper (arms, wrists, hands, fingers): None,  normal Lower (legs, knees, ankles, toes): None, normal, Trunk Movements Neck, shoulders, hips: None, normal, Overall Severity Severity of abnormal movements (highest score from questions above): None, normal Incapacitation due to abnormal movements: None, normal Patient's awareness of abnormal movements (rate only patient's report): No Awareness, Dental Status Current problems with teeth and/or dentures?: No Does patient usually wear dentures?: No  CIWA:  CIWA-Ar Total: 0 COWS:  COWS Total Score: 2  Musculoskeletal: Strength & Muscle Tone: within normal limits Gait & Station: normal Patient leans: N/A  Psychiatric Specialty Exam: Physical Exam  Nursing note and vitals reviewed.   Review of Systems  All other systems reviewed and are negative.   Blood pressure 112/90, pulse 72, temperature 98.1 F (36.7 C), temperature source Oral, resp. rate 18, height  (1.702 m), weight 75.8 kg (167 lb), SpO2 99 %.Body mass index is 26.16 kg/m.  General Appearance: Casual  Eye Contact:  Good  Speech:  Clear and Coherent  Volume:  Normal  Mood:  Depressed  Affect:  Constricted but brighter today  Thought Process:  Coherent and Goal Directed  Orientation:  Full (Time, Place, and Person)  Thought Content:  Logical  Suicidal Thoughts:  Passive SI  Homicidal Thoughts:  No  Memory:  Immediate;   Fair  Judgement:  Fair  Insight:  Fair  Psychomotor Activity:  Normal  Concentration:  Concentration: Fair  Recall:  [employer] of Knowledge:  Fair  Language:  Fair  Akathisia:  No      Assets:  Resilience  ADL's:  Intact  Cognition:  WNL  Sleep:  Number of Hours: 9.15     Treatment Plan Summary: 42 yo male admitted due to SI and substance abuse. He was referred to [facility] and accepted for Wednesday. Mood is improving and he is much more hopeful about the  future. SI is resolving.   Plan:  Mood -Decrease Latuda to 40 mg daily. HE states that I makes him jittery. He does not have any overt psychosis -Continue Depakote 500 mg BID> Level was 105 but this was when he was taking higher dose -Decrease Effexor XR back to 75 mg due to feeling jittery -Continue Gabapentin 900 mg TID -EKG QTc 372   alcohol and crack use disorder -He will go to [facility] on Wednesday  Haskell Riling, MD 05/14/2017, 3:02 PM

## 2017-05-15 MED ORDER — ADULT MULTIVITAMIN W/MINERALS CH: 1.0000 | ORAL_TABLET | Freq: Every day | ORAL | Status: AC

## 2017-05-15 MED ORDER — LURASIDONE HCL 40 MG PO TABS
20.0000 mg | ORAL_TABLET | Freq: Every day | ORAL | Status: DC
  Administered 2017-05-15: 20 mg via ORAL
  Filled 2017-05-15: qty 1

## 2017-05-15 MED ORDER — LURASIDONE HCL 20 MG PO TABS: 20.0000 mg | ORAL_TABLET | Freq: Every day | ORAL | Status: AC

## 2017-05-15 MED ORDER — HYDROXYZINE HCL 25 MG PO TABS: 25.0000 mg | ORAL_TABLET | Freq: Three times a day (TID) | ORAL | 0 refills | Status: AC | PRN

## 2017-05-15 MED ORDER — THIAMINE HCL 100 MG PO TABS: 100.0000 mg | ORAL_TABLET | Freq: Every day | ORAL | Status: AC

## 2017-05-15 MED ORDER — DIVALPROEX SODIUM 500 MG PO DR TAB: 500.0000 mg | DELAYED_RELEASE_TABLET | Freq: Two times a day (BID) | ORAL | Status: AC

## 2017-05-15 MED ORDER — GABAPENTIN 300 MG PO CAPS: 900.0000 mg | ORAL_CAPSULE | Freq: Three times a day (TID) | ORAL | Status: AC

## 2017-05-15 NOTE — [facility] Group Notes (Signed)
05/15/2017 1PM  Type of Therapy/Topic:  Group Therapy:  Feelings about Diagnosis  Participation Level:  Active   Description of Group:   This group will allow patients to explore their thoughts and feelings about diagnoses they have received. Patients will be guided to explore their level of understanding and acceptance of these diagnoses. Facilitator will encourage patients to process their thoughts and feelings about the reactions of others to their diagnosis and will guide patients in identifying ways to discuss their diagnosis with significant others in their lives. This group will be process-oriented, with patients participating in exploration of their own experiences, giving and receiving support, and processing challenge from other group members.   Therapeutic Goals: 1. Patient will demonstrate understanding of diagnosis as evidenced by identifying two or more symptoms of the disorder 2. Patient will be able to express two feelings regarding the diagnosis 3. Patient will demonstrate their ability to communicate their needs through discussion and/or role play  Summary of Patient Progress: Actively and appropriately engaged in the group. Patient was able to provide support and validation to other group members.Patient practiced active listening when interacting with the facilitator and other group members. Danny Lester states that "I was in denial when I was first diagnosed. I have children who are also struggling with the same issues that I am. I have accepted my diagnosis because I know that treatment will not be beneficial if I don't. I can communicate my needs better by being honest and telling the truth." Patient in still in the process of obtaining treatment goals.       Therapeutic Modalities:   Cognitive Behavioral Therapy Brief Therapy Feelings Identification    Danny Shears, LCSW 05/15/2017 2:03 PM

## 2017-05-15 NOTE — Progress Notes (Signed)
[facility] MD Progress Note  05/15/2017 12:06 PM Danny Lester  MRN:  [id number] Subjective:  Pt states that he is feeling better. He is much more hopeful about treatment and moving forward. He is motivated to get sober and move on. He is sleeping better. Appetite is good. He reports improvement in mood and denies SI. He feels "like a wanna run when I take my Latuda." HE feels restless when he gets the dose.   Principal Problem: Severe recurrent major depression without psychotic features (HCC) Diagnosis:   Patient Active Problem List   Diagnosis Date Noted  . Severe recurrent major depression without psychotic features (HCC) [F33.2] 05/10/2017    Priority: High  . Alcohol abuse [F10.10] 05/10/2017  . Cocaine abuse (HCC) [F14.10] 03/12/2017  . Adjustment disorder with depressed mood [F43.21] 01/28/2017  . Pelvic fracture (HCC) [S32.9XXA] 01/02/2017  . Herpes simplex [B00.9] 02/11/2016  . Leukocytosis [D72.829] 12/01/2015  . Carpal tunnel syndrome, bilateral [G56.03] 09/28/2015  . Plantar fasciitis, bilateral [M72.2] 09/28/2015   Total Time spent with patient: 20 minutes  Past Psychiatric History: See H&p  Past Medical History:  Past Medical History:  Diagnosis Date  . Anxiety   . Broken arm   . Depression   . 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 Nerve pain   . PTSD (post-traumatic stress disorder)   . Schizophrenia, paranoid, chronic (HCC)     Past Surgical History:  Procedure Laterality Date  . KNEE SURGERY     Family History: History reviewed. No pertinent family history. Family Psychiatric  History: See H&P Social History:  Social History   Substance and Sexual Activity  Alcohol Use Yes   Comment: beer occasionally     Social History   Substance and Sexual Activity  Drug Use Yes  . Types: Marijuana, Cocaine   Comment: Pt stated "I have a long hx of drug abuse."    Social History   Socioeconomic History  . Marital status: Single    Spouse name: Not on  file  . Number of children: Not on file  . Years of education: Not on file  . Highest education level: Not on file  Occupational History  . Not on file  Social Needs  . Financial resource strain: Not on file  . Food insecurity:    Worry: Not on file    Inability: Not on file  . Transportation needs:    Medical: Not on file    Non-medical: Not on file  Tobacco Use  . Smoking status: Current Some Day Smoker    Packs/day: 0.30    Types: Cigarettes  . Smokeless tobacco: Never Used  Substance and Sexual Activity  . Alcohol use: Yes    Comment: beer occasionally  . Drug use: Yes    Types: Marijuana, Cocaine    Comment: Pt stated "I have a long hx of drug abuse."  . Sexual activity: Yes    Birth control/protection: None  Lifestyle  . Physical activity:    Days per week: Not on file    Minutes per session: Not on file  . Stress: Not on file  Relationships  . Social connections:    Talks on phone: Not on file    Gets together: Not on file    Attends religious service: Not on file    Active member of club or organization: Not on file    Attends meetings of clubs or organizations: Not on file    Relationship status: Not on file  Other Topics Concern  . Not on file  Social History Narrative  . Not on file   Additional Social History:                         Sleep: Good  Appetite:  Good  Current Medications: Current Facility-Administered Medications  Medication Dose Route Frequency Provider Last Rate Last Dose  . acetaminophen (TYLENOL) tablet 650 mg  650 mg Oral Q6H PRN Clapacs, John T, MD      . acyclovir (ZOVIRAX) 200 MG capsule 400 mg  400 mg Oral BID Clapacs, Jackquline Denmark, MD   400 mg at 05/15/17 0806  . alum & mag hydroxide-simeth (MAALOX/MYLANTA) 200-200-20 MG/5ML suspension 30 mL  30 mL Oral Q4H PRN Clapacs, John T, MD      . baclofen (LIORESAL) tablet 10 mg  10 mg Oral BID PRN Beverly Sessions, MD   10 mg at 05/15/17 1610  . divalproex (DEPAKOTE) DR tablet  500 mg  500 mg Oral Q12H Melchor Kirchgessner, Ileene Hutchinson, MD   500 mg at 05/15/17 0806  . gabapentin (NEURONTIN) capsule 900 mg  900 mg Oral TID Haskell Riling, MD   900 mg at 05/15/17 9604  . hydrOXYzine (ATARAX/VISTARIL) tablet 50 mg  50 mg Oral TID PRN Clapacs, Jackquline Denmark, MD      . hydrOXYzine (ATARAX/VISTARIL) tablet 50 mg  50 mg Oral BID Clapacs, Jackquline Denmark, MD   50 mg at 05/15/17 0806  . lurasidone (LATUDA) tablet 20 mg  20 mg Oral Q supper Maron Stanzione, Ileene Hutchinson, MD      . magnesium hydroxide (MILK OF MAGNESIA) suspension 30 mL  30 mL Oral Daily PRN Clapacs, John T, MD      . multivitamin with minerals tablet 1 tablet  1 tablet Oral Daily Jawara Latorre, Ileene Hutchinson, MD   1 tablet at 05/15/17 0806  . thiamine (VITAMIN B-1) tablet 100 mg  100 mg Oral Daily Clapacs, John T, MD   100 mg at 05/15/17 5409   Or  . thiamine (B-1) injection 100 mg  100 mg Intravenous Daily Clapacs, John T, MD      . traZODone (DESYREL) tablet 100 mg  100 mg Oral QHS PRN Clapacs, Jackquline Denmark, MD   100 mg at 05/14/17 2241  . venlafaxine XR (EFFEXOR-XR) 24 hr capsule 75 mg  75 mg Oral Q breakfast Ramar Nobrega, Ileene Hutchinson, MD   75 mg at 05/15/17 8119    Lab Results: No results found for this or any previous visit (from the past 48 hour(s)).  Blood Alcohol level:  Lab Results  Component Value Date   ETH <10 05/09/2017   ETH <10 03/11/2017    Metabolic Disorder Labs: Lab Results  Component Value Date   HGBA1C 5.8 (H) 05/11/2017   MPG 119.76 05/11/2017   No results found for: PROLACTIN Lab Results  Component Value Date   CHOL 163 05/11/2017   TRIG 41 05/11/2017   HDL 59 05/11/2017   CHOLHDL 2.8 05/11/2017   VLDL 8 05/11/2017   LDLCALC 96 05/11/2017   LDLCALC 129 (H) 12/04/2016    Physical Findings: AIMS: Facial and Oral Movements Muscles of Facial Expression: None, normal Lips and Perioral Area: None, normal Jaw: None, normal Tongue: None, normal,Extremity Movements Upper (arms, wrists, hands, fingers): None, normal Lower (legs, knees, ankles, toes):  None, normal, Trunk Movements Neck, shoulders, hips: None, normal, Overall Severity Severity of abnormal movements (highest score from questions above): None, normal Incapacitation due  to abnormal movements: None, normal Patient's awareness of abnormal movements (rate only patient's report): No Awareness, Dental Status Current problems with teeth and/or dentures?: No Does patient usually wear dentures?: No  CIWA:  CIWA-Ar Total: 0 COWS:  COWS Total Score: 2  Musculoskeletal: Strength & Muscle Tone: within normal limits Gait & Station: normal Patient leans: N/A  Psychiatric Specialty Exam: Physical Exam  Nursing note and vitals reviewed.   Review of Systems  All other systems reviewed and are negative.   Blood pressure 121/83, pulse 86, temperature 98 F (36.7 C), temperature source Oral, resp. rate 16, height  (1.702 m), weight 75.8 kg (167 lb), SpO2 98 %.Body mass index is 26.16 kg/m.  General Appearance: Casual  Eye Contact:  Good  Speech:  Clear and Coherent  Volume:  Normal  Mood:  Euthymic  Affect:  Congruent  Thought Process:  Coherent and Goal Directed  Orientation:  Full (Time, Place, and Person)  Thought Content:  Logical  Suicidal Thoughts:  No  Homicidal Thoughts:  No  Memory:  Immediate;   Fair  Judgement:  Fair  Insight:  Fair  Psychomotor Activity:  Normal  Concentration:  Concentration: Fair  Recall:  [employer] of Knowledge:  Fair  Language:  Fair  Akathisia:  No      Assets:  Resilience  ADL's:  Intact  Cognition:  WNL  Sleep:  Number of Hours: 7.75     Treatment Plan Summary: 42 yo male admitted due to SI. Mood is improving and SI has resolved. He is motivated for treatment and sobriety.   Plan:  Mood -Decrease Latuda to 20 mg daily due to feeling restless with higher dose -Continue Depakote 500 mg BID -Continue Effexor XR 75 mg daily -Continue Gabapentin 900 mg TID  alcohol and crack use disorder -He will go to [facility] on  Wednesday     Haskell Riling, MD 05/15/2017, 12:06 PM

## 2017-05-15 NOTE — Plan of Care (Signed)
Patient up ad lib with a steady gait. Compliant with medications and meals. Present in the milieu interacting with peers appropriately. Denies SI/HI/AVH and pain at this time. Patient voiced his excitement regarding his discharge states. "I'm going to take it one day at a time. I have to accept the things that I can't change." Attends group therapy with active participation. Milieu remains safe with q 15 minute safety checks. Will continue to monitor.

## 2017-05-15 NOTE — Progress Notes (Addendum)
D: Patient denies SI/HI/AVH. Patient rates hopelessness as 3,  depression as 3, and anxiety as 5.  Patient affect is flat. Mood is anxious.  Patient states, "I'm anxious about leaving.  I want to make it out of here."  Patient did NOT attend evening group. No distress noted. A: Support and encouragement offered. Scheduled medications given to pt. Q 15 min checks continued for patient safety. R: Patient receptive. Patient remains safe on the unit.    Patient slept 7 hrs 45 min.

## 2017-05-15 NOTE — Progress Notes (Signed)
Recreation Therapy Notes  Date: 05/15/2017  Time: 9:30 am  Location: Craft Room  Behavioral response: Appropriate  Intervention Topic: Happiness  Discussion/Intervention:  Group content today was focused on Happiness. The group defined happiness and stated reasons they are and are not happy at times. Participants identified reasons they are normally happy and why. Individuals expressed how not being happy affects themselves and others. Patients stated reasons why happiness is important to them. The group described how they feel when they are happy. Individuals participated in the intervention "What is happiness" where they defined what happiness means to them.  Clinical Observations/Feedback:  Patient came to group and described happiness as being with his children and mother. He stated being with his dog also makes him happy. Individual also identified music, loyal girlfriend, cooking and shopping as things that make up his happiness. Patient stated everyone has different things that make them happy. He was social with peers and staff while participating in the intervention.  Zena Vitelli LRT/CTRS         Ferrin Liebig 05/15/2017 12:56 PM

## 2017-05-15 NOTE — [facility] Group Notes (Signed)
CSW Group Therapy Note  05/15/2017  Time:  0900  Type of Therapy and Topic: Group Therapy: Goals Group: SMART Goals    Participation Level:  Active    Description of Group:   The purpose of a daily goals group is to assist and guide patients in setting recovery/wellness-related goals. The objective is to set goals as they relate to the crisis in which they were admitted. Patients will be using SMART goal modalities to set measurable goals. Characteristics of realistic goals will be discussed and patients will be assisted in setting and processing how one will reach their goal. Facilitator will also assist patients in applying interventions and coping skills learned in psycho-education groups to the SMART goal and process how one will achieve defined goal.    Therapeutic Goals:  -Patients will develop and document one goal related to or their crisis in which brought them into treatment.  -Patients will be guided by LCSW using SMART goal setting modality in how to set a measurable, attainable, realistic and time sensitive goal.  -Patients will process barriers in reaching goal.  -Patients will process interventions in how to overcome and successful in reaching goal.    Patient's Goal:  Pt continues to work towards their tx goals but has not yet reached them. Pt was able to appropriately participate in group discussion, and was able to offer support/validation to other group members. Pt reported his goal for the day is to, "manage my anger by practicing at least 3 new skills by the end of the day."   Therapeutic Modalities:  Motivational Interviewing  Cognitive Behavioral Therapy  Crisis Intervention Model  SMART goals setting  Heidi Dach, MSW, LCSW Clinical Social Worker 05/15/2017 10:04 AM

## 2017-05-15 NOTE — Plan of Care (Signed)
Patient is resting comfortably and seeing watching television without any issues, cooperating wit his medical regimen, appetite is good and no noticeable side effects, socialize well with peers voice no concerns, educate patient on safety and nutrition, 15 minute rounding is maintained no distress noted Problem: Education: Goal: Ability to make informed decisions regarding treatment will improve Outcome: Progressing   Problem: Self-Concept: Goal: Ability to disclose and discuss suicidal ideas will improve Outcome: Progressing Goal: Will verbalize positive feelings about self Outcome: Progressing   Problem: Education: Goal: Knowledge of disease or condition will improve Outcome: Progressing Goal: Understanding of discharge needs will improve Outcome: Progressing   Problem: Health Behavior/Discharge Planning: Goal: Ability to identify changes in lifestyle to reduce recurrence of condition will improve Outcome: Progressing Goal: Identification of resources available to assist in meeting health care needs will improve Outcome: Progressing   Problem: Safety: Goal: Ability to remain free from injury will improve Outcome: Progressing   Problem: Activity: Goal: Interest or engagement in leisure activities will improve Outcome: Progressing Goal: Imbalance in normal sleep/wake cycle will improve Outcome: Progressing   Problem: Coping: Goal: Coping ability will improve Outcome: Progressing Goal: Will verbalize feelings Outcome: Progressing   Problem: Activity: Goal: Will identify at least one activity in which they can participate Outcome: Progressing   Problem: Coping: Goal: Ability to identify and develop effective coping behavior will improve Outcome: Progressing   Problem: Self-Concept: Goal: Will verbalize positive feelings about self Outcome: Progressing   Problem: Coping: Goal: Level of anxiety will decrease Outcome: Progressing   Problem: Pain Managment: Goal:  General experience of comfort will improve Outcome: Progressing   Problem: Safety: Goal: Ability to remain free from injury will improve Outcome: Progressing   Problem: Spiritual Needs Goal: Ability to function at adequate level Outcome: Progressing

## 2017-05-16 NOTE — Progress Notes (Signed)
  [facility] Adult Case Management Discharge Plan :  Will you be returning to the same living situation after discharge:  No. At discharge, do you have transportation home?: Yes,  Taxi to [facility] Do you have the ability to pay for your medications: Yes,  Insurance  Release of information consent forms completed and in the chart;  Patient's signature needed at discharge.  Patient to Follow up at: Follow-up Information    [facility]. Go on 05/16/2017.   Specialty:  Behavioral Health Why:  You are scheduled to attend treatment on Wednesday May 16, 2017 at 11am. Thank you. Contact information: [address] [place] [address] [phone]          Next level of care provider has access to [facility] Link:no  Safety Planning and Suicide Prevention discussed: Yes,  Completed with patient  Have you used any form of tobacco in the last 30 days? (Cigarettes, Smokeless Tobacco, Cigars, and/or Pipes): Yes  Has patient been referred to the Quitline?: Patient refused referral  Patient has been referred for addiction treatment: Yes  [name], LCSW 05/16/2017, 8:15 AM

## 2017-05-16 NOTE — Progress Notes (Signed)
Recreation Therapy Notes  INPATIENT RECREATION TR PLAN  Patient Details Name: Danny Lester MRN: [id number] DOB: 03-31-1975 Today's Date: 05/16/2017  Rec Therapy Plan Is patient appropriate for Therapeutic Recreation?: Yes Treatment times per week: at least 3 Estimated Length of Stay: 5-7 days TR Treatment/Interventions: Group participation (Comment)  Discharge Criteria Pt will be discharged from therapy if:: Discharged Treatment plan/goals/alternatives discussed and agreed upon by:: Patient/family  Discharge Summary Short term goals set: Patient will identify 3 positive coping skills to decrease depressive symptoms within 5 recreation therapy group sessions Short term goals met: Adequate for discharge Progress toward goals comments: Groups attended Which groups?: Stress management, Other (Comment)(Happiness, Problem Solving) Reason goals not met: N/A Therapeutic equipment acquired: N/A Reason patient discharged from therapy: Discharge from hospital Pt/family agrees with progress & goals achieved: Yes Date patient discharged from therapy: 05/16/17   Danny Lester 05/16/2017, 1:17 PM

## 2017-05-16 NOTE — Discharge Summary (Signed)
Physician Discharge Summary Note  Patient:  Danny Lester is an 42 y.o., male MRN:  [id number] DOB:  Apr 24, 1975 Patient phone:  [phone] (home)  Patient address:   [address] [place] [address],  Total Time spent with patient: 15 minutes  Plus 20 minutes of medication reconciliation, discharge planning, and discharge documentation   Date of Admission:  05/10/2017 Date of Discharge: 05/16/17  Reason for Admission:  Suicidal thoughts  Principal Problem: Severe recurrent major depression without psychotic features [facility]) Discharge Diagnoses: Patient Active Problem List   Diagnosis Date Noted  . Severe recurrent major depression without psychotic features (HCC) [F33.2] 05/10/2017    Priority: High  . Alcohol abuse [F10.10] 05/10/2017  . Cocaine abuse (HCC) [F14.10] 03/12/2017  . Adjustment disorder with depressed mood [F43.21] 01/28/2017  . Pelvic fracture (HCC) [S32.9XXA] 01/02/2017  . Herpes simplex [B00.9] 02/11/2016  . Leukocytosis [D72.829] 12/01/2015  . Carpal tunnel syndrome, bilateral [G56.03] 09/28/2015  . Plantar fasciitis, bilateral [M72.2] 09/28/2015    Past Psychiatric History: See H&P  Past Medical History:  Past Medical History:  Diagnosis Date  . Anxiety   . Broken arm   . Depression   . 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 Nerve pain   . PTSD (post-traumatic stress disorder)   . Schizophrenia, paranoid, chronic (HCC)     Past Surgical History:  Procedure Laterality Date  . KNEE SURGERY     Family History: History reviewed. No pertinent family history. Family Psychiatric  History: See H&P Social History:  Social History   Substance and Sexual Activity  Alcohol Use Yes   Comment: beer occasionally     Social History   Substance and Sexual Activity  Drug Use Yes  . Types: Marijuana, Cocaine   Comment: Pt stated "I have a long hx of drug abuse."    Social History   Socioeconomic History  . Marital status: Single     Spouse name: Not on file  . Number of children: Not on file  . Years of education: Not on file  . Highest education level: Not on file  Occupational History  . Not on file  Social Needs  . Financial resource strain: Not on file  . Food insecurity:    Worry: Not on file    Inability: Not on file  . Transportation needs:    Medical: Not on file    Non-medical: Not on file  Tobacco Use  . Smoking status: Current Some Day Smoker    Packs/day: 0.30    Types: Cigarettes  . Smokeless tobacco: Never Used  Substance and Sexual Activity  . Alcohol use: Yes    Comment: beer occasionally  . Drug use: Yes    Types: Marijuana, Cocaine    Comment: Pt stated "I have a long hx of drug abuse."  . Sexual activity: Yes    Birth control/protection: None  Lifestyle  . Physical activity:    Days per week: Not on file    Minutes per session: Not on file  . Stress: Not on file  Relationships  . Social connections:    Talks on phone: Not on file    Gets together: Not on file    Attends religious service: Not on file    Active member of club or organization: Not on file    Attends meetings of clubs or organizations: Not on file    Relationship status: Not on file  Other Topics Concern  . Not on file  Social History Narrative  . Not on file    Hospital Course:  Pt was restarted on home medications. Latuda was decreased as he had been off of it for some time and making him jittery. He was stable on 20 mg daily. He attended roups and very active in groups. He was very motivated for treatment. HE was referred to [facility] and was accepted. On day of discharge, he reported feeling much more hopeful and ready for treatment. He denies SI or any thoughts of self harm. He is ready for a fresh start and to get back on his feet. His affect was bright and smiling. He was very thankful for all the help he was given. He was sleeping better and eating well. Denied SI, HI, AH, VH.   The patient is at low risk of  imminent suicide. Patient denied thoughts, intent, or plan for harm to self or others, expressed significant future orientation, and expressed an ability to mobilize assistance for his needs. he is presently void of any contributing psychiatric symptoms, cognitive difficulties, or substance use which would elevate his risk for lethality. Chronic risk for lethality is elevated in light of substance use, male gender. The chronic risk is presently mitigated by his ongoing desire and engagement in [facility] treatment and mobilization of support from family and friends. Chronic risk may elevate if he experiences any significant loss or worsening of symptoms, which can be managed and monitored through outpatient providers. At this time,a cute risk for lethality is low and he is stable for ongoing outpatient management.   Modifiable risk factors were addressed during this hospitalization through appropriate pharmacotherapy and establishment of outpatient follow-up treatment. Some risk factors for suicide are situational (i.e. Unstable housing) or related personality pathology (i.e. Poor coping mechanisms) and thus cannot be further mitigated by continued hospitalization in this setting.    Physical Findings: AIMS: Facial and Oral Movements Muscles of Facial Expression: None, normal Lips and Perioral Area: None, normal Jaw: None, normal Tongue: None, normal,Extremity Movements Upper (arms, wrists, hands, fingers): None, normal Lower (legs, knees, ankles, toes): None, normal, Trunk Movements Neck, shoulders, hips: None, normal, Overall Severity Severity of abnormal movements (highest score from questions above): None, normal Incapacitation due to abnormal movements: None, normal Patient's awareness of abnormal movements (rate only patient's report): No Awareness, Dental Status Current problems with teeth and/or dentures?: No Does patient usually wear dentures?: No  CIWA:  CIWA-Ar Total: 0 COWS:  COWS Total  Score: 2  Musculoskeletal: Strength & Muscle Tone: within normal limits Gait & Station: normal Patient leans: N/A  Psychiatric Specialty Exam: Physical Exam  Nursing note and vitals reviewed.   Review of Systems  All other systems reviewed and are negative.   Blood pressure 126/80, pulse 84, temperature 98.7 F (37.1 C), temperature source Oral, resp. rate 18, height  (1.702 m), weight 75.8 kg (167 lb), SpO2 99 %.Body mass index is 26.16 kg/m.  General Appearance: Casual  Eye Contact:  Good  Speech:  Clear and Coherent  Volume:  Normal  Mood:  Euthymic  Affect:  Appropriate  Thought Process:  Coherent and Goal Directed  Orientation:  Full (Time, Place, and Person)  Thought Content:  Logical  Suicidal Thoughts:  No  Homicidal Thoughts:  No  Memory:  Immediate;   Fair  Judgement:  Fair  Insight:  Fair  Psychomotor Activity:  Normal  Concentration:  Attention Span: Fair  Recall:  [employer] of Knowledge:  Fair  Language:  Fair  Akathisia:  No      Assets:  Resilience  ADL's:  Intact  Cognition:  WNL  Sleep:  Number of Hours: 7.15     Have you used any form of tobacco in the last 30 days? (Cigarettes, Smokeless Tobacco, Cigars, and/or Pipes): Yes  Has this patient used any form of tobacco in the last 30 days? (Cigarettes, Smokeless Tobacco, Cigars, and/or Pipes) Yes, Yes, A prescription for an FDA-approved tobacco cessation medication was offered at discharge and the patient refused  Blood Alcohol level:  Lab Results  Component Value Date   [facility] <10 05/09/2017   ETH <10 03/11/2017    Metabolic Disorder Labs:  Lab Results  Component Value Date   HGBA1C 5.8 (H) 05/11/2017   MPG 119.76 05/11/2017   No results found for: PROLACTIN Lab Results  Component Value Date   CHOL 163 05/11/2017   TRIG 41 05/11/2017   HDL 59 05/11/2017   CHOLHDL 2.8 05/11/2017   VLDL 8 05/11/2017   LDLCALC 96 05/11/2017   LDLCALC 129 (H) 12/04/2016    See Psychiatric  Specialty Exam and Suicide Risk Assessment completed by Attending Physician prior to discharge.  Discharge destination:  [facility]  Is patient on multiple antipsychotic therapies at discharge:  No   Has Patient had three or more failed trials of antipsychotic monotherapy by history:  No  Recommended Plan for Multiple Antipsychotic Therapies: NA  Discharge Instructions    Increase activity slowly   Complete by:  As directed      Allergies as of 05/16/2017      Reactions   Penicillins Hives, Swelling   Swelling of face Has patient had a PCN reaction causing immediate rash, facial/tongue/throat swelling, SOB or lightheadedness with hypotension: Yes Has patient had a PCN reaction causing severe rash involving mucus membranes or skin necrosis: Yes Has patient had a PCN reaction that required hospitalization No Has patient had a PCN reaction occurring within the last 10 years: No If all of the above answers are "NO", then may proceed with Cephalosporin use.   Pork-derived Products    Risperdal [risperidone] Other (See Comments)   Per pt: "starts shaking on one side"      Medication List    STOP taking these medications   gabapentin 600 MG tablet Commonly known as:  NEURONTIN Replaced by:  gabapentin 300 MG capsule     TAKE these medications     Indication  acyclovir 400 MG tablet Commonly known as:  ZOVIRAX 1 tab by mouth twice daily  Indication:  HSV   baclofen 10 MG tablet Commonly known as:  LIORESAL 1/2 tab by mouth twice daily only as needed  Indication:  Muscle Spasticity   divalproex 500 MG DR tablet Commonly known as:  DEPAKOTE Take 1 tablet (500 mg total) by mouth 2 (two) times daily. Take 2 tablets every morning and take 1 tablet every evening What changed:  how much to take  Indication:  Mood stabilization   gabapentin 300 MG capsule Commonly known as:  NEURONTIN Take 3 capsules (900 mg total) by mouth 3 (three) times daily. Replaces:  gabapentin 600 MG  tablet  Indication:  Neuropathic Pain   hydrOXYzine 25 MG tablet Commonly known as:  ATARAX/VISTARIL Take 1 tablet (25 mg total) by mouth 3 (three) times daily as needed for anxiety.  Indication:  Feeling Anxious   lurasidone 20 MG Tabs tablet Commonly known as:  LATUDA Take 1 tablet (20 mg total) by mouth daily with supper. What  changed:    medication strength  how much to take  when to take this  Indication:  Mood   multivitamin with minerals Tabs tablet Take 1 tablet by mouth daily.  Indication:  Vitamins   thiamine 100 MG tablet Take 1 tablet (100 mg total) by mouth daily.  Indication:  Replacement   traZODone 150 MG tablet Commonly known as:  DESYREL Take 150 mg by mouth at bedtime as needed for sleep.  Indication:  Trouble Sleeping   venlafaxine XR 75 MG 24 hr capsule Commonly known as:  EFFEXOR-XR Take 75 mg by mouth every morning.  Indication:  Major Depressive Disorder      Follow-up Information    [facility]. Go on 05/16/2017.   Specialty:  Behavioral Health Why:  You are scheduled to attend treatment on Wednesday May 16, 2017 at 11am. Thank you. Contact information: [address] [place] [address] [phone]          Follow-up recommendations: [facility]  Signed: Haskell Riling, MD 05/16/2017, 3:03 PM

## 2017-05-16 NOTE — Progress Notes (Signed)
Patient alert and oriented x 4. Ambulates the unit with steady gait. Currently denies SI/HI/AVH and pain at this time. Compliant with medication and meals and appropriately interacts well with his peers and actively participates. Patient discharged on above date and time transported to [facility] via [employer] cab. Cab was running behind schdule this morning, this Clinical research associate notified patient's Child psychotherapist of such. The social worker notified [facility] of patient's anticipated late arrival. No complaints voiced.

## 2017-05-16 NOTE — Progress Notes (Signed)
Recreation Therapy Notes   Date: 05/16/2017  Time: 9:30 am  Location: Craft Room  Behavioral response: Appropriate   Intervention Topic: Stress  Discussion/Intervention:  Group content on today was focused on stress. The group defined stress and ways to cope with stress. Participants expressed how they know when they are stresses out and certain triggers that stress them out. Individuals described the different ways they have to cope with stress. The group stated reasons why it is important to cope with stress. Patient explained what good stress is and some examples. The group participated in the intervention "Managing Stress". Individuals were able to identify new ways to cope with stress. Clinical Observations/Feedback:  Patient came to group and was focused on what his peers and staff had to say about stress. Individual was pulled form group to take medicine and never returned to group.  Yanitza Shvartsman LRT/CTRS         Jb Dulworth 05/16/2017 1:14 PM

## 2017-05-16 NOTE — [facility]HH Suicide Risk Assessment (Signed)
[facility] Discharge Suicide Risk Assessment   Principal Problem: Severe recurrent major depression without psychotic features [facility]) Discharge Diagnoses:  Patient Active Problem List   Diagnosis Date Noted  . Severe recurrent major depression without psychotic features (HCC) [F33.2] 05/10/2017    Priority: High  . Alcohol abuse [F10.10] 05/10/2017  . Cocaine abuse (HCC) [F14.10] 03/12/2017  . Adjustment disorder with depressed mood [F43.21] 01/28/2017  . Pelvic fracture (HCC) [S32.9XXA] 01/02/2017  . Herpes simplex [B00.9] 02/11/2016  . Leukocytosis [D72.829] 12/01/2015  . Carpal tunnel syndrome, bilateral [G56.03] 09/28/2015  . Plantar fasciitis, bilateral [M72.2] 09/28/2015    Mental Status Per Nursing Assessment::   On Admission:  Suicidal ideation indicated by patient  Demographic Factors:  Male, Low socioeconomic status and Unemployed  Loss Factors: Legal issues and Financial problems/change in socioeconomic status  Historical Factors: Impulsivity  Risk Reduction Factors:   Positive social support, Positive therapeutic relationship and Positive coping skills or problem solving skills  Continued Clinical Symptoms:  Alcohol/Substance Abuse/Dependencies  Cognitive Features That Contribute To Risk:  None    Suicide Risk:  Minimal: No identifiable suicidal ideation.    Follow-up Information    [facility]. Go on 05/16/2017.   Specialty:  Behavioral Health Why:  You are scheduled to attend treatment on Wednesday May 16, 2017 at 11am. Thank you. Contact information: [address] [name] [address] [phone]          Plan Of Care/Follow-up recommendations: [facility]  Haskell Riling, MD 05/16/2017, 8:51 AM

## 2017-05-30 ENCOUNTER — Encounter: Payer: Self-pay | Admitting: Internal Medicine
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
